# Patient Record
Sex: Female | Born: 1950 | Race: White | Hispanic: No | Marital: Married
Health system: Midwestern US, Community
[De-identification: ages and names within clinical notes are randomized; demographics above are authoritative.]

## PROBLEM LIST (undated history)

## (undated) DIAGNOSIS — G43909 Migraine, unspecified, not intractable, without status migrainosus: Secondary | ICD-10-CM

## (undated) DIAGNOSIS — R002 Palpitations: Secondary | ICD-10-CM

## (undated) DIAGNOSIS — E785 Hyperlipidemia, unspecified: Secondary | ICD-10-CM

## (undated) DIAGNOSIS — I340 Nonrheumatic mitral (valve) insufficiency: Secondary | ICD-10-CM

## (undated) DIAGNOSIS — K589 Irritable bowel syndrome without diarrhea: Secondary | ICD-10-CM

## (undated) DIAGNOSIS — I10 Essential (primary) hypertension: Secondary | ICD-10-CM

## (undated) DIAGNOSIS — E039 Hypothyroidism, unspecified: Secondary | ICD-10-CM

## (undated) DIAGNOSIS — H6090 Unspecified otitis externa, unspecified ear: Secondary | ICD-10-CM

## (undated) DIAGNOSIS — M797 Fibromyalgia: Secondary | ICD-10-CM

## (undated) DIAGNOSIS — M1990 Unspecified osteoarthritis, unspecified site: Secondary | ICD-10-CM

## (undated) DIAGNOSIS — E669 Obesity, unspecified: Secondary | ICD-10-CM

## (undated) DIAGNOSIS — G459 Transient cerebral ischemic attack, unspecified: Secondary | ICD-10-CM

## (undated) DIAGNOSIS — K200 Eosinophilic esophagitis: Secondary | ICD-10-CM

## (undated) DIAGNOSIS — K5792 Diverticulitis of intestine, part unspecified, without perforation or abscess without bleeding: Secondary | ICD-10-CM

## (undated) DIAGNOSIS — K219 Gastro-esophageal reflux disease without esophagitis: Secondary | ICD-10-CM

## (undated) HISTORY — DX: Unspecified osteoarthritis, unspecified site: M19.90

## (undated) HISTORY — DX: Diverticulitis of intestine, part unspecified, without perforation or abscess without bleeding: K57.92

## (undated) HISTORY — DX: Gastro-esophageal reflux disease without esophagitis: K21.9

## (undated) HISTORY — DX: Essential (primary) hypertension: I10

## (undated) HISTORY — DX: Obesity, unspecified: E66.9

## (undated) HISTORY — DX: Eosinophilic esophagitis: K20.0

## (undated) HISTORY — PX: OTHER SURGICAL HISTORY: SHX169

## (undated) HISTORY — DX: Fibromyalgia: M79.7

## (undated) HISTORY — DX: Unspecified otitis externa, unspecified ear: H60.90

## (undated) HISTORY — DX: Nonrheumatic mitral (valve) insufficiency: I34.0

## (undated) HISTORY — PX: TONSILLECTOMY AND ADENOIDECTOMY: SUR1326

## (undated) HISTORY — DX: Hypothyroidism, unspecified: E03.9

## (undated) HISTORY — PX: ABDOMINAL HYSTERECTOMY: SHX81

## (undated) HISTORY — DX: Irritable bowel syndrome, unspecified: K58.9

## (undated) HISTORY — DX: Hyperlipidemia, unspecified: E78.5

---

## 2005-12-29 ENCOUNTER — Encounter: Admission: RE | Admit: 2005-12-29 | Discharge: 2005-12-29 | Payer: Self-pay | Admitting: Family Medicine

## 2006-07-07 ENCOUNTER — Encounter: Admission: RE | Admit: 2006-07-07 | Discharge: 2006-07-07 | Payer: Self-pay | Admitting: Family Medicine

## 2008-04-22 ENCOUNTER — Encounter: Admission: RE | Admit: 2008-04-22 | Discharge: 2008-06-09 | Payer: Self-pay | Admitting: Family Medicine

## 2008-10-09 ENCOUNTER — Encounter: Admission: RE | Admit: 2008-10-09 | Discharge: 2008-10-09 | Payer: Self-pay | Admitting: Family Medicine

## 2009-03-11 ENCOUNTER — Inpatient Hospital Stay (HOSPITAL_COMMUNITY): Admission: EM | Admit: 2009-03-11 | Discharge: 2009-03-12 | Payer: Self-pay | Admitting: Emergency Medicine

## 2009-03-12 ENCOUNTER — Encounter (INDEPENDENT_AMBULATORY_CARE_PROVIDER_SITE_OTHER): Payer: Self-pay | Admitting: Internal Medicine

## 2009-03-17 ENCOUNTER — Encounter: Admission: RE | Admit: 2009-03-17 | Discharge: 2009-03-17 | Payer: Self-pay | Admitting: Family Medicine

## 2009-03-23 ENCOUNTER — Encounter: Admission: RE | Admit: 2009-03-23 | Discharge: 2009-03-23 | Payer: Self-pay | Admitting: Family Medicine

## 2010-05-01 ENCOUNTER — Other Ambulatory Visit: Payer: Self-pay | Admitting: Gastroenterology

## 2010-05-17 ENCOUNTER — Other Ambulatory Visit: Payer: Self-pay

## 2010-07-13 LAB — POCT I-STAT, CHEM 8
BUN: 13 mg/dL (ref 6–23)
Calcium, Ion: 1.16 mmol/L (ref 1.12–1.32)
Chloride: 105 mEq/L (ref 96–112)
Creatinine, Ser: 0.8 mg/dL (ref 0.4–1.2)
Glucose, Bld: 96 mg/dL (ref 70–99)
HCT: 41 % (ref 36.0–46.0)
Hemoglobin: 13.9 g/dL (ref 12.0–15.0)
Potassium: 4.1 mEq/L (ref 3.5–5.1)
Sodium: 140 mEq/L (ref 135–145)
TCO2: 26 mmol/L (ref 0–100)

## 2010-07-13 LAB — CK TOTAL AND CKMB (NOT AT ARMC)
CK, MB: 0.7 ng/mL (ref 0.3–4.0)
Total CK: 61 U/L (ref 7–177)

## 2010-07-13 LAB — CBC
HCT: 39.5 % (ref 36.0–46.0)
Hemoglobin: 13.8 g/dL (ref 12.0–15.0)
MCHC: 35.1 g/dL (ref 30.0–36.0)
MCV: 91.1 fL (ref 78.0–100.0)
Platelets: 197 10*3/uL (ref 150–400)
RBC: 4.33 MIL/uL (ref 3.87–5.11)
RDW: 12.8 % (ref 11.5–15.5)
WBC: 7.7 10*3/uL (ref 4.0–10.5)

## 2010-07-13 LAB — HEMOGLOBIN A1C: Mean Plasma Glucose: 123 mg/dL

## 2010-07-13 LAB — POCT CARDIAC MARKERS
CKMB, poc: <1 ng/mL — ABNORMAL LOW (ref 1.0–8.0)
CKMB, poc: <1 ng/mL — ABNORMAL LOW (ref 1.0–8.0)
Myoglobin, poc: 17.8 ng/mL (ref 12–200)
Myoglobin, poc: 22.6 ng/mL (ref 12–200)
Troponin i, poc: <0.05 ng/mL (ref 0.00–0.09)
Troponin i, poc: <0.05 ng/mL (ref 0.00–0.09)

## 2010-07-13 LAB — LIPID PANEL
HDL: 31 mg/dL — ABNORMAL LOW (ref >39)
Total CHOL/HDL Ratio: 7.3 RATIO
Triglycerides: 201 mg/dL — ABNORMAL HIGH (ref <150)
VLDL: 40 mg/dL (ref 0–40)

## 2010-07-13 LAB — DIFFERENTIAL
Basophils Absolute: 0.1 10*3/uL (ref 0.0–0.1)
Basophils Relative: 1 % (ref 0–1)
Eosinophils Absolute: 0.4 10*3/uL (ref 0.0–0.7)
Eosinophils Relative: 5 % (ref 0–5)
Lymphocytes Relative: 26 % (ref 12–46)
Lymphs Abs: 2 10*3/uL (ref 0.7–4.0)
Monocytes Absolute: 0.5 10*3/uL (ref 0.1–1.0)
Monocytes Relative: 6 % (ref 3–12)
Neutro Abs: 4.8 10*3/uL (ref 1.7–7.7)
Neutrophils Relative %: 62 % (ref 43–77)

## 2010-07-13 LAB — D-DIMER, QUANTITATIVE

## 2010-07-13 LAB — TSH: TSH: 15.239 u[IU]/mL — ABNORMAL HIGH (ref 0.350–4.500)

## 2010-07-13 LAB — TROPONIN I

## 2011-02-11 ENCOUNTER — Other Ambulatory Visit: Payer: Self-pay | Admitting: Family Medicine

## 2011-02-11 DIAGNOSIS — G44009 Cluster headache syndrome, unspecified, not intractable: Secondary | ICD-10-CM

## 2011-02-16 ENCOUNTER — Encounter: Payer: Self-pay | Admitting: Family Medicine

## 2011-02-16 ENCOUNTER — Emergency Department (HOSPITAL_BASED_OUTPATIENT_CLINIC_OR_DEPARTMENT_OTHER)
Admission: EM | Admit: 2011-02-16 | Discharge: 2011-02-16 | Disposition: A | Discharge status: Home / Self Care | Payer: PRIVATE HEALTH INSURANCE | Attending: Emergency Medicine | Admitting: Emergency Medicine

## 2011-02-16 DIAGNOSIS — R109 Unspecified abdominal pain: Secondary | ICD-10-CM | POA: Insufficient documentation

## 2011-02-16 DIAGNOSIS — R197 Diarrhea, unspecified: Secondary | ICD-10-CM | POA: Insufficient documentation

## 2011-02-16 HISTORY — DX: Migraine, unspecified, not intractable, without status migrainosus: G43.909

## 2011-02-16 HISTORY — DX: Palpitations: R00.2

## 2011-02-16 HISTORY — DX: Transient cerebral ischemic attack, unspecified: G45.9

## 2011-02-16 LAB — URINALYSIS, ROUTINE W REFLEX MICROSCOPIC
Bilirubin Urine: NEGATIVE
Glucose, UA: NEGATIVE mg/dL
Hgb urine dipstick: NEGATIVE
Ketones, ur: NEGATIVE mg/dL
Nitrite: NEGATIVE
Protein, ur: NEGATIVE mg/dL
Specific Gravity, Urine: 1.008 (ref 1.005–1.030)
Urobilinogen, UA: 0.2 mg/dL (ref 0.0–1.0)
pH: 7 (ref 5.0–8.0)

## 2011-02-16 LAB — COMPREHENSIVE METABOLIC PANEL
ALT: 16 U/L (ref 0–35)
AST: 12 U/L (ref 0–37)
Albumin: 3.7 g/dL (ref 3.5–5.2)
Alkaline Phosphatase: 75 U/L (ref 39–117)
BUN: 14 mg/dL (ref 6–23)
CO2: 27 mEq/L (ref 19–32)
Calcium: 8.9 mg/dL (ref 8.4–10.5)
Chloride: 104 mEq/L (ref 96–112)
Creatinine, Ser: 0.8 mg/dL (ref 0.50–1.10)
GFR calc Af Amer: >90 mL/min (ref >90)
GFR calc non Af Amer: 80 mL/min — ABNORMAL LOW (ref >90)
Glucose, Bld: 101 mg/dL — ABNORMAL HIGH (ref 70–99)
Potassium: 3.9 mEq/L (ref 3.5–5.1)
Sodium: 137 mEq/L (ref 135–145)
Total Bilirubin: 0.2 mg/dL — ABNORMAL LOW (ref 0.3–1.2)
Total Protein: 7.2 g/dL (ref 6.0–8.3)

## 2011-02-16 LAB — URINE MICROSCOPIC-ADD ON

## 2011-02-16 LAB — DIFFERENTIAL
Basophils Absolute: 0.1 10*3/uL (ref 0.0–0.1)
Basophils Relative: 1 % (ref 0–1)
Eosinophils Absolute: 0.8 10*3/uL — ABNORMAL HIGH (ref 0.0–0.7)
Eosinophils Relative: 9 % — ABNORMAL HIGH (ref 0–5)
Lymphocytes Relative: 33 % (ref 12–46)
Lymphs Abs: 2.9 10*3/uL (ref 0.7–4.0)
Monocytes Absolute: 0.7 10*3/uL (ref 0.1–1.0)
Monocytes Relative: 8 % (ref 3–12)
Neutro Abs: 4.2 10*3/uL (ref 1.7–7.7)
Neutrophils Relative %: 49 % (ref 43–77)

## 2011-02-16 LAB — CBC
HCT: 35.2 % — ABNORMAL LOW (ref 36.0–46.0)
Hemoglobin: 12 g/dL (ref 12.0–15.0)
MCH: 30.2 pg (ref 26.0–34.0)
MCHC: 34.1 g/dL (ref 30.0–36.0)
MCV: 88.4 fL (ref 78.0–100.0)
Platelets: 217 10*3/uL (ref 150–400)
RBC: 3.98 MIL/uL (ref 3.87–5.11)
RDW: 12.2 % (ref 11.5–15.5)
WBC: 8.7 10*3/uL (ref 4.0–10.5)

## 2011-02-16 LAB — LIPASE, BLOOD: Lipase: 45 U/L (ref 11–59)

## 2011-02-16 MED ORDER — SODIUM CHLORIDE 0.9 % IV BOLUS (SEPSIS)
500.0000 mL | Freq: Once | INTRAVENOUS | Status: AC
  Administered 2011-02-16: 16:00:00 via INTRAVENOUS

## 2011-02-16 MED ORDER — SODIUM CHLORIDE 0.9 % IV BOLUS (SEPSIS): 500.0000 mL | Freq: Once | INTRAVENOUS | Status: DC

## 2011-02-16 MED ORDER — ONDANSETRON HCL 4 MG/2ML IJ SOLN
4.0000 mg | Freq: Once | INTRAMUSCULAR | Status: AC
  Administered 2011-02-16: 4 mg via INTRAVENOUS
  Filled 2011-02-16: qty 2

## 2011-02-16 NOTE — ED Notes (Signed)
D/c home with family. No rx given 

## 2011-02-16 NOTE — ED Notes (Addendum)
Pt c/o "watery diarrhea since Thursday night". Pt c/o LUQ tenderness and discomfort. Pt sts diarrhea has improved but "now I feel like I am going to pass out and I'm weak". Dr. Zachery Dauer sent pt to ED for further eval and hydration. Pt sts she had a TIA recently and is scheduled for an MRI tomorrow.

## 2011-02-16 NOTE — ED Notes (Signed)
Warm blankets provided.

## 2011-02-16 NOTE — ED Provider Notes (Addendum)
History     CSN: [id number] Arrival date & time: 02/16/2011  3:45 PM   First MD Initiated Contact with Patient 02/16/11 1551      Chief Complaint  Patient presents with  . Diarrhea  . Abdominal Pain   Patient reports diarrhea over the past several days, worse over the weekend, described as brown and watery, without blood or mucus. The diarrhea has actually eased up and the patient has had no vomiting. However, she states she now feels weak and states that her "legs feel like lead" sharp. Primary care doctor today who instructed her to come to the ER, that she may need hydration. Patient reports subjective fevers at home. She's had no recent travel. However, she was in the hospital last week in [place] where she was treated for a "TIA" patient states her pain is currently 5/10. It is mostly in the left upper quadrant in the lower middle abdomen. She has had a previous hysterectomy in the past (Consider location/radiation/quality/duration/timing/severity/associated sxs/prior treatment) Patient is a 60 y.o. female presenting with diarrhea and abdominal pain.  Diarrhea The primary symptoms include abdominal pain and diarrhea.  Abdominal Pain The primary symptoms of the illness include abdominal pain and diarrhea.    History reviewed. No pertinent past medical history.  No past surgical history on file.  No family history on file.  History  Substance Use Topics  . Smoking status: Not on file  . Smokeless tobacco: Not on file  . Alcohol Use: Not on file    OB History    Grav Para Term Preterm Abortions TAB SAB Ect Mult Living                  Review of Systems  Gastrointestinal: Positive for abdominal pain and diarrhea.  All other systems reviewed and are negative.     Allergies  Review of patient's allergies indicates not on file.  Home Medications  No current outpatient prescriptions on file.  BP 125/80  Pulse 72  Temp 98.2 F (36.8 C)  Resp 16  Ht 5\' 5"   (1.651 m)  Wt 215 lb (97.523 kg)  BMI 35.78 kg/m2  SpO2 100%  Physical Exam  Constitutional: She is oriented to person, place, and time. She appears well-developed and well-nourished. No distress.  HENT:  Head: Normocephalic and atraumatic.       Mucous membranes are moist, no evidence of any cracked lips.  Eyes: Conjunctivae and EOM are normal. Pupils are equal, round, and reactive to light.  Neck: Neck supple.  Cardiovascular: Normal rate and regular rhythm.  Exam reveals no gallop and no friction rub.   No murmur heard. Pulmonary/Chest: Breath sounds normal. She has no wheezes. She has no rales. She exhibits no tenderness.  Abdominal: Soft. Bowel sounds are normal. She exhibits no distension and no mass. There is tenderness. There is no rebound and no guarding.       Mild diffuse tenderness, no rebound or guarding. No focal tenderness. Bowel sounds normal to slightly hypoactive  Musculoskeletal: Normal range of motion.  Neurological: She is alert and oriented to person, place, and time. No cranial nerve deficit. Coordination normal.  Skin: Skin is warm and dry. No rash noted.  Psychiatric: She has a normal mood and affect.    ED Course  Procedures (including critical care time)  Labs Reviewed - No data to display No results found.   No diagnosis found.    MDM  Patient is seen and examined, initial history and  physical is completed. Evaluation initiated  Previous DC summary reviewed.       Almyra Birman A. Patrica Duel, MD 02/16/11 1600  4:34 PM Results for orders placed during the hospital encounter of 02/16/11  CBC      Component Value Range   WBC 8.7  4.0 - 10.5 (K/uL)   RBC 3.98  3.87 - 5.11 (MIL/uL)   Hemoglobin 12.0  12.0 - 15.0 (g/dL)   HCT 16.1 (*) 09.6 - 46.0 (%)   MCV 88.4  78.0 - 100.0 (fL)   MCH 30.2  26.0 - 34.0 (pg)   MCHC 34.1  30.0 - 36.0 (g/dL)   RDW 04.5  40.9 - 81.1 (%)   Platelets 217  150 - 400 (K/uL)  DIFFERENTIAL      Component Value Range    Neutrophils Relative PENDING  43 - 77 (%)   Neutro Abs PENDING  1.7 - 7.7 (K/uL)   Band Neutrophils PENDING  0 - 10 (%)   Lymphocytes Relative PENDING  12 - 46 (%)   Lymphs Abs PENDING  0.7 - 4.0 (K/uL)   Monocytes Relative PENDING  3 - 12 (%)   Monocytes Absolute PENDING  0.1 - 1.0 (K/uL)   Eosinophils Relative PENDING  0 - 5 (%)   Eosinophils Absolute PENDING  0.0 - 0.7 (K/uL)   Basophils Relative PENDING  0 - 1 (%)   Basophils Absolute PENDING  0.0 - 0.1 (K/uL)   WBC Morphology PENDING     RBC Morphology PENDING     Smear Review PENDING     nRBC PENDING  0 (/100 WBC)   Metamyelocytes Relative PENDING     Myelocytes PENDING     Promyelocytes Absolute PENDING     Blasts PENDING     No results found.    Tilla Wilborn A. Patrica Duel, MD 02/16/11 1634  Results for orders placed during the hospital encounter of 02/16/11  CBC      Component Value Range   WBC 8.7  4.0 - 10.5 (K/uL)   RBC 3.98  3.87 - 5.11 (MIL/uL)   Hemoglobin 12.0  12.0 - 15.0 (g/dL)   HCT 91.4 (*) 78.2 - 46.0 (%)   MCV 88.4  78.0 - 100.0 (fL)   MCH 30.2  26.0 - 34.0 (pg)   MCHC 34.1  30.0 - 36.0 (g/dL)   RDW 95.6  21.3 - 08.6 (%)   Platelets 217  150 - 400 (K/uL)  DIFFERENTIAL      Component Value Range   Neutrophils Relative 49  43 - 77 (%)   Lymphocytes Relative 33  12 - 46 (%)   Monocytes Relative 8  3 - 12 (%)   Eosinophils Relative 9 (*) 0 - 5 (%)   Basophils Relative 1  0 - 1 (%)   Neutro Abs 4.2  1.7 - 7.7 (K/uL)   Lymphs Abs 2.9  0.7 - 4.0 (K/uL)   Monocytes Absolute 0.7  0.1 - 1.0 (K/uL)   Eosinophils Absolute 0.8 (*) 0.0 - 0.7 (K/uL)   Basophils Absolute 0.1  0.0 - 0.1 (K/uL)   Smear Review MORPHOLOGY UNREMARKABLE    COMPREHENSIVE METABOLIC PANEL      Component Value Range   Sodium 137  135 - 145 (mEq/L)   Potassium 3.9  3.5 - 5.1 (mEq/L)   Chloride 104  96 - 112 (mEq/L)   CO2 27  19 - 32 (mEq/L)   Glucose, Bld 101 (*) 70 - 99 (mg/dL)   BUN 14  6 -  23 (mg/dL)   Creatinine, Ser 1.61  0.50 - 1.10  (mg/dL)   Calcium 8.9  8.4 - 09.6 (mg/dL)   Total Protein 7.2  6.0 - 8.3 (g/dL)   Albumin 3.7  3.5 - 5.2 (g/dL)   AST 12  0 - 37 (U/L)   ALT 16  0 - 35 (U/L)   Alkaline Phosphatase 75  39 - 117 (U/L)   Total Bilirubin 0.2 (*) 0.3 - 1.2 (mg/dL)   GFR calc non Af Amer 80 (*) >90 (mL/min)   GFR calc Af Amer >90  >90 (mL/min)  LIPASE, BLOOD      Component Value Range   Lipase 45  11 - 59 (U/L)   No results found.    Hal Norrington A. Patrica Duel, MD 02/16/11 1703  5:05 PM Patient is reassessed in room, remained stable. Vital signs stable. Electrolytes normal. Will give additional fluids and reassess, awaiting urinalysis  Mariabelen Pressly A. Patrica Duel, MD 02/16/11 1705

## 2011-02-17 ENCOUNTER — Ambulatory Visit
Admission: RE | Admit: 2011-02-17 | Discharge: 2011-02-17 | Disposition: A | Discharge status: Home / Self Care | Payer: PRIVATE HEALTH INSURANCE | Source: Ambulatory Visit | Attending: Family Medicine | Admitting: Family Medicine

## 2011-02-17 DIAGNOSIS — G44009 Cluster headache syndrome, unspecified, not intractable: Secondary | ICD-10-CM

## 2011-02-17 MED ORDER — GADOBENATE DIMEGLUMINE 529 MG/ML IV SOLN
19.0000 mL | Freq: Once | INTRAVENOUS | Status: AC | PRN
  Administered 2011-02-17: 19 mL via INTRAVENOUS

## 2014-01-30 ENCOUNTER — Inpatient Hospital Stay
Admit: 2014-01-30 | Discharge: 2014-01-30 | Disposition: A | Discharge status: Home / Self Care | Payer: PRIVATE HEALTH INSURANCE | Attending: Emergency Medicine

## 2014-01-30 DIAGNOSIS — R200 Anesthesia of skin: Secondary | ICD-10-CM

## 2014-01-30 LAB — CBC WITH AUTOMATED DIFF
ABS. BASOPHILS: 0 10*3/uL (ref 0.0–0.1)
ABS. EOSINOPHILS: 0.4 10*3/uL (ref 0.0–0.4)
ABS. LYMPHOCYTES: 2.1 10*3/uL (ref 0.8–3.5)
ABS. MONOCYTES: 0.5 10*3/uL (ref 0.0–1.0)
ABS. NEUTROPHILS: 3.3 10*3/uL (ref 1.8–8.0)
BASOPHILS: 1 % (ref 0–1)
EOSINOPHILS: 6 % (ref 0–7)
HCT: 38.2 % (ref 35.0–47.0)
HGB: 13 g/dL (ref 11.5–16.0)
LYMPHOCYTES: 34 % (ref 12–49)
MCH: 30.7 PG (ref 26.0–34.0)
MCHC: 34 g/dL (ref 30.0–36.5)
MCV: 90.1 FL (ref 80.0–99.0)
MONOCYTES: 7 % (ref 5–13)
NEUTROPHILS: 52 % (ref 32–75)
PLATELET: 213 10*3/uL (ref 150–400)
RBC: 4.24 M/uL (ref 3.80–5.20)
RDW: 12.6 % (ref 11.5–14.5)
WBC: 6.4 10*3/uL (ref 3.6–11.0)

## 2014-01-30 LAB — METABOLIC PANEL, COMPREHENSIVE
A-G Ratio: 1.2 (ref 1.1–2.2)
ALT (SGPT): 28 U/L (ref 12–78)
AST (SGOT): 20 U/L (ref 15–37)
Albumin: 4.1 g/dL (ref 3.5–5.0)
Alk. phosphatase: 75 U/L (ref 45–117)
Anion gap: 9 mmol/L (ref 5–15)
BUN/Creatinine ratio: 16 (ref 12–20)
BUN: 14 MG/DL (ref 6–20)
Bilirubin, total: 0.4 MG/DL (ref 0.2–1.0)
CO2: 26 mmol/L (ref 21–32)
Calcium: 8.9 MG/DL (ref 8.5–10.1)
Chloride: 102 mmol/L (ref 97–108)
Creatinine: 0.89 MG/DL (ref 0.55–1.02)
GFR est AA: >60 mL/min/{1.73_m2} (ref >60)
GFR est non-AA: >60 mL/min/{1.73_m2} (ref >60)
Globulin: 3.5 g/dL (ref 2.0–4.0)
Glucose: 97 mg/dL (ref 65–100)
Potassium: 4.1 mmol/L (ref 3.5–5.1)
Protein, total: 7.6 g/dL (ref 6.4–8.2)
Sodium: 137 mmol/L (ref 136–145)

## 2014-01-30 LAB — CK W/ CKMB & INDEX
CK - MB: <0.5 NG/ML — ABNORMAL LOW (ref 0.5–3.6)
CK: 68 U/L (ref 26–192)

## 2014-01-30 LAB — MAGNESIUM: Magnesium: 2.1 mg/dL (ref 1.6–2.4)

## 2014-01-30 LAB — EKG, 12 LEAD, INITIAL
Atrial Rate: 70 {beats}/min
Calculated P Axis: 22 degrees
Calculated T Axis: 22 degrees
Diagnosis: NORMAL
P-R Interval: 146 ms
Q-T Interval: 380 ms
QRS Duration: 78 ms
QTC Calculation (Bezet): 410 ms
Ventricular Rate: 70 {beats}/min

## 2014-01-30 LAB — PROTHROMBIN TIME + INR
INR: 1 (ref 0.9–1.1)
Prothrombin time: 9.6 s (ref 9.0–11.1)

## 2014-01-30 LAB — PTT: aPTT: 24.4 s (ref 22.1–32.5)

## 2014-01-30 LAB — POC TROPONIN-I: Troponin-I (POC): <0.04 ng/mL (ref 0.00–0.08)

## 2014-01-30 LAB — GLUCOSE, POC: Glucose (POC): 95 mg/dL (ref 65–100)

## 2014-01-30 MED ORDER — CIPROFLOXACIN-DEXAMETHASONE 0.3 %-0.1 % EAR DROPS, SUSP
Freq: Two times a day (BID) | OTIC | Status: AC
Start: 2014-01-30 — End: ?

## 2014-01-30 MED ORDER — SODIUM CHLORIDE 0.9% BOLUS IV
0.9 % | Freq: Once | INTRAVENOUS | Status: AC
Start: 2014-01-30 — End: 2014-01-30
  Administered 2014-01-30: 16:00:00 via INTRAVENOUS

## 2014-01-30 MED ORDER — KETOROLAC TROMETHAMINE 30 MG/ML INJECTION
30 mg/mL (1 mL) | INTRAMUSCULAR | Status: AC
Start: 2014-01-30 — End: 2014-01-30
  Administered 2014-01-30: 16:00:00 via INTRAVENOUS

## 2014-01-30 MED ORDER — VALACYCLOVIR 1 G TAB
1 gram | ORAL_TABLET | Freq: Three times a day (TID) | ORAL | Status: AC
Start: 2014-01-30 — End: 2014-02-06

## 2014-01-30 MED ORDER — PREDNISONE 20 MG TAB
20 mg | ORAL_TABLET | Freq: Every day | ORAL | Status: AC
Start: 2014-01-30 — End: 2014-02-06

## 2014-01-30 MED FILL — KETOROLAC TROMETHAMINE 30 MG/ML INJECTION: 30 mg/mL (1 mL) | INTRAMUSCULAR | Qty: 1

## 2014-01-30 MED FILL — SODIUM CHLORIDE 0.9 % IV: INTRAVENOUS | Qty: 1000

## 2014-01-30 NOTE — ED Notes (Signed)
Ambulatory to restroom.

## 2014-01-30 NOTE — ED Notes (Signed)
Pt resting in bed. NAD. VSS. Will continue to monitor.

## 2014-01-30 NOTE — ED Notes (Signed)
Pt brought to ED room by wheel chair. Pt reports R sided facial numbness starting at 0500. Pt also reports HA x1 month described as pressure.

## 2014-01-30 NOTE — ED Provider Notes (Signed)
Patient is a 63 y.o. female presenting with numbness.   Numbness  Associated symptoms include nausea. Pertinent negatives include no shortness of breath, no chest pain and no vomiting.   63 yo WF presents with right facial numbness onset this morning upon awakening at 5am.  Denies pain or weakness.  C/o numbness from forehead to chin on right side of face.  Pt states she has had right ear pain and fullness for the past 3.5 weeks.  Was seen by PCP in [place] and put on zpack to treat sinusitis without improvement in symptoms.  Denies fever, chills, cough, congestion, runny nose, sore throat.  C/o headache to right side of face.  Denies pain but describes a "pressure" 8/10.  Denies neck pain or stiffness.  No rash.  Denies vomiting, diarrhea, cp, sob, cough.  C/o dizziness and nausea.     Pt states several year ago had episode with right sided weakness, was admitted to hospital in [place] and after extensive w/u with MRI, was diagnosed with migraines.  Past Medical History   Diagnosis Date   ??? TIA (transient ischemic attack)    ??? High cholesterol    ??? Hypertension    ??? Mitral valve prolapse    ??? Hyperthyroidism    ??? Migraine         Past Surgical History   Procedure Laterality Date   ??? Hx hysterectomy     ??? Hx carpal tunnel release     ??? Hx orthopaedic           History reviewed. No pertinent family history.     History     Social History   ??? Marital Status: N/A     Spouse Name: N/A     Number of Children: N/A   ??? Years of Education: N/A     Occupational History   ??? Not on file.     Social History Main Topics   ??? Smoking status: Never Smoker    ??? Smokeless tobacco: Not on file   ??? Alcohol Use: No   ??? Drug Use: No   ??? Sexual Activity: Not on file     Other Topics Concern   ??? Not on file     Social History Narrative   ??? No narrative on file                  ALLERGIES: Iodinated contrast media - iv dye; Nuts; Shellfish derived; Bacitracin; Cinnamon; Ginger; Peanut; Penicillins; Raspberry; and Strawberry      Review of Systems    Constitutional: Negative for fever and chills.   HENT: Positive for ear pain. Negative for ear discharge and facial swelling.    Respiratory: Negative for cough and shortness of breath.    Cardiovascular: Negative for chest pain and leg swelling.   Gastrointestinal: Positive for nausea. Negative for vomiting, abdominal pain and diarrhea.   Genitourinary: Negative for dysuria and hematuria.   Musculoskeletal: Negative for back pain, neck pain and neck stiffness.   Skin: Negative for rash.   Neurological: Positive for numbness. Negative for weakness.   Psychiatric/Behavioral: Negative.    All other systems reviewed and are negative.      Filed Vitals:    01/30/14 1020   BP: 135/82   Pulse: 85   Temp: 98 ??F (36.7 ??C)   Resp: 14   Height: 5' 5.5" (1.664 m)   Weight: 95.255 kg (210 lb)   SpO2: 98%  Physical Exam   Physical Examination: General appearance - alert, well appearing, and in no distress, oriented to person, place, and time and normal appearing weight  Eyes - pupils equal and reactive, extraocular eye movements intact  Neck - supple, no significant adenopathy  Chest - clear to auscultation, no wheezes, rales or rhonchi, symmetric air entry  Heart - normal rate, regular rhythm, normal S1, S2, no murmurs, rubs, clicks or gallops  Abdomen - soft, nontender, nondistended, no masses or organomegaly  Back exam - full range of motion, no tenderness, palpable spasm or pain on motion  Neurological - alert, oriented, normal speech, no focal findings or movement disorder noted, normal f-n-f, no nystagmus, no pronator drift  Musculoskeletal - no joint tenderness, deformity or swelling  Extremities - peripheral pulses normal, no pedal edema, no clubbing or cyanosis  Skin - normal coloration and turgor, no rashes, no suspicious skin lesions noted  MDM  Number of Diagnoses or Management Options  Acute otitis externa, right:   Right facial numbness:      Amount and/or Complexity of Data Reviewed   Clinical lab tests: ordered and reviewed  Tests in the radiology section of CPT??: ordered and reviewed  Discuss the patient with other providers: yes (neurology)  Independent visualization of images, tracings, or specimens: yes    Patient Progress  Patient progress: improved      Procedures    EKG interpretation: (Preliminary)  Rhythm: normal sinus rhythm; and regular . Rate (approx.): 70; Axis: normal; P wave: normal; QRS interval: normal ; ST/T wave: normall    12:12 PM  Discussed with Dr. Ivette LoyalGwynn, teleneurology.  Recommends MRI/MRA/MRV.    Labs and imaging unremarkable.  Pt with right facial numbness in setting of right otitis externa, no rash to signify [place].  No venous thrombosis, acute infarction or aneurysm on MRI. Pt to f/u with pcp/neurology or return to ED for worsening symptoms.

## 2014-01-30 NOTE — ED Notes (Signed)
The patient was discharged home by ER MD Leonard SchwartzHoyt and Nurse Noelle PennerGibbs in stable condition. The patient is alert and oriented, is in no respiratory distress and has vital signs within normal limits . The patient's diagnosis, condition and treatment were explained to patient or parent/guardian. The patient/responsible party expressed understanding. 3 prescriptions given to pt. No work/school note given to pt. A discharge plan has been developed. A case manager was not involved in the process. Aftercare instructions were given to the patient.

## 2014-02-10 ENCOUNTER — Telehealth: Payer: Self-pay | Admitting: Cardiology

## 2014-02-10 ENCOUNTER — Observation Stay (HOSPITAL_COMMUNITY)
Admission: EM | Admit: 2014-02-10 | Discharge: 2014-02-11 | Disposition: A | Discharge status: Home / Self Care | Payer: PRIVATE HEALTH INSURANCE | Attending: Internal Medicine | Admitting: Internal Medicine

## 2014-02-10 ENCOUNTER — Encounter (HOSPITAL_COMMUNITY): Payer: Self-pay | Admitting: Emergency Medicine

## 2014-02-10 ENCOUNTER — Emergency Department (HOSPITAL_COMMUNITY): Payer: PRIVATE HEALTH INSURANCE

## 2014-02-10 DIAGNOSIS — Z79899 Other long term (current) drug therapy: Secondary | ICD-10-CM | POA: Insufficient documentation

## 2014-02-10 DIAGNOSIS — Z8673 Personal history of transient ischemic attack (TIA), and cerebral infarction without residual deficits: Secondary | ICD-10-CM | POA: Insufficient documentation

## 2014-02-10 DIAGNOSIS — I10 Essential (primary) hypertension: Secondary | ICD-10-CM | POA: Diagnosis not present

## 2014-02-10 DIAGNOSIS — K219 Gastro-esophageal reflux disease without esophagitis: Secondary | ICD-10-CM | POA: Insufficient documentation

## 2014-02-10 DIAGNOSIS — Z792 Long term (current) use of antibiotics: Secondary | ICD-10-CM | POA: Diagnosis not present

## 2014-02-10 DIAGNOSIS — R079 Chest pain, unspecified: Principal | ICD-10-CM | POA: Diagnosis present

## 2014-02-10 DIAGNOSIS — E785 Hyperlipidemia, unspecified: Secondary | ICD-10-CM | POA: Diagnosis not present

## 2014-02-10 LAB — CBC
HCT: 36.2 % (ref 36.0–46.0)
HCT: 36.2 % (ref 36.0–46.0)
Hemoglobin: 12.3 g/dL (ref 12.0–15.0)
Hemoglobin: 12.6 g/dL (ref 12.0–15.0)
MCH: 30.6 pg (ref 26.0–34.0)
MCH: 30.6 pg (ref 26.0–34.0)
MCHC: 34 g/dL (ref 30.0–36.0)
MCHC: 34.8 g/dL (ref 30.0–36.0)
MCV: 90 fL (ref 78.0–100.0)
MCV: 87.9 fL (ref 78.0–100.0)
Platelets: 185 10*3/uL (ref 150–400)
Platelets: 194 10*3/uL (ref 150–400)
RBC: 4.02 MIL/uL (ref 3.87–5.11)
RBC: 4.12 MIL/uL (ref 3.87–5.11)
RDW: 13.2 % (ref 11.5–15.5)
RDW: 13.1 % (ref 11.5–15.5)
WBC: 6 10*3/uL (ref 4.0–10.5)
WBC: 6.4 10*3/uL (ref 4.0–10.5)

## 2014-02-10 LAB — TROPONIN I
Troponin I: <0.3 ng/mL (ref <0.30)
Troponin I: <0.3 ng/mL (ref <0.30)

## 2014-02-10 LAB — BASIC METABOLIC PANEL
Anion gap: 12 (ref 5–15)
BUN: 12 mg/dL (ref 6–23)
CO2: 24 mEq/L (ref 19–32)
Calcium: 8.9 mg/dL (ref 8.4–10.5)
Chloride: 107 mEq/L (ref 96–112)
Creatinine, Ser: 0.8 mg/dL (ref 0.50–1.10)
GFR calc Af Amer: >90 mL/min (ref >90)
GFR calc non Af Amer: 78 mL/min — ABNORMAL LOW (ref >90)
Glucose, Bld: 93 mg/dL (ref 70–99)
Potassium: 4.4 mEq/L (ref 3.7–5.3)
Sodium: 143 mEq/L (ref 137–147)

## 2014-02-10 LAB — D-DIMER, QUANTITATIVE: D-Dimer, Quant: <0.27 ug/mL-FEU (ref 0.00–0.48)

## 2014-02-10 LAB — HEPATIC FUNCTION PANEL
ALT: 21 U/L (ref 0–35)
AST: 18 U/L (ref 0–37)
Albumin: 3.5 g/dL (ref 3.5–5.2)
Alkaline Phosphatase: 61 U/L (ref 39–117)
Bilirubin, Direct: <0.2 mg/dL (ref 0.0–0.3)
Total Bilirubin: 0.4 mg/dL (ref 0.3–1.2)
Total Protein: 6.6 g/dL (ref 6.0–8.3)

## 2014-02-10 LAB — CREATININE, SERUM
Creatinine, Ser: 0.75 mg/dL (ref 0.50–1.10)
GFR calc Af Amer: >90 mL/min (ref >90)
GFR calc non Af Amer: 90 mL/min — ABNORMAL LOW (ref >90)

## 2014-02-10 LAB — TSH: TSH: 1.41 u[IU]/mL (ref 0.350–4.500)

## 2014-02-10 LAB — I-STAT TROPONIN, ED: Troponin i, poc: 0.02 ng/mL (ref 0.00–0.08)

## 2014-02-10 MED ORDER — LEVALBUTEROL HCL 0.63 MG/3ML IN NEBU
0.6300 mg | INHALATION_SOLUTION | Freq: Four times a day (QID) | RESPIRATORY_TRACT | Status: DC | PRN
  Filled 2014-02-10: qty 3

## 2014-02-10 MED ORDER — LEVOTHYROXINE SODIUM 150 MCG PO TABS
150.0000 ug | ORAL_TABLET | Freq: Every day | ORAL | Status: DC
  Administered 2014-02-11: 150 ug via ORAL
  Filled 2014-02-10 (×3): qty 1

## 2014-02-10 MED ORDER — METOPROLOL TARTRATE 12.5 MG HALF TABLET
12.5000 mg | ORAL_TABLET | Freq: Every day | ORAL | Status: DC
  Administered 2014-02-10 – 2014-02-11 (×2): 12.5 mg via ORAL
  Filled 2014-02-10 (×2): qty 1

## 2014-02-10 MED ORDER — ACETAMINOPHEN 650 MG RE SUPP: 650.0000 mg | Freq: Four times a day (QID) | RECTAL | Status: DC | PRN

## 2014-02-10 MED ORDER — LEVOTHYROXINE SODIUM 150 MCG PO TABS: 150.0000 ug | ORAL_TABLET | Freq: Every day | ORAL | Status: DC

## 2014-02-10 MED ORDER — NITROGLYCERIN 0.4 MG SL SUBL
0.4000 mg | SUBLINGUAL_TABLET | SUBLINGUAL | Status: DC | PRN
  Administered 2014-02-10: 0.4 mg via SUBLINGUAL
  Filled 2014-02-10: qty 1

## 2014-02-10 MED ORDER — ACETAMINOPHEN 325 MG PO TABS
650.0000 mg | ORAL_TABLET | Freq: Four times a day (QID) | ORAL | Status: DC | PRN
  Administered 2014-02-10: 650 mg via ORAL
  Filled 2014-02-10: qty 2

## 2014-02-10 MED ORDER — SODIUM CHLORIDE 0.9 % IJ SOLN
3.0000 mL | Freq: Two times a day (BID) | INTRAMUSCULAR | Status: DC
  Administered 2014-02-10: 3 mL via INTRAVENOUS

## 2014-02-10 MED ORDER — ENOXAPARIN SODIUM 40 MG/0.4ML ~~LOC~~ SOLN
40.0000 mg | SUBCUTANEOUS | Status: DC
  Administered 2014-02-10: 40 mg via SUBCUTANEOUS
  Filled 2014-02-10 (×2): qty 0.4

## 2014-02-10 MED ORDER — HYDROMORPHONE HCL 1 MG/ML IJ SOLN: 0.5000 mg | INTRAMUSCULAR | Status: DC | PRN

## 2014-02-10 MED ORDER — ASPIRIN EC 81 MG PO TBEC
81.0000 mg | DELAYED_RELEASE_TABLET | Freq: Every day | ORAL | Status: DC
  Administered 2014-02-10 – 2014-02-11 (×2): 81 mg via ORAL
  Filled 2014-02-10 (×2): qty 1

## 2014-02-10 MED ORDER — ONDANSETRON HCL 4 MG/2ML IJ SOLN
4.0000 mg | Freq: Four times a day (QID) | INTRAMUSCULAR | Status: DC | PRN
  Administered 2014-02-10: 4 mg via INTRAVENOUS
  Filled 2014-02-10: qty 2

## 2014-02-10 NOTE — Progress Notes (Signed)
Dr. Susie CassetteAbrol paged, pt request for prn nausea med. Darrel HooverWilson,Faigy Stretch S 4:57 PM

## 2014-02-10 NOTE — ED Provider Notes (Signed)
CSN: [id number]     Arrival date & time 02/10/14  1028 History   First MD Initiated Contact with Patient 02/10/14 1038     Chief Complaint  Patient presents with  . Chest Pain     (Consider location/radiation/quality/duration/timing/severity/associated sxs/prior Treatment) HPI Comments: This is a 63 year old female with a past medical history of hypertension, palpitations, hyperlipidemia, thyroid disease, TIA, migraines, GERD and fibromyalgia who presents to the emergency department with her husband complaining of sudden onset chest pain beginning around 3:30 AM today waking her up from sleep. Pain has been constant since, described as a house sitting on her chest. For the first hour of her symptoms, she had radiation down her left arm and into her right jaw which has since subsided.denies shortness of breath, however reports the "pain takes my breath away". States she is feeling weak. Denies lightheadedness, dizziness, nausea, vomiting or diaphoresis. States she had similar symptoms about one month ago, however did not get  Evaluated. A week and a half ago she was diagnosed with right-sided Bell's palsy, was started on Ciprodex and states her symptoms have completely resolved. Denies history of heart attack.states she had a viral upper respiratory infection a few weeks ago. Denies cough, fever or chills. She sees Dr. Mayford Knife for cardiology, however has not seen her in about 3 years.  Patient is a 63 y.o. female presenting with chest pain. The history is provided by the patient.  Chest Pain Associated symptoms: fatigue and weakness     Past Medical History  Diagnosis Date  . Migraines   . TIA (transient ischemic attack)   . Thyroid disease   . Palpitations   . Hypertension   . Hyperlipidemia   . Fibromyalgia   . IBS (irritable bowel syndrome)   . GERD (gastroesophageal reflux disease)   . Palpitations   . Diverticulitis   . Otitis externa   . Osteoarthritis    Past Surgical History   Procedure Laterality Date  . Abdominal hysterectomy    . Carpel tunnel    . Lbp gso     History reviewed. No pertinent family history. History  Substance Use Topics  . Smoking status: Never Smoker   . Smokeless tobacco: Not on file  . Alcohol Use: No   OB History    No data available     Review of Systems  Constitutional: Positive for fatigue.  Cardiovascular: Positive for chest pain.  Neurological: Positive for weakness.  All other systems reviewed and are negative.     Allergies  Ivp dye; Shellfish allergy; Thimerosol; Avelox; Bacitracin; Cinnamon; Ginger; Other; Sunscreens; Cephalosporins; Codeine; and Penicillins  Home Medications   Prior to Admission medications   Medication Sig Start Date End Date Taking? Authorizing Provider  acetaminophen (TYLENOL) 500 MG tablet Take 1,000 mg by mouth every 6 (six) hours as needed for mild pain or headache.   Yes Historical Provider, MD  ciprofloxacin-dexamethasone (CIPRODEX) otic suspension Place 4 drops into the right ear 2 (two) times daily.   Yes Historical Provider, MD  EPINEPHrine (EPIPEN) 0.3 mg/0.3 mL IJ SOAJ injection Inject into the muscle once.   Yes Historical Provider, MD  levothyroxine (SYNTHROID, LEVOTHROID) 150 MCG tablet Take 150 mcg by mouth daily before breakfast.   Yes Historical Provider, MD  metoprolol tartrate (LOPRESSOR) 25 MG tablet Take 12.5 mg by mouth daily.    Yes Historical Provider, MD  acetaminophen (TYLENOL) 650 MG CR tablet Take 650 mg by mouth every 8 (eight) hours as needed for pain.  Historical Provider, MD  diphenhydrAMINE (BENADRYL) 12.5 MG chewable tablet Chew 12.5 mg by mouth 4 (four) times daily as needed for allergies.    Historical Provider, MD  levothyroxine (SYNTHROID) 175 MCG tablet Take 150 mcg by mouth daily.     Historical Provider, MD   BP 113/73 mmHg  Pulse 64  Temp(Src) 98 F (36.7 C) (Oral)  Resp 13  Ht 5' 5.5" (1.664 m)  Wt 212 lb (96.163 kg)  BMI 34.73 kg/m2  SpO2  100% Physical Exam  Constitutional: She is oriented to person, place, and time. She appears well-developed and well-nourished. No distress.  HENT:  Head: Normocephalic and atraumatic.  Mouth/Throat: Oropharynx is clear and moist.  Eyes: Conjunctivae and EOM are normal. Pupils are equal, round, and reactive to light.  Neck: Normal range of motion. Neck supple. No JVD present.  Cardiovascular: Normal rate, regular rhythm, normal heart sounds and intact distal pulses.   No extremity edema.  Pulmonary/Chest: Effort normal and breath sounds normal. No respiratory distress.  Abdominal: Soft. Bowel sounds are normal. There is no tenderness.  Musculoskeletal: Normal range of motion. She exhibits no edema.  Neurological: She is alert and oriented to person, place, and time. She has normal strength. No sensory deficit.  Speech fluent, goal oriented. Moves limbs without ataxia. Equal grip strength bilateral.  Skin: Skin is warm and dry. She is not diaphoretic.  Psychiatric: She has a normal mood and affect. Her behavior is normal.  Nursing note and vitals reviewed.   ED Course  Procedures (including critical care time) Labs Review Labs Reviewed  BASIC METABOLIC PANEL - Abnormal; Notable for the following:    GFR calc non Af Amer 78 (*)    All other components within normal limits  CBC  CBC  CREATININE, SERUM  HEPATIC FUNCTION PANEL  TSH  TROPONIN I  TROPONIN I  TROPONIN I  HEMOGLOBIN A1C  D-DIMER, QUANTITATIVE  I-STAT TROPOININ, ED    Imaging Review Dg Chest 2 View  02/10/2014   CLINICAL DATA:  Chest pain and shortness of breath since earlier in the day  EXAM: CHEST  2 VIEW  COMPARISON:  June 12, 2012 there is stable scarring in the left base. Elsewhere lungs are clear. Heart size and pulmonary vascularity are normal. No adenopathy. No pneumothorax. No bone lesions.  FINDINGS: Stable scarring left base.  No edema or consolidation.  IMPRESSION: No active cardiopulmonary disease.    Electronically Signed   By: Bretta BangWilliam  Woodruff M.D.   On: 02/10/2014 11:16     EKG Interpretation   Date/Time:  Monday February 10 2014 10:34:45 EST Ventricular Rate:  83 PR Interval:  132 QRS Duration: 72 QT Interval:  358 QTC Calculation: 420 R Axis:   25 Text Interpretation:  Normal sinus rhythm Nonspecific ST abnormality  Abnormal ECG No previous ECGs available Confirmed by Bebe ShaggyWICKLINE  MD, DONALD  [phone]([id number]) on 02/10/2014 10:55:00 AM      MDM   Final diagnoses:  Chest pain  Chest pain   Patient presenting with chest pain, waking her up from sleep, radiating to her left arm and jaw. She is nontoxic appearing and in no apparent distress. Afebrile, vital signs stable. No history of MI. EKG without any acute findings. Troponin within normal limits. Chest x-ray clear. Symptoms significantly improved with sublingual nitroglycerin. History concerning for cardiac in origin, will admit for observation. Admission accepted by Dr[facility] Sus[facility]Abrol, [facility].  Discussed with attending Dr. Bebe ShaggyWickline who agrees with plan of care.   Nada Boozerobyn M  Jennea Rager, PA-C 02/10/14 1242

## 2014-02-10 NOTE — Telephone Encounter (Signed)
New message    Husband calling stating it's been a while since his wife seen Dr.Turner .    Patient C/O chest pain -  more pressure -feels like an elephant or truck on chest.    since last night. -  down left side - arm.     Pain level now  5-6-

## 2014-02-10 NOTE — Telephone Encounter (Signed)
Patient reports that she was awakened at 3:00 am with chest pain, pressure, like "a truck sitting on my chest". Has seen dr. Mayford Knifeurner and [facility] Eld[facility], but we do not have access to those records. Pain is on left side of jaw, and down her left arm. No diaphoresis, no n/vomiting, just feels weak, and shaky a little dizzy and sl sob. Also states pain in left upper back. Was recently treated for a viral infection, but denies fever, chills, sore throat or cough. Advised she needed to be evaluated at ED, and patient is agreeable. She has someone to drive her.

## 2014-02-10 NOTE — H&P (Signed)
[facility] History and Physical  Heather Carey ZOX:[id number] DOB: 1950/07/06 DOA: 02/10/2014  Referring physician: ED  PCP: Gretel Acre, MD   Chief Complaint: chest pain HPI:  63 year old female with a past medical history of hypertension, palpitations, hyperlipidemia, thyroid disease, TIA, migraines, GERD and fibromyalgia who presents to the emergency department with her husband complaining of sudden onset chest pain beginning around 3:30 AM today waking her up from sleep. Pain has been constant since, described as a house sitting on her chest. For the first hour of her symptoms, she had radiation down her left arm and into her right jaw which has since subsided.denies shortness of breath, however reports the "pain takes my breath away".States she had similar symptoms about one month ago, however did not get Evaluated. A week and a half ago she was diagnosed with right-sided Bell's palsy, was started on Ciprodex and states her symptoms have completely resolved. Denies history of heart attack.states she had a viral upper respiratory infection a few weeks ago. Denies cough, fever or chills. She sees Dr. Mayford Knife for cardiology for history of mitral valve prolapse, however has not seen her in about 3 years. EKG without any acute findings. Troponin within normal limits. Chest x-ray clear. Symptoms significantly improved with sublingual nitroglycerin. History concerning for cardiac in origin, will admit for observation.      Review of Systems: negative for the following  Constitutional: Denies fever, chills, diaphoresis, appetite change and fatigue.  HEENT: Denies photophobia, eye pain, redness, hearing loss, ear pain, congestion, sore throat, rhinorrhea, sneezing, mouth sores, trouble swallowing, neck pain, neck stiffness and tinnitus.  Respiratory: Denies SOB, DOE, cough, chest tightness, and wheezing.  Cardiovascular:Positive for chest pain,palpitations and leg swelling.  Gastrointestinal:  Denies nausea, vomiting, abdominal pain, diarrhea, constipation, blood in stool and abdominal distention.  Genitourinary: Denies dysuria, urgency, frequency, hematuria, flank pain and difficulty urinating.  Musculoskeletal: Denies myalgias, back pain, joint swelling, arthralgias and gait problem.  Skin: Denies pallor, rash and wound.  Neurological: Denies dizziness, seizures, syncope, weakness, light-headedness, numbness and headaches.  Hematological: Denies adenopathy. Easy bruising, personal or family bleeding history  Psychiatric/Behavioral: Denies suicidal ideation, mood changes, confusion, nervousness, sleep disturbance and agitation       Past Medical History  Diagnosis Date  . Migraines   . TIA (transient ischemic attack)   . Thyroid disease   . Palpitations   . Hyperlipidemia   . Fibromyalgia   . IBS (irritable bowel syndrome)   . GERD (gastroesophageal reflux disease)   . Palpitations   . Diverticulitis   . Otitis externa   . Osteoarthritis      Past Surgical History  Procedure Laterality Date  . Abdominal hysterectomy    . Carpel tunnel    . Lbp gso        Social History:  reports that she has never smoked. She does not have any smokeless tobacco history on file. She reports that she does not drink alcohol or use illicit drugs.    Allergies  Allergen Reactions  . Ivp Dye [Iodinated Diagnostic Agents] Anaphylaxis  . Shellfish Allergy Anaphylaxis  . Thimerosol [Thimerosal] Anaphylaxis  . Avelox [Moxifloxacin Hcl In Nacl]     Irregular heart beat  . Bacitracin Itching and Other (See Comments)    Blisters on skin   . Cinnamon Swelling  . Ginger Swelling  . Other Swelling    Nuts and ginger    . Sunscreens Swelling  . Cephalosporins Rash  . Codeine Rash  . Penicillins  Rash    History reviewed. No pertinent family history.   Prior to Admission medications   Medication Sig Start Date End Date Taking? Authorizing Provider  acetaminophen (TYLENOL)  500 MG tablet Take 1,000 mg by mouth every 6 (six) hours as needed for mild pain or headache.   Yes Historical Provider, MD  ciprofloxacin-dexamethasone (CIPRODEX) otic suspension Place 4 drops into the right ear 2 (two) times daily.   Yes Historical Provider, MD  EPINEPHrine (EPIPEN) 0.3 mg/0.3 mL IJ SOAJ injection Inject into the muscle once.   Yes Historical Provider, MD  levothyroxine (SYNTHROID, LEVOTHROID) 150 MCG tablet Take 150 mcg by mouth daily before breakfast.   Yes Historical Provider, MD  metoprolol tartrate (LOPRESSOR) 25 MG tablet Take 12.5 mg by mouth daily.    Yes Historical Provider, MD  acetaminophen (TYLENOL) 650 MG CR tablet Take 650 mg by mouth every 8 (eight) hours as needed for pain.    Historical Provider, MD  diphenhydrAMINE (BENADRYL) 12.5 MG chewable tablet Chew 12.5 mg by mouth 4 (four) times daily as needed for allergies.    Historical Provider, MD  levothyroxine (SYNTHROID) 175 MCG tablet Take 150 mcg by mouth daily.     Historical Provider, MD     Physical Exam: Filed Vitals:   02/10/14 1245 02/10/14 1300 02/10/14 1314 02/10/14 1412  BP: 115/68 113/80 113/80 126/74  Pulse: 77 73 75 72  Temp:    98.2 F (36.8 C)  TempSrc:    Oral  Resp: 13 10 13 16   Height:      Weight:    98.068 kg (216 lb 3.2 oz)  SpO2: 98% 98% 98% 99%     Constitutional: Vital signs reviewed. Patient is a well-developed and well-nourished in no acute distress and cooperative with exam. Alert and oriented x3.  Head: Normocephalic and atraumatic  Ear: TM normal bilaterally  Mouth: no erythema or exudates, MMM  Eyes: PERRL, EOMI, conjunctivae normal, No scleral icterus.  Neck: Supple, Trachea midline normal ROM, No JVD, mass, thyromegaly, or carotid bruit present.  Cardiovascular: RRR, S1 normal, S2 normal, no MRG, pulses symmetric and intact bilaterally  Pulmonary/Chest: CTAB, no wheezes, rales, or rhonchi  Abdominal: Soft. Non-tender, non-distended, bowel sounds are normal, no  masses, organomegaly, or guarding present.  GU: no CVA tenderness Musculoskeletal: No joint deformities, erythema, or stiffness, ROM full and no nontender Ext: no edema and no cyanosis, pulses palpable bilaterally (DP and PT)  Hematology: no cervical, inginal, or axillary adenopathy.  Neurological: A&O x3, Strenght is normal and symmetric bilaterally, cranial nerve II-XII are grossly intact, no focal motor deficit, sensory intact to light touch bilaterally.  Skin: Warm, dry and intact. No rash, cyanosis, or clubbing.  Psychiatric: Normal mood and affect. speech and behavior is normal. Judgment and thought content normal. Cognition and memory are normal.       Labs on Admission:    Basic Metabolic Panel:  Recent Labs Lab 02/10/14 1138  NA 143  K 4.4  CL 107  CO2 24  GLUCOSE 93  BUN 12  CREATININE 0.80  CALCIUM 8.9   Liver Function Tests: No results for input(s): AST, ALT, ALKPHOS, BILITOT, PROT, ALBUMIN in the last 168 hours. No results for input(s): LIPASE, AMYLASE in the last 168 hours. No results for input(s): AMMONIA in the last 168 hours. CBC:  Recent Labs Lab 02/10/14 1138 02/10/14 1604  WBC 6.0 6.4  HGB 12.3 12.6  HCT 36.2 36.2  MCV 90.0 87.9  PLT 185 194  Cardiac Enzymes: No results for input(s): CKTOTAL, CKMB, CKMBINDEX, TROPONINI in the last 168 hours.  BNP (last 3 results) No results for input(s): PROBNP in the last 8760 hours.    CBG: No results for input(s): GLUCAP in the last 168 hours.  Radiological Exams on Admission: Dg Chest 2 View  02/10/2014   CLINICAL DATA:  Chest pain and shortness of breath since earlier in the day  EXAM: CHEST  2 VIEW  COMPARISON:  June 12, 2012 there is stable scarring in the left base. Elsewhere lungs are clear. Heart size and pulmonary vascularity are normal. No adenopathy. No pneumothorax. No bone lesions.  FINDINGS: Stable scarring left base.  No edema or consolidation.  IMPRESSION: No active cardiopulmonary  disease.   Electronically Signed   By: Bretta BangWilliam  Woodruff M.D.   On: 02/10/2014 11:16    EKG: Independently reviewed. Date/Time: Monday February 10 2014 10:34:45 EST Ventricular Rate: 83 PR Interval: 132 QRS Duration: 72 QT Interval: 358 QTC Calculation: 420 R Axis: 25 Text Interpretation: Normal sinus rhythm Nonspecific ST abnormality   Assessment/Plan Active Problems:   Chest pain  Chest pain Atypical for cardiac pain Given recent history of a viral infection most likely has pleurisy vs esophageal spasm secondary to gastroesophageal reflux D-dimer negative Patient will be admitted to telemetry for cardiac monitoring Cycle cardiac enzymes 2-D echo to rule out pericardial effusion Stress Myoview in a.m. Cardiology notified and will monitor without a formal consultation   Hypertension Patient states that she takes metoprolol because of her history of mitral valve prolapse  Hypothyroidism B check TSH Continue Synthroid    Code Status:   full Family Communication: bedside Disposition Plan: admit   Time spent: 70 mins   [facility],Bernie Ransford [facility] Pager [phone]  If 7PM-7AM, please contact night-coverage [email or web address] Password [email or web address] 02/10/2014, 5:00 PM

## 2014-02-10 NOTE — ED Notes (Signed)
Pt c/o CP with SOB that radiated to left arm and jaw that woke pt from sleep today; pt sts hx of same x 1 within month

## 2014-02-10 NOTE — ED Notes (Signed)
Attempted report. No answer. 

## 2014-02-11 ENCOUNTER — Other Ambulatory Visit (HOSPITAL_COMMUNITY): Payer: PRIVATE HEALTH INSURANCE

## 2014-02-11 ENCOUNTER — Encounter (HOSPITAL_COMMUNITY): Payer: PRIVATE HEALTH INSURANCE

## 2014-02-11 ENCOUNTER — Observation Stay (HOSPITAL_COMMUNITY): Payer: PRIVATE HEALTH INSURANCE

## 2014-02-11 DIAGNOSIS — R079 Chest pain, unspecified: Secondary | ICD-10-CM

## 2014-02-11 DIAGNOSIS — R0789 Other chest pain: Secondary | ICD-10-CM

## 2014-02-11 LAB — COMPREHENSIVE METABOLIC PANEL
ALT: 19 U/L (ref 0–35)
AST: 15 U/L (ref 0–37)
Albumin: 3.3 g/dL — ABNORMAL LOW (ref 3.5–5.2)
Alkaline Phosphatase: 59 U/L (ref 39–117)
Anion gap: 10 (ref 5–15)
BUN: 12 mg/dL (ref 6–23)
CO2: 25 mEq/L (ref 19–32)
Calcium: 8.7 mg/dL (ref 8.4–10.5)
Chloride: 105 mEq/L (ref 96–112)
Creatinine, Ser: 0.77 mg/dL (ref 0.50–1.10)
GFR calc Af Amer: >90 mL/min (ref >90)
GFR calc non Af Amer: 89 mL/min — ABNORMAL LOW (ref >90)
Glucose, Bld: 102 mg/dL — ABNORMAL HIGH (ref 70–99)
Potassium: 3.9 mEq/L (ref 3.7–5.3)
Sodium: 140 mEq/L (ref 137–147)
Total Bilirubin: 0.3 mg/dL (ref 0.3–1.2)
Total Protein: 6.4 g/dL (ref 6.0–8.3)

## 2014-02-11 LAB — CBC
HCT: 34.9 % — ABNORMAL LOW (ref 36.0–46.0)
Hemoglobin: 11.9 g/dL — ABNORMAL LOW (ref 12.0–15.0)
MCH: 30.2 pg (ref 26.0–34.0)
MCHC: 34.1 g/dL (ref 30.0–36.0)
MCV: 88.6 fL (ref 78.0–100.0)
Platelets: 201 10*3/uL (ref 150–400)
RBC: 3.94 MIL/uL (ref 3.87–5.11)
RDW: 13.1 % (ref 11.5–15.5)
WBC: 6.6 10*3/uL (ref 4.0–10.5)

## 2014-02-11 LAB — HEMOGLOBIN A1C
Hgb A1c MFr Bld: 6 % — ABNORMAL HIGH (ref <5.7)
Mean Plasma Glucose: 126 mg/dL — ABNORMAL HIGH (ref <117)

## 2014-02-11 LAB — TROPONIN I: Troponin I: <0.3 ng/mL (ref <0.30)

## 2014-02-11 MED ORDER — PANTOPRAZOLE SODIUM 40 MG PO TBEC: 40.0000 mg | DELAYED_RELEASE_TABLET | Freq: Every day | ORAL | Status: DC

## 2014-02-11 MED ORDER — TECHNETIUM TC 99M SESTAMIBI GENERIC - CARDIOLITE
30.0000 | Freq: Once | INTRAVENOUS | Status: AC | PRN
  Administered 2014-02-11: 30 via INTRAVENOUS

## 2014-02-11 MED ORDER — METOPROLOL SUCCINATE 12.5 MG HALF TABLET: 12.5000 mg | ORAL_TABLET | Freq: Every day | ORAL | Status: DC

## 2014-02-11 MED ORDER — TECHNETIUM TC 99M SESTAMIBI GENERIC - CARDIOLITE
10.0000 | Freq: Once | INTRAVENOUS | Status: AC | PRN
  Administered 2014-02-11: 10 via INTRAVENOUS

## 2014-02-11 NOTE — Progress Notes (Signed)
UR completed 

## 2014-02-11 NOTE — Progress Notes (Signed)
Pt discharged home due to emergency family situation  Discharge instructions given & reviewed Eduction discussed  IV dc'd  Tele dc'd  Pt discharged via wheelchair with student nurse.  Darrel HooverWilson,Juston Goheen S 3:26 PM

## 2014-02-11 NOTE — Progress Notes (Signed)
     The patient was seen in nuclear medicine for a Ett myoview. She tolerated the procedure well. She walked 7min 40 sec on Bruce protocol and exceeded her target HR of 135. No CP. No acute ST or TW changes on ECG.    Thereasa ParkinKathryn Stern PA-C  [facility]

## 2014-02-11 NOTE — Plan of Care (Signed)
Problem: Phase I Progression Outcomes Goal: Anginal pain relieved Outcome: Completed/Met Date Met:  02/11/14 Goal: Aspirin unless contraindicated Outcome: Completed/Met Date Met:  02/11/14 Goal: Voiding-avoid urinary catheter unless indicated Outcome: Completed/Met Date Met:  02/11/14

## 2014-02-11 NOTE — Discharge Summary (Signed)
Physician Discharge Summary  Heather Carey [id number]:[id number] DOB: 07-28-1950 DOA: 02/10/2014  PCP: Gretel Acre, MD  Admit date: 02/10/2014 Discharge date: 02/11/2014  Time spent: 30 minutes  Recommendations for Outpatient Follow-up:  Follow  Up with PCP in one week. Discharge Diagnoses:  Active Problems:   Chest pain   Discharge Condition: improved.   Diet recommendation: regular   Filed Weights   02/10/14 1038 02/10/14 1412  Weight: 96.163 kg (212 lb) 98.068 kg (216 lb 3.2 oz)    History of present illness/ hospital course:  63 year old female with a past medical history of hypertension, palpitations, hyperlipidemia, thyroid disease, TIA, migraines, GERD and fibromyalgia who presents to the emergency department with her husband complaining of sudden onset chest pressure and sob. She was admitted to medical service and nuclear stress ordered. It was negative for any ischemia. She was discharged home to follow up with PCP in one week.     Procedures:  Stress nuclear test.   Consultations:  none  Discharge Exam: Filed Vitals:   02/11/14 1413  BP: 104/57  Pulse: 65  Temp: 98.3 F (36.8 C)  Resp: 20    General: alert afebrile comfortable Cardiovascular: s1s2 Respiratory: ctab  Discharge Instructions You were cared for by a hospitalist during your hospital stay. If you have any questions about your discharge medications or the care you received while you were in the hospital after you are discharged, you can call the unit and asked to speak with the hospitalist on call if the hospitalist that took care of you is not available. Once you are discharged, your primary care physician will handle any further medical issues. Please note that NO REFILLS for any discharge medications will be authorized once you are discharged, as it is imperative that you return to your primary care physician (or establish a relationship with a primary care physician if you do not have one) for your  aftercare needs so that they can reassess your need for medications and monitor your lab values.  Discharge Instructions    Discharge instructions    Complete by:  As directed   Follow up with PCP in 2 weeks.  Follow up with Dr Mayford Knife in 2 to 4 weeks.  Please obtain a sleep study or get a referral for sleep study as soon as possible.          Current Discharge Medication List    START taking these medications   Details  pantoprazole (PROTONIX) 40 MG tablet Take 1 tablet (40 mg total) by mouth daily at 6 (six) AM. Qty: 30 tablet, Refills: 0      CONTINUE these medications which have NOT CHANGED   Details  acetaminophen (TYLENOL) 500 MG tablet Take 1,000 mg by mouth every 6 (six) hours as needed for mild pain or headache.    ciprofloxacin-dexamethasone (CIPRODEX) otic suspension Place 4 drops into the right ear 2 (two) times daily.    EPINEPHrine (EPIPEN) 0.3 mg/0.3 mL IJ SOAJ injection Inject into the muscle once.    levothyroxine (SYNTHROID, LEVOTHROID) 150 MCG tablet Take 150 mcg by mouth daily before breakfast.    metoprolol succinate (TOPROL-XL) 25 MG 24 hr tablet Take 12.5 mg by mouth daily.       Allergies  Allergen Reactions  . Ivp Dye [Iodinated Diagnostic Agents] Anaphylaxis  . Rose Hives  . Shellfish Allergy Anaphylaxis  . Thimerosol [Thimerosal] Anaphylaxis  . Strawberry Swelling    Pt reports throat swelling and itching with any type of berry  .  Avelox [Moxifloxacin Hcl In Nacl]     Irregular heart beat  . Bacitracin Itching and Other (See Comments)    Blisters on skin   . Cinnamon Swelling  . Ginger Swelling  . Other Swelling    Nuts and ginger    . Sunscreens Swelling  . Cephalosporins Rash  . Codeine Rash  . Penicillins Rash   Follow-up Information    Follow up with NNODI, ADAKU, MD In 1 week.   Specialty:  Family Medicine   Contact information:   [address] [place] [address] [phone]       Follow up with Quintella ReichertURNER,TRACI R,  MD In 4 weeks.   Specialty:  Cardiology   Contact information:   [address] [place] [address] [phone]        The results of significant diagnostics from this hospitalization (including imaging, microbiology, ancillary and laboratory) are listed below for reference.    Significant Diagnostic Studies: Dg Chest 2 View  02/10/2014   CLINICAL DATA:  Chest pain and shortness of breath since earlier in the day  EXAM: CHEST  2 VIEW  COMPARISON:  June 12, 2012 there is stable scarring in the left base. Elsewhere lungs are clear. Heart size and pulmonary vascularity are normal. No adenopathy. No pneumothorax. No bone lesions.  FINDINGS: Stable scarring left base.  No edema or consolidation.  IMPRESSION: No active cardiopulmonary disease.   Electronically Signed   By: Bretta BangWilliam  Woodruff M.D.   On: 02/10/2014 11:16   Nm Myocar Multi W/spect W/wall Motion / Ef  02/11/2014   CLINICAL DATA:  History of chest pain, palpitations, hyperlipidemia, DVT.  EXAM: MYOCARDIAL IMAGING WITH SPECT (REST AND PHARMACOLOGIC-STRESS)  GATED LEFT VENTRICULAR WALL MOTION STUDY  LEFT VENTRICULAR EJECTION FRACTION  TECHNIQUE: Standard myocardial SPECT imaging was performed after resting intravenous injection of 10 mCi Tc-7740m sestamibi. Subsequently, intravenous infusion of Lexiscan was performed under the supervision of the Cardiology staff. At peak effect of the drug, 30 mCi Tc-3040m sestamibi was injected intravenously and standard myocardial SPECT imaging was performed. Quantitative gated imaging was also performed to evaluate left ventricular wall motion, and estimate left ventricular ejection fraction.  COMPARISON:  None.  FINDINGS: Raw images: Mild patient motion artifact is seen on the provided rest images. There is mild breast attenuation artifact seen on the provided rest and stress images.  Perfusion: There is a minimal amount of attenuation involving the left ventricular apex and inferior wall of left  ventricle which improves/resolves on the provided stress images. No scintigraphic evidence of prior infarction or pharmacologically induced ischemia.  Wall Motion: Normal left ventricular wall motion. No left ventricular dilation.  Left Ventricular Ejection Fraction: 74 %  End diastolic volume 64 ml  End systolic volume 16 ml  IMPRESSION: 1. No scintigraphic evidence of prior infarction or pharmacologically induced ischemia.  2. Normal left ventricular wall motion.  3. Left ventricular ejection fraction 74%  4. Low-risk stress test findings*.  *2012 Appropriate Use Criteria for Coronary Revascularization Focused Update: J Am Coll Cardiol. 2012;59(9):857-881. http://content.dementiazones.comonlinejacc.org/article.aspx?articleid=1201161   Electronically Signed   By: Simonne ComeJohn  Watts M.D.   On: 02/11/2014 13:08    Microbiology: No results found for this or any previous visit (from the past 240 hour(s)).   Labs: Basic Metabolic Panel:  Recent Labs Lab 02/10/14 1138 02/10/14 1604 02/11/14 0355  NA 143  --  140  K 4.4  --  3.9  CL 107  --  105  CO2 24  --  25  GLUCOSE 93  --  102*  BUN 12  --  12  CREATININE 0.80 0.75 0.77  CALCIUM 8.9  --  8.7   Liver Function Tests:  Recent Labs Lab 02/10/14 1604 02/11/14 0355  AST 18 15  ALT 21 19  ALKPHOS 61 59  BILITOT 0.4 0.3  PROT 6.6 6.4  ALBUMIN 3.5 3.3*   No results for input(s): LIPASE, AMYLASE in the last 168 hours. No results for input(s): AMMONIA in the last 168 hours. CBC:  Recent Labs Lab 02/10/14 1138 02/10/14 1604 02/11/14 0355  WBC 6.0 6.4 6.6  HGB 12.3 12.6 11.9*  HCT 36.2 36.2 34.9*  MCV 90.0 87.9 88.6  PLT 185 194 201   Cardiac Enzymes:  Recent Labs Lab 02/10/14 1604 02/10/14 2306 02/11/14 0355  TROPONINI <0.30 <0.30 <0.30   BNP: BNP (last 3 results) No results for input(s): PROBNP in the last 8760 hours. CBG: No results for input(s): GLUCAP in the last 168 hours.     SignedKathlen Mody:  Marck Mcclenny  [facility] 02/11/2014, 3:04 PM

## 2014-02-24 ENCOUNTER — Ambulatory Visit (INDEPENDENT_AMBULATORY_CARE_PROVIDER_SITE_OTHER): Payer: PRIVATE HEALTH INSURANCE | Admitting: Cardiology

## 2014-02-24 ENCOUNTER — Encounter: Payer: Self-pay | Admitting: Cardiology

## 2014-02-24 VITALS — BP 120/82 | HR 75 | Ht 66.0 in | Wt 218.0 lb

## 2014-02-24 DIAGNOSIS — E785 Hyperlipidemia, unspecified: Secondary | ICD-10-CM | POA: Insufficient documentation

## 2014-02-24 DIAGNOSIS — R0683 Snoring: Secondary | ICD-10-CM | POA: Insufficient documentation

## 2014-02-24 DIAGNOSIS — R079 Chest pain, unspecified: Secondary | ICD-10-CM

## 2014-02-24 DIAGNOSIS — I341 Nonrheumatic mitral (valve) prolapse: Secondary | ICD-10-CM | POA: Insufficient documentation

## 2014-02-24 DIAGNOSIS — R0602 Shortness of breath: Secondary | ICD-10-CM | POA: Insufficient documentation

## 2014-02-24 DIAGNOSIS — I10 Essential (primary) hypertension: Secondary | ICD-10-CM | POA: Insufficient documentation

## 2014-02-24 NOTE — Progress Notes (Signed)
[address] [place], [place]  [address] Phone: [phone] Fax:  [phone]  Date:  02/24/2014   ID:  Heather IgoRobin Lafoy, DOB 11-22-1950, MRN [id number]  PCP:  Gretel AcreNNODI, ADAKU, MD  Cardiologist:  Armanda Magicraci Lochlann Mastrangelo, MD    History of Present Illness: Heather Carey is a 63 y.o. female with a history of atypical chest pain with normal stress test 2009, HTN, fibromyalgia, dyslipidemia and palpitations with no arrhythmias on Holter who presents today for evaluation of chest pain.  She recently saw her PCP after hospitalization for chest pain.  This was sudden in onset and associated with SOB.  She was admitted and ruled out for MI.  Nuclear stress test was normal.  She was started on Protonix and discharged home.  There was some concern over possible sleep apnea and she is now here for further evaluation.  When she saw her PCP last week she had not started the Protonix and was still having episodes of SOB and tightness in her throat and chest that only occur at night and disrupt her sleep.  She has heartburn with eating tomato based products and has been under a lot of stress.    Wt Readings from Last 3 Encounters:  02/24/14 218 lb (98.884 kg)  02/10/14 216 lb 3.2 oz (98.068 kg)  02/16/11 215 lb (97.523 kg)     Past Medical History  Diagnosis Date  . Migraines   . TIA (transient ischemic attack)   . Thyroid disease   . Palpitations   . Fibromyalgia   . IBS (irritable bowel syndrome)   . GERD (gastroesophageal reflux disease)   . Palpitations   . Diverticulitis   . Otitis externa   . Osteoarthritis   . Hypertension   . Hyperlipidemia     Current Outpatient Prescriptions  Medication Sig Dispense Refill  . acetaminophen (TYLENOL) 500 MG tablet Take 1,000 mg by mouth every 6 (six) hours as needed for mild pain or headache.    Marland Kitchen. EPINEPHrine (EPIPEN) 0.3 mg/0.3 mL IJ SOAJ injection Inject into the muscle once.    Marland Kitchen. levothyroxine (SYNTHROID, LEVOTHROID) 150 MCG tablet Take 150 mcg by  mouth daily before breakfast.    . metoprolol succinate (TOPROL-XL) 25 MG 24 hr tablet Take 12.5 mg by mouth daily.    . pantoprazole (PROTONIX) 40 MG tablet Take 1 tablet (40 mg total) by mouth daily at 6 (six) AM. 30 tablet 0  . ciprofloxacin-dexamethasone (CIPRODEX) otic suspension Place 4 drops into the right ear 2 (two) times daily.     No current facility-administered medications for this visit.    Allergies:    Allergies  Allergen Reactions  . Ivp Dye [Iodinated Diagnostic Agents] Anaphylaxis  . Rose Hives  . Shellfish Allergy Anaphylaxis  . Thimerosol [Thimerosal] Anaphylaxis  . Strawberry Swelling    Pt reports throat swelling and itching with any type of berry  . Avelox [Moxifloxacin Hcl In Nacl]     Irregular heart beat  . Bacitracin Itching and Other (See Comments)    Blisters on skin   . Cinnamon Swelling  . Ginger Swelling  . Other Swelling    Nuts and ginger    . Sunscreens Swelling  . Cephalosporins Rash  . Codeine Rash  . Penicillins Rash    Social History:  The patient  reports that she has never smoked. She does not have any smokeless tobacco history on file. She reports that she drinks about 0.6 oz of alcohol per week. She  reports that she does not use illicit drugs.   Family History:  The patient's family history is not on file.   ROS:  Please see the history of present illness.      All other systems reviewed and negative.   PHYSICAL EXAM: VS:  BP 120/82 mmHg  Pulse 75  Ht 5\' 6"  (1.676 m)  Wt 218 lb (98.884 kg)  BMI 35.20 kg/m2 Well nourished, well developed, in no acute distress HEENT: normal Neck: no JVD Cardiac:  normal S1, S2; RRR; no murmur Lungs:  clear to auscultation bilaterally, no wheezing, rhonchi or rales Abd: soft, nontender, no hepatomegaly Ext: no edema Skin: warm and dry Neuro:  CNs 2-12 intact, no focal abnormalities noted  EKG:     NSR  ASSESSMENT AND PLAN:  1. Atypical chest pain that occurs only at night around 3-4am  and feels like a "house" is sitting on her chest and she cannot breathe.  Recent nuclear stress showed no ischemia. D-Dimer was negative.   She was supposed to start on Protonix but still has not started it.   2. H/O MVP but last echo did not show any MVP.  2D echo was not done on last admission so I will repeat that to rule out any valvular heart disease. 3. SOB that only occurs at night and is most likely secondary to GERD.   4. Loud snoring with periods of awakening SOB which could be due to OSA.   I will get a split night sleep study to rule out significant OSA.  Followup with me PRN    Signed, Armanda Magicraci Raeden Schippers, MD [facility] 02/24/2014 9:54 AM

## 2014-02-24 NOTE — Patient Instructions (Signed)
Your physician has requested that you have an echocardiogram. Echocardiography is a painless test that uses sound waves to create images of your heart. It provides your doctor with information about the size and shape of your heart and how well your heart's chambers and valves are working. This procedure takes approximately one hour. There are no restrictions for this procedure.  Your physician has recommended that you have a sleep study. This test records several body functions during sleep, including: brain activity, eye movement, oxygen and carbon dioxide blood levels, heart rate and rhythm, breathing rate and rhythm, the flow of air through your mouth and nose, snoring, body muscle movements, and chest and belly movement.  Your physician recommends that you schedule a follow-up appointment AS NEEDED with Dr. Mayford Knifeurner.

## 2014-02-28 ENCOUNTER — Encounter: Payer: Self-pay | Admitting: Cardiology

## 2014-02-28 ENCOUNTER — Ambulatory Visit (HOSPITAL_COMMUNITY): Payer: PRIVATE HEALTH INSURANCE | Attending: Cardiology

## 2014-02-28 DIAGNOSIS — I340 Nonrheumatic mitral (valve) insufficiency: Secondary | ICD-10-CM

## 2014-02-28 DIAGNOSIS — E785 Hyperlipidemia, unspecified: Secondary | ICD-10-CM | POA: Insufficient documentation

## 2014-02-28 DIAGNOSIS — I10 Essential (primary) hypertension: Secondary | ICD-10-CM | POA: Insufficient documentation

## 2014-02-28 DIAGNOSIS — I341 Nonrheumatic mitral (valve) prolapse: Secondary | ICD-10-CM | POA: Diagnosis not present

## 2014-02-28 DIAGNOSIS — R0602 Shortness of breath: Secondary | ICD-10-CM | POA: Diagnosis present

## 2014-02-28 HISTORY — DX: Nonrheumatic mitral (valve) insufficiency: I34.0

## 2014-02-28 NOTE — Progress Notes (Signed)
2D Echo completed. 02/28/2014 

## 2014-03-13 ENCOUNTER — Telehealth: Payer: Self-pay | Admitting: Cardiology

## 2014-03-13 DIAGNOSIS — R0683 Snoring: Secondary | ICD-10-CM

## 2014-03-13 NOTE — Telephone Encounter (Signed)
[facility] - please schedule

## 2014-03-14 NOTE — Telephone Encounter (Signed)
Informed patient Dr. Mayford Knifeurner recommends a sleep study and let her know someone will be calling her to schedule. Patient agrees with treatment plan.

## 2014-03-14 NOTE — Addendum Note (Signed)
Addended by: Gunnar FusiKEMP, Amar Sippel A on: 03/14/2014 09:30 AM   Modules accepted: Orders

## 2014-06-10 ENCOUNTER — Encounter (HOSPITAL_BASED_OUTPATIENT_CLINIC_OR_DEPARTMENT_OTHER): Payer: PRIVATE HEALTH INSURANCE

## 2014-06-11 ENCOUNTER — Emergency Department (HOSPITAL_COMMUNITY)
Admission: EM | Admit: 2014-06-11 | Discharge: 2014-06-11 | Disposition: A | Discharge status: Home / Self Care | Payer: PRIVATE HEALTH INSURANCE | Attending: Emergency Medicine | Admitting: Emergency Medicine

## 2014-06-11 ENCOUNTER — Emergency Department (HOSPITAL_COMMUNITY): Payer: PRIVATE HEALTH INSURANCE

## 2014-06-11 ENCOUNTER — Encounter (HOSPITAL_COMMUNITY): Payer: Self-pay | Admitting: *Deleted

## 2014-06-11 DIAGNOSIS — R55 Syncope and collapse: Secondary | ICD-10-CM | POA: Insufficient documentation

## 2014-06-11 DIAGNOSIS — R05 Cough: Secondary | ICD-10-CM | POA: Diagnosis not present

## 2014-06-11 DIAGNOSIS — M791 Myalgia: Secondary | ICD-10-CM | POA: Insufficient documentation

## 2014-06-11 DIAGNOSIS — Z8669 Personal history of other diseases of the nervous system and sense organs: Secondary | ICD-10-CM | POA: Diagnosis not present

## 2014-06-11 DIAGNOSIS — Z79899 Other long term (current) drug therapy: Secondary | ICD-10-CM | POA: Insufficient documentation

## 2014-06-11 DIAGNOSIS — M1990 Unspecified osteoarthritis, unspecified site: Secondary | ICD-10-CM | POA: Diagnosis not present

## 2014-06-11 DIAGNOSIS — R6883 Chills (without fever): Secondary | ICD-10-CM | POA: Insufficient documentation

## 2014-06-11 DIAGNOSIS — Z880 Allergy status to penicillin: Secondary | ICD-10-CM | POA: Diagnosis not present

## 2014-06-11 DIAGNOSIS — I10 Essential (primary) hypertension: Secondary | ICD-10-CM | POA: Insufficient documentation

## 2014-06-11 DIAGNOSIS — R6889 Other general symptoms and signs: Secondary | ICD-10-CM

## 2014-06-11 DIAGNOSIS — R0781 Pleurodynia: Secondary | ICD-10-CM | POA: Diagnosis not present

## 2014-06-11 DIAGNOSIS — K219 Gastro-esophageal reflux disease without esophagitis: Secondary | ICD-10-CM | POA: Insufficient documentation

## 2014-06-11 DIAGNOSIS — Z8673 Personal history of transient ischemic attack (TIA), and cerebral infarction without residual deficits: Secondary | ICD-10-CM | POA: Insufficient documentation

## 2014-06-11 DIAGNOSIS — Z792 Long term (current) use of antibiotics: Secondary | ICD-10-CM | POA: Insufficient documentation

## 2014-06-11 DIAGNOSIS — E079 Disorder of thyroid, unspecified: Secondary | ICD-10-CM | POA: Insufficient documentation

## 2014-06-11 LAB — I-STAT TROPONIN, ED: Troponin i, poc: 0 ng/mL (ref 0.00–0.08)

## 2014-06-11 LAB — BASIC METABOLIC PANEL
Anion gap: 10 (ref 5–15)
BUN: 14 mg/dL (ref 6–23)
CO2: 24 mmol/L (ref 19–32)
Calcium: 9.1 mg/dL (ref 8.4–10.5)
Chloride: 105 mmol/L (ref 96–112)
Creatinine, Ser: 0.92 mg/dL (ref 0.50–1.10)
GFR calc Af Amer: 76 mL/min — ABNORMAL LOW (ref >90)
GFR calc non Af Amer: 65 mL/min — ABNORMAL LOW (ref >90)
Glucose, Bld: 114 mg/dL — ABNORMAL HIGH (ref 70–99)
Potassium: 4.2 mmol/L (ref 3.5–5.1)
Sodium: 139 mmol/L (ref 135–145)

## 2014-06-11 LAB — CBC
HCT: 38.4 % (ref 36.0–46.0)
Hemoglobin: 13.2 g/dL (ref 12.0–15.0)
MCH: 31.1 pg (ref 26.0–34.0)
MCHC: 34.4 g/dL (ref 30.0–36.0)
MCV: 90.4 fL (ref 78.0–100.0)
Platelets: 225 10*3/uL (ref 150–400)
RBC: 4.25 MIL/uL (ref 3.87–5.11)
RDW: 13.2 % (ref 11.5–15.5)
WBC: 7.8 10*3/uL (ref 4.0–10.5)

## 2014-06-11 MED ORDER — SODIUM CHLORIDE 0.9 % IV BOLUS (SEPSIS)
1000.0000 mL | Freq: Once | INTRAVENOUS | Status: AC
  Administered 2014-06-11: 1000 mL via INTRAVENOUS

## 2014-06-11 MED ORDER — DEXAMETHASONE SODIUM PHOSPHATE 10 MG/ML IJ SOLN
10.0000 mg | Freq: Once | INTRAMUSCULAR | Status: DC
  Filled 2014-06-11: qty 1

## 2014-06-11 MED ORDER — ONDANSETRON HCL 4 MG/2ML IJ SOLN
4.0000 mg | Freq: Once | INTRAMUSCULAR | Status: AC
  Administered 2014-06-11: 4 mg via INTRAVENOUS
  Filled 2014-06-11: qty 2

## 2014-06-11 NOTE — Discharge Instructions (Signed)
Syncope Syncope is a medical term for fainting or passing out. This means you lose consciousness and drop to the ground. People are generally unconscious for less than 5 minutes. You may have some muscle twitches for up to 15 seconds before waking up and returning to normal. Syncope occurs more often in older adults, but it can happen to anyone. While most causes of syncope are not dangerous, syncope can be a sign of a serious medical problem. It is important to seek medical care.  CAUSES  Syncope is caused by a sudden drop in blood flow to the brain. The specific cause is often not determined. Factors that can bring on syncope include:  Taking medicines that lower blood pressure.  Sudden changes in posture, such as standing up quickly.  Taking more medicine than prescribed.  Standing in one place for too long.  Seizure disorders.  Dehydration and excessive exposure to heat.  Low blood sugar (hypoglycemia).  Straining to have a bowel movement.  Heart disease, irregular heartbeat, or other circulatory problems.  Fear, emotional distress, seeing blood, or severe pain. SYMPTOMS  Right before fainting, you may:  Feel dizzy or light-headed.  Feel nauseous.  See all white or all black in your field of vision.  Have cold, clammy skin. DIAGNOSIS  Your health care provider will ask about your symptoms, perform a physical exam, and perform an electrocardiogram (ECG) to record the electrical activity of your heart. Your health care provider may also perform other heart or blood tests to determine the cause of your syncope which may include:  Transthoracic echocardiogram (TTE). During echocardiography, sound waves are used to evaluate how blood flows through your heart.  Transesophageal echocardiogram (TEE).  Cardiac monitoring. This allows your health care provider to monitor your heart rate and rhythm in real time.  Holter monitor. This is a portable device that records your  heartbeat and can help diagnose heart arrhythmias. It allows your health care provider to track your heart activity for several days, if needed.  Stress tests by exercise or by giving medicine that makes the heart beat faster. TREATMENT  In most cases, no treatment is needed. Depending on the cause of your syncope, your health care provider may recommend changing or stopping some of your medicines. HOME CARE INSTRUCTIONS  Have someone stay with you until you feel stable.  Do not drive, use machinery, or play sports until your health care provider says it is okay.  Keep all follow-up appointments as directed by your health care provider.  Lie down right away if you start feeling like you might faint. Breathe deeply and steadily. Wait until all the symptoms have passed.  Drink enough fluids to keep your urine clear or pale yellow.  If you are taking blood pressure or heart medicine, get up slowly and take several minutes to sit and then stand. This can reduce dizziness. SEEK IMMEDIATE MEDICAL CARE IF:   You have a severe headache.  You have unusual pain in the chest, abdomen, or back.  You are bleeding from your mouth or rectum, or you have black or tarry stool.  You have an irregular or very fast heartbeat.  You have pain with breathing.  You have repeated fainting or seizure-like jerking during an episode.  You faint when sitting or lying down.  You have confusion.  You have trouble walking.  You have severe weakness.  You have vision problems. If you fainted, call your local emergency services (911 in [place] Do not drive  yourself to the hospital.  MAKE SURE YOU:  Understand these instructions.  Will watch your condition.  Will get help right away if you are not doing well or get worse. Document Released: 03/28/2005 Document Revised: 04/02/2013 Document Reviewed: 05/27/2011 [facility] Patient Information 2015 [place], [place]. This information is not intended to replace  advice given to you by your health care provider. Make sure you discuss any questions you have with your health care provider.  Driving and Equipment Restrictions Some medical problems make it dangerous to drive, ride a bike, or use machines. Some of these problems are:  A hard blow to the head (concussion).  Passing out (fainting).  Twitching and shaking (seizures).  Low blood sugar.  Taking medicine to help you relax (sedatives).  Taking pain medicines.  Wearing an eye patch.  Wearing splints. This can make it hard to use parts of your body that you need to drive safely. HOME CARE   Do not drive until your doctor says it is okay.  Do not use machines until your doctor says it is okay. You may need a form signed by your doctor (medical release) before you can drive again. You may also need this form before you do other tasks where you need to be fully alert. MAKE SURE YOU:  Understand these instructions.  Will watch your condition.  Will get help right away if you are not doing well or get worse. Document Released: 05/05/2004 Document Revised: 06/20/2011 Document Reviewed: 08/05/2009 [facility] Patient Information 2015 [place], [place]. This information is not intended to replace advice given to you by your health care provider. Make sure you discuss any questions you have with your health care provider.  Holter Monitoring A Holter monitor is a small device with electrodes (small sticky patches) that attach to your chest. It records the electrical activity of your heart and is worn continuously for 24-48 hours.  A HOLTER MONITOR IS USED TO  Detect heart problems such as:  Heart arrhythmia. Is an abnormal or irregular heartbeat. With some heart arrhythmias, you may not feel or know that you have an irregular heart rhythm.  Palpitations, such as feeling your heart racing or fluttering. It is possible to have heart palpitations and not have a heart arrhythmia.  A heart rhythm  that is too slow or too fast.  If you have problems fainting, near fainting or feeling light-headed, a Holter monitor may be worn to see if your heart is the cause. HOLTER MONITOR PREPARATION   Electrodes will be attached to the skin on your chest.  If you have hair on your chest, small areas may have to be shaved. This is done to help the patches stick better and make the recording more accurate.  The electrodes are attached by wires to the Holter monitor. The Holter monitor clips to your clothing. You will wear the monitor at all times, even while exercising and sleeping. HOME CARE INSTRUCTIONS   Wear your monitor at all times.  The wires and the monitor must stay dry. Do not get the monitor wet.  Do not bathe, swim or use a hot tub with it on.  You may do a "sponge" bath while you have the monitor on.  Keep your skin clean, do not put body lotion or moisturizer on your chest.  It's possible that your skin under the electrodes could become irritated. To keep this from happening, you may put the electrodes in slightly different places on your chest.  Your caregiver will  also ask you to keep a diary of your activities, such as walking or doing chores. Be sure to note what you are doing if you experience heart symptoms such as palpitations. This will help your caregiver determine what might be contributing to your symptoms. The information stored in your monitor will be reviewed by your caregiver alongside your diary entries.  Make sure the monitor is safely clipped to your clothing or in a location close to your body that your caregiver recommends.  The monitor and electrodes are removed when the test is over. Return the monitor as directed.  Be sure to follow up with your caregiver and discuss your Holter monitor results. SEEK IMMEDIATE MEDICAL CARE IF:  You faint or feel lightheaded.  You have trouble breathing.  You get pain in your chest, upper arm or jaw.  You feel sick to  your stomach and your skin is pale, cool, or damp.  You think something is wrong with the way your heart is beating. MAKE SURE YOU:   Understand these instructions.  Will watch your condition.  Will get help right away if you are not doing well or get worse. Document Released: 12/25/2003 Document Revised: 06/20/2011 Document Reviewed: 05/08/2008 [facility] Patient Information 2015 [place], [place]. This information is not intended to replace advice given to you by your health care provider. Make sure you discuss any questions you have with your health care provider. Anaphylactic Reaction An anaphylactic reaction is a sudden, severe allergic reaction that involves the whole body. It can be life threatening. A hospital stay is often required. People with asthma, eczema, or hay fever are slightly more likely to have an anaphylactic reaction. CAUSES  An anaphylactic reaction may be caused by anything to which you are allergic. After being exposed to the allergic substance, your immune system becomes sensitized to it. When you are exposed to that allergic substance again, an allergic reaction can occur. Common causes of an anaphylactic reaction include:  Medicines.  Foods, especially peanuts, wheat, shellfish, milk, and eggs.  Insect bites or stings.  Blood products.  Chemicals, such as dyes, latex, and contrast material used for imaging tests. SYMPTOMS  When an allergic reaction occurs, the body releases histamine and other substances. These substances cause symptoms such as tightening of the airway. Symptoms often develop within seconds or minutes of exposure. Symptoms may include:  Skin rash or hives.  Itching.  Chest tightness.  Swelling of the eyes, tongue, or lips.  Trouble breathing or swallowing.  Lightheadedness or fainting.  Anxiety or confusion.  Stomach pains, vomiting, or diarrhea.  Nasal congestion.  A fast or irregular heartbeat (palpitations). DIAGNOSIS    Diagnosis is based on your history of recent exposure to allergic substances, your symptoms, and a physical exam. Your caregiver may also perform blood or urine tests to confirm the diagnosis. TREATMENT  Epinephrine medicine is the main treatment for an anaphylactic reaction. Other medicines that may be used for treatment include antihistamines, steroids, and albuterol. In severe cases, fluids and medicine to support blood pressure may be given through an intravenous line (IV). Even if you improve after treatment, you need to be observed to make sure your condition does not get worse. This may require a stay in the hospital. [place] a medical alert bracelet or necklace stating your allergy.  You and your family must learn how to use an anaphylaxis kit or give an epinephrine injection to temporarily treat an emergency allergic reaction. Always carry  your epinephrine injection or anaphylaxis kit with you. This can be lifesaving if you have a severe reaction.  Do not drive or perform tasks after treatment until the medicines used to treat your reaction have worn off, or until your caregiver says it is okay.  If you have hives or a rash:  Take medicines as directed by your caregiver.  You may use an over-the-counter antihistamine (diphenhydramine) as needed.  Apply cold compresses to the skin or take baths in cool water. Avoid hot baths or showers. SEEK MEDICAL CARE IF:   You develop symptoms of an allergic reaction to a new substance. Symptoms may start right away or minutes later.  You develop a rash, hives, or itching.  You develop new symptoms. SEEK IMMEDIATE MEDICAL CARE IF:   You have swelling of the mouth, difficulty breathing, or wheezing.  You have a tight feeling in your chest or throat.  You develop hives, swelling, or itching all over your body.  You develop severe vomiting or diarrhea.  You feel faint or pass out. This is an emergency. Use your  epinephrine injection or anaphylaxis kit as you have been instructed. Call your local emergency services (911 in [place] Even if you improve after the injection, you need to be examined at a hospital emergency department. MAKE SURE YOU:   Understand these instructions.  Will watch your condition.  Will get help right away if you are not doing well or get worse. Document Released: 03/28/2005 Document Revised: 04/02/2013 Document Reviewed: 06/29/2011 [facility] Patient Information 2015 [place], [place]. This information is not intended to replace advice given to you by your health care provider. Make sure you discuss any questions you have with your health care provider.

## 2014-06-11 NOTE — ED Notes (Signed)
Pt in c/o cough, congestion, fever and body aches for the last two weeks, this morning patient had a syncopal episode, went to a doctors office and had a near syncopal episode, sent here for further evaluation

## 2014-06-11 NOTE — ED Notes (Signed)
[facility] after eating and had symptoms of rash and swelling tongue. Was given benadryl and solumedrol felt better however middle of the night took additional benadryl.  Woke up feeling better from cough and general weakness and allergic reaction however had a brief syncope episode couple of seconds per patient while sitting.

## 2014-06-11 NOTE — ED Provider Notes (Signed)
CSN: [id number]     Arrival date & time 06/11/14  1524 History   First MD Initiated Contact with Patient 06/11/14 1527     Chief Complaint  Patient presents with  . Loss of Consciousness  . Flu like symptoms      (Consider location/radiation/quality/duration/timing/severity/associated sxs/prior Treatment) HPI   Heather Carey Is a 64 year old female with a past medical history of migraines, history of TIA, IBS, GERD, mitral regurg, hypertension, hyperlipidemia. The patient presents to the emergency department with chief complaint of flu like symptoms and loss of consciousness. She has no previous history of LOC. The patient has had about 2 weeks of body aches, chills, cough, bilateral pleuritic chest pain. She is producing green and yellow phlegm. The patient completed a course of azithromycin. Patient states that yesterday she began feeling much better. She went out to eat with her husband. She had bread, and a baked potato. The patient began having lip and tongue swelling as well as swelling of the throat. She took 2 Benadryl and went to the [place] [facility] where she was given IV Solu-Medrol with great relief of her symptoms. The patient states that she awoke in the middle of the night with recurrence of throat swelling. She took 2 more Benadryl and had relief of her symptoms went to sleep. Patient states that she was feeling much better this morning. She was sitting in her recliner when she suddenly became , Hot, diaphoretic Nauseous, felt her heart race and Lost consciousness. She estimates that she was only unconscious for a few seconds. Called her husband who brought her to her PCP. She had an EKG done, felt as if she was going to pass out again and came by EMS to the ED. Denies fevers Denies DOE, SOB, chest tightness or pressure, radiation to left arm, jaw or back. Denies dysuria, flank pain, suprapubic pain, frequency, urgency, or hematuria.  Denies abdominal pain, nausea, vomiting, diarrhea  or constipation.    Past Medical History  Diagnosis Date  . Migraines   . TIA (transient ischemic attack)   . Thyroid disease   . Palpitations   . Fibromyalgia   . IBS (irritable bowel syndrome)   . GERD (gastroesophageal reflux disease)   . Palpitations   . Diverticulitis   . Otitis externa   . Osteoarthritis   . Hypertension   . Hyperlipidemia   . Mitral regurgitation 02/28/2014    Mild to moderate by echo 02/2014   Past Surgical History  Procedure Laterality Date  . Abdominal hysterectomy    . Carpel tunnel    . Lbp gso     History reviewed. No pertinent family history. History  Substance Use Topics  . Smoking status: Never Smoker   . Smokeless tobacco: Not on file  . Alcohol Use: 0.6 oz/week    1 Glasses of wine per week   OB History    No data available     Review of Systems  Ten systems reviewed and are negative for acute change, except as noted in the HPI.    Allergies  Ivp dye; Rose; Shellfish allergy; Thimerosol; Strawberry; Avelox; Bacitracin; Cinnamon; Ginger; Other; Sunscreens; Cephalosporins; Codeine; and Penicillins  Home Medications   Prior to Admission medications   Medication Sig Start Date End Date Taking? Authorizing Provider  acetaminophen (TYLENOL) 500 MG tablet Take 1,000 mg by mouth every 6 (six) hours as needed for mild pain or headache.    Historical Provider, MD  ciprofloxacin-dexamethasone (CIPRODEX) otic suspension Place  4 drops into the right ear 2 (two) times daily.    Historical Provider, MD  EPINEPHrine (EPIPEN) 0.3 mg/0.3 mL IJ SOAJ injection Inject into the muscle once.    Historical Provider, MD  levothyroxine (SYNTHROID, LEVOTHROID) 150 MCG tablet Take 150 mcg by mouth daily before breakfast.    Historical Provider, MD  metoprolol succinate (TOPROL-XL) 25 MG 24 hr tablet Take 12.5 mg by mouth daily.    Historical Provider, MD  pantoprazole (PROTONIX) 40 MG tablet Take 1 tablet (40 mg total) by mouth daily at 6 (six) AM.  02/11/14   Kathlen ModyVijaya Akula, MD   BP 123/72 mmHg  Pulse 70  Temp(Src) 98.6 F (37 C) (Oral)  Resp 16  SpO2 100% Physical Exam  Constitutional: She is oriented to person, place, and time. She appears well-developed and well-nourished. No distress.  HENT:  Head: Normocephalic and atraumatic.  Eyes: Conjunctivae are normal. No scleral icterus.  Neck: Normal range of motion.  Cardiovascular: Normal rate, regular rhythm and normal heart sounds.  Exam reveals no gallop and no friction rub.   No murmur heard. Pulmonary/Chest: Effort normal and breath sounds normal. No respiratory distress. She has no wheezes. She exhibits no tenderness.  Abdominal: Soft. Bowel sounds are normal. She exhibits no distension and no mass. There is no tenderness. There is no guarding.  Neurological: She is alert and oriented to person, place, and time.  Skin: Skin is warm and dry. She is not diaphoretic.    ED Course  Procedures (including critical care time) Labs Review Labs Reviewed  BASIC METABOLIC PANEL - Abnormal; Notable for the following:    Glucose, Bld 114 (*)    GFR calc non Af Amer 65 (*)    GFR calc Af Amer 76 (*)    All other components within normal limits  CBC  I-STAT TROPOININ, ED    Imaging Review Dg Chest 2 View  06/11/2014   CLINICAL DATA:  Recent syncopal episodes  EXAM: CHEST  2 VIEW  COMPARISON:  02/10/2014  FINDINGS: The heart size and mediastinal contours are within normal limits. Both lungs are well aerated. Persistent scarring is noted in the left lung base. The visualized skeletal structures are unremarkable.  IMPRESSION: Minimal scarring in the left base.  No acute abnormality is noted.   Electronically Signed   By: Alcide CleverMark  Lukens M.D.   On: 06/11/2014 16:26     EKG Interpretation None      ECG interpretation   Date: 06/12/2014  Rate: 72  Rhythm: normal sinus rhythm  QRS Axis: normal  Intervals: normal  ST/T Wave abnormalities: normal  Conduction Disutrbances: none   Narrative Interpretation:   Old EKG Reviewed: No significant changes noted     MDM   Final diagnoses:  Syncope and collapse  Flu-like symptoms     BP 123/72 mmHg  Pulse 70  Temp(Src) 98.6 F (37 C) (Oral)  Resp 16  SpO2 100%  Patient with syncopal episode after apparent allergic rxn.  Her work up is  Negative except for mildly elevated glucose likely secondary to steroid. Cxr/EKG unremarkable. I have discussed the patient with D.r Rubin PayorPickering who feels this is likely part of allergic/3rd spacing of fluids in the abdomen. Her airway is patent and no signs of edema. Her recent flu like sxs may have also contributed.She is orthostatic negative. I have offered the patient obs admission and she declines. The patient has an established relationship with dr. Carolanne Grumblingracy Turner and we will have her follow up.  She is to begin taking her oral prednisone at home tonight.  I doubt other causes such as PE, cardiac arrhythmia. Discussed with dr. Rubin Payor who agrees with the POC .  The patient appears reasonably screened and/or stabilized for discharge and I doubt any other medical condition or other [facility] requiring further screening, evaluation, or treatment in the ED at this time prior to discharge.  I personally reviewed the imaging tests through PACS system. I have reviewed and interpreted Lab values. I reviewed available ER/hospitalization records through the EMR         Arthor Captain, PA-C 06/12/14 1129  Harrold Donath R. Rubin Payor, MD 06/12/14 [phone]

## 2014-08-12 ENCOUNTER — Encounter (HOSPITAL_BASED_OUTPATIENT_CLINIC_OR_DEPARTMENT_OTHER): Payer: PRIVATE HEALTH INSURANCE

## 2014-09-22 ENCOUNTER — Encounter (HOSPITAL_COMMUNITY): Payer: Self-pay | Admitting: Emergency Medicine

## 2014-09-22 ENCOUNTER — Emergency Department (HOSPITAL_COMMUNITY)
Admission: EM | Admit: 2014-09-22 | Discharge: 2014-09-22 | Disposition: A | Discharge status: Home / Self Care | Payer: PRIVATE HEALTH INSURANCE | Attending: Emergency Medicine | Admitting: Emergency Medicine

## 2014-09-22 DIAGNOSIS — Z79899 Other long term (current) drug therapy: Secondary | ICD-10-CM | POA: Insufficient documentation

## 2014-09-22 DIAGNOSIS — M1990 Unspecified osteoarthritis, unspecified site: Secondary | ICD-10-CM | POA: Insufficient documentation

## 2014-09-22 DIAGNOSIS — Z880 Allergy status to penicillin: Secondary | ICD-10-CM | POA: Diagnosis not present

## 2014-09-22 DIAGNOSIS — K219 Gastro-esophageal reflux disease without esophagitis: Secondary | ICD-10-CM | POA: Diagnosis not present

## 2014-09-22 DIAGNOSIS — I10 Essential (primary) hypertension: Secondary | ICD-10-CM | POA: Diagnosis not present

## 2014-09-22 DIAGNOSIS — Z203 Contact with and (suspected) exposure to rabies: Secondary | ICD-10-CM

## 2014-09-22 DIAGNOSIS — Z86718 Personal history of other venous thrombosis and embolism: Secondary | ICD-10-CM | POA: Insufficient documentation

## 2014-09-22 DIAGNOSIS — G43909 Migraine, unspecified, not intractable, without status migrainosus: Secondary | ICD-10-CM | POA: Insufficient documentation

## 2014-09-22 DIAGNOSIS — E079 Disorder of thyroid, unspecified: Secondary | ICD-10-CM | POA: Diagnosis not present

## 2014-09-22 DIAGNOSIS — Z23 Encounter for immunization: Secondary | ICD-10-CM | POA: Diagnosis not present

## 2014-09-22 DIAGNOSIS — M436 Torticollis: Secondary | ICD-10-CM | POA: Diagnosis not present

## 2014-09-22 MED ORDER — TETANUS-DIPHTH-ACELL PERTUSSIS 5-2.5-18.5 LF-MCG/0.5 IM SUSP
0.5000 mL | Freq: Once | INTRAMUSCULAR | Status: AC
  Administered 2014-09-22: 0.5 mL via INTRAMUSCULAR
  Filled 2014-09-22: qty 0.5

## 2014-09-22 MED ORDER — RABIES VACCINE, PCEC IM SUSR
1.0000 mL | Freq: Once | INTRAMUSCULAR | Status: AC
  Administered 2014-09-22: 1 mL via INTRAMUSCULAR
  Filled 2014-09-22: qty 1

## 2014-09-22 MED ORDER — RABIES IMMUNE GLOBULIN 150 UNIT/ML IM INJ
20.0000 [IU]/kg | INJECTION | Freq: Once | INTRAMUSCULAR | Status: AC
  Administered 2014-09-22: 1950 [IU] via INTRAMUSCULAR
  Filled 2014-09-22: qty 13

## 2014-09-22 NOTE — ED Notes (Signed)
Pt got bite on neck about 8 - 10 days ago, pt also got stung Friday by an insect. Pt does not know what she got bite bypt states all weekend has had headache, numbness to left arm and tremor.

## 2014-09-22 NOTE — ED Provider Notes (Signed)
CSN: [id number]     Arrival date & time 09/22/14  1639 History  This chart was scribed for non-physician practitioner, Oswaldo Conroy, PA-C working with Lorre Nick, MD by Placido Sou, ED scribe. This patient was seen in room WTR9/WTR9 and the patient's care was started at 5:16 PM.    Chief Complaint  Patient presents with  . Insect Bite    The history is provided by the patient. No language interpreter was used.    HPI Comments: Heather Carey is a 64 y.o. female who presents to the Emergency Department complaining of an insect bite that occurred on her upper central chest 10 days ago. Pt was outdoors when she was struck in the chest and bit and is concerned that this may have been a bat and additionally notes that multiple neighbors have had bat infestations in their homes. Pt additionally notes being stung by a wasp on her posterior upper right arm 3 days ago. Following the second bite, the pt notes HA, mild neck pain and stiffness, sore throat, numbness in upper lip and across face, muscle contraction in left upper arm, and tremors in left hand.  All of these symptoms have resolved. Pt denies seeing any recent ticks or tick marks on her skin. She notes currently taking Toprol for palpitations. She additionally notes a history of severe reactions to z-paks Pt denies fever, chills, SOB, nausea, vomiting, and generalized pain.   Past Medical History  Diagnosis Date  . Migraines   . TIA (transient ischemic attack)   . Thyroid disease   . Palpitations   . Fibromyalgia   . IBS (irritable bowel syndrome)   . GERD (gastroesophageal reflux disease)   . Palpitations   . Diverticulitis   . Otitis externa   . Osteoarthritis   . Hypertension   . Hyperlipidemia   . Mitral regurgitation 02/28/2014    Mild to moderate by echo 02/2014   Past Surgical History  Procedure Laterality Date  . Abdominal hysterectomy    . Carpel tunnel    . Lbp gso     History reviewed. No pertinent family  history. History  Substance Use Topics  . Smoking status: Never Smoker   . Smokeless tobacco: Not on file  . Alcohol Use: 0.6 oz/week    1 Glasses of wine per week   OB History    No data available     Review of Systems  Constitutional: Negative for fever and chills.  Respiratory: Negative for cough, chest tightness and shortness of breath.   Gastrointestinal: Negative for nausea and vomiting.  Musculoskeletal: Positive for neck pain and neck stiffness. Negative for myalgias.  Neurological: Positive for numbness and headaches.      Allergies  Ivp dye; Rose; Shellfish allergy; Thimerosol; Strawberry; Avelox; Bacitracin; Cinnamon; Ginger; Other; Sunscreens; Cephalosporins; Codeine; and Penicillins  Home Medications   Prior to Admission medications   Medication Sig Start Date End Date Taking? Authorizing Provider  acetaminophen (TYLENOL) 500 MG tablet Take 1,000 mg by mouth every 6 (six) hours as needed for mild pain or headache.    Historical Provider, MD  ciprofloxacin-dexamethasone (CIPRODEX) otic suspension Place 4 drops into the right ear 2 (two) times daily.    Historical Provider, MD  EPINEPHrine (EPIPEN) 0.3 mg/0.3 mL IJ SOAJ injection Inject into the muscle once.    Historical Provider, MD  levothyroxine (SYNTHROID, LEVOTHROID) 150 MCG tablet Take 150 mcg by mouth daily before breakfast.    Historical Provider, MD  metoprolol succinate (TOPROL-XL) 25  MG 24 hr tablet Take 12.5 mg by mouth daily.    Historical Provider, MD  pantoprazole (PROTONIX) 40 MG tablet Take 1 tablet (40 mg total) by mouth daily at 6 (six) AM. 02/11/14   Kathlen Mody, MD   BP 133/77 mmHg  Pulse 82  Temp(Src) 98 F (36.7 C) (Oral)  Resp 18  Wt 214 lb (97.07 kg)  SpO2 98% Physical Exam  Constitutional: She is oriented to person, place, and time. She appears well-developed and well-nourished. No distress.  HENT:  Head: Normocephalic and atraumatic.  Eyes: Conjunctivae and EOM are normal. Pupils  are equal, round, and reactive to light. Right eye exhibits no discharge. Left eye exhibits no discharge.  Cardiovascular: Normal rate and regular rhythm.   Pulmonary/Chest: Effort normal and breath sounds normal. No respiratory distress. She has no wheezes.  Abdominal: Soft. Bowel sounds are normal. She exhibits no distension. There is no tenderness.  Neurological: She is alert and oriented to person, place, and time. No cranial nerve deficit. She exhibits normal muscle tone. Coordination normal.  Speech is clear and goal oriented. Strength 5/5 in upper and lower extremities. Sensation intact. Intact rapid alternating movements, finger to nose, and heel to shin. No pronator drift. Normal gait.  Skin: Skin is warm and dry. She is not diaphoretic.  Small erythematous bit mark to left chest without evidence of infection. No pus. No surrounding erythema, edema. No evidence of insect bite to right arm.  Nursing note and vitals reviewed.   ED Course  Procedures  DIAGNOSTIC STUDIES: Oxygen Saturation is 93% on RA, adequate by my interpretation.    COORDINATION OF CARE: 5:32 PM Discussed treatment plan with pt at bedside including a rabies vaccination and pt agreed to plan.  Labs Review Labs Reviewed - No data to display  Imaging Review No results found.   EKG Interpretation None      Meds given in ED:  Medications  Tdap (BOOSTRIX) injection 0.5 mL (0.5 mLs Intramuscular Given 09/22/14 1934)  rabies immune globulin (HYPERAB) injection 1,950 Units (1,950 Units Intramuscular Given 09/22/14 1935)  rabies vaccine (RABAVERT) injection 1 mL (1 mL Intramuscular Given 09/22/14 1936)    Discharge Medication List as of 09/22/2014  7:47 PM        MDM   Final diagnoses:  Need for post exposure prophylaxis for rabies   Pt with possible exposure to a bat. Pt given rabies vaccine and immune globulin as above and instructed to return here/PCP/urgent care day 3, 14, 7 which was written in  discharge summary. Patient is afebrile, nontoxic, and in no acute distress. [pt currently asymptomatic and normal neurological exam. Patient is appropriate for outpatient management and is stable for discharge.  Discussed return precautions with patient. Patient verbalizes understanding and agrees with plan.  I personally performed the services described in this documentation, which was scribed in my presence. The recorded information has been reviewed and is accurate.   Oswaldo Conroy, PA-C 09/26/14 1559  Lorre Nick, MD 10/02/14 [phone]

## 2014-09-22 NOTE — Discharge Instructions (Signed)
Return to the emergency room with worsening of symptoms, new symptoms or with symptoms that are concerning, especially fevers, headache, numbness, tingling, feel faint, nausea, vomiting.      [facility]     [address]                                             [place], [place] [address]              [phone]                                  INSTRUCTIONS FOR THE PATIENT  Patient's Name: Heather Carey                     Original Order Date:  09/22/2014  Medical Record Number: [id number]  ED Physician: . Primary Diagnosis: Rabies Exposure       PCP: Gretel Acre, MD . 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 Species of Animal: bat IMMUNOGLOBULIN INJECTION GIVEN IN THE ER?: yes   You have been seen in the Emergency Department for a possible rabies exposure.  You must return for the additional vaccine doses to the Urgent Care Center ([address]) next to [facility].  If needed, your immunoglobulin follow-up injections, need to be scheduled for the dates below. If your first visit should fall on a weekend day, please come anytime between the hours of 9am-7pm.  DAY 0:  Date 09/22/14    To: Urgent Care  DAY 3:  Date 09/25/14     To:  Urgent Care  DAY 7:  Date 09/29/14     To:  Urgent Care  DAY 14:  Date 10/06/14   To:  Urgent Care  The 5th vaccine injection is considered for immune compromised patients only.  DAY 28:  Date    To:  Urgent Care  The Urgent Care Center is open from 8am-8pm Monday thru Friday and 9am-7pm on Saturdays and Sundays.  There will be a minimal fee for the injection that will be billed to your insurance company along with the charge for the vaccine.                                                                                                Date: 09/22/2014  Patient Signature:  _________________________________________________  [facility] Copy   Patient Copy      Pharmacy Copy   Rabies  Rabies is a viral infection that can be spread to people from infected animals. The infection affects the brain and central nervous system. Once the disease develops, it almost always causes death. Because of this, when a person is bitten by an animal that may have rabies, treatment to prevent rabies often needs  to be started whether or not the animal is known to be infected. Prompt treatment with the rabies vaccine and rabies immune globulin is very effective at preventing the infection from developing in people who have been exposed to the rabies virus. CAUSES  Rabies is caused by a virus that lives inside some animals. When a person is bitten by an infected animal, the rabies virus is spread to the person through the infected spit (saliva) of the animal. This virus can be carried by animals such as dogs, cats, skunks, bats, woodchucks, raccoons, coyotes, and foxes. SYMPTOMS  By the time symptoms appear, rabies is usually fatal for the person. Common symptoms include:  Headache.  Fever.  Fatigue and weakness.  Agitation.  Anxiety.  Confusion.  Unusual behavior, such as hyperactivity, fear of water (hydrophobia), or fear of air (aerophobia).  Hallucinations.  Insomnia.  Weakness in the arms or legs.  Difficulty swallowing. Most people get sick in 1-3 months after being bitten. This often varies and may depend on the location of the bite. The infection will take less time to develop if the bite occurred closer to the head.  DIAGNOSIS  To determine if a person is infected, several tests must be performed, such as:  A skin biopsy.  A saliva test.  A lumbar puncture to remove spinal fluid so it can be examined.  Blood tests. TREATMENT  Treatment to prevent the infection from developing (post-exposure prophylaxis, PEP) is often started before knowing for sure if the person  has been exposed to the rabies virus. PEP involves cleaning the wound, giving an antibody injection (rabies immune globulin), and giving a series of rabies vaccine injections. The series of injections are usually given over a two-week period. If possible, the animal that bit the person will be observed to see if it remains healthy. If the animal has been killed, it can be sent to a state laboratory and examined to see if the animal had rabies. If a person is bitten by a domestic animal (dog, cat, or ferret) that appears healthy and can be observed to see if it remains healthy, often no further treatment is necessary other than care of the wounds caused by the animal. Rabies is often a fatal illness once the infection develops in a person. Although a few people who developed rabies have survived after experimental treatment with certain drugs, all these survivors still had severe nervous system problems after the treatment. This is why caregivers use extra caution and begin PEP treatment for people who have been bitten by animals that are possibly infected with rabies.  HOME CARE INSTRUCTIONS  If you were bitten by an unknown animal, make sure you know your caregiver's instructions for follow-up. If the animal was sent to a laboratory for examination, ask when the test results will be ready. Make sure you get the test results.  Take these steps to care for your wound:  Keep the wound clean, dry, and dressed as directed by your caregiver.  Keep the injured part elevated as much as possible.  Do not resume use of the affected area until directed.  Only take over-the-counter or prescription medicines as directed by your caregiver.  Keep all follow-up appointments as directed by your caregiver. PREVENTION  To prevent rabies, people need to reduce their risk of having contact with infected animals.   Make sure your pets (dogs, cats, ferrets) are vaccinated against rabies. Keep these vaccinations  up-to-date as directed by your veterinarian.  Supervise your  pets when they are outside. Keep them away from wild animals.  Call your local animal control services to report any stray animals. These animals may not be vaccinated.  Stay away from stray or wild animals.  Consider getting the rabies vaccine (preexposure) if you are traveling to an area where rabies is common or if your job or activities involve possible contact with wild or stray animals. Discuss this with your caregiver. Document Released: 03/28/2005 Document Revised: 12/21/2011 Document Reviewed: 10/25/2011 [facility] Patient Information 2015 [place], [place]. This information is not intended to replace advice given to you by your health care provider. Make sure you discuss any questions you have with your health care provider.   Rabies Vaccine What You Need to Know WHAT IS RABIES?  Rabies is a serious disease. It is caused by a virus.  Rabies is mainly a disease of animals. Humans get rabies when they are bitten by infected animals.  At first there might not be any symptoms. But weeks, or even years after a bite, rabies can cause pain, fatigue, headaches, fever, and irritability. These are followed by seizures, hallucinations, and paralysis. Human rabies is almost always fatal.  Wild animals, especially bats, are the most common source of human rabies infection in the [place]. Skunks, raccoons, dogs, cats, coyotes, foxes, and other mammals can also transmit the disease.  Human rabies is rare in the [place]. There have been only 55 cases diagnosed since 1990. However, between 16,000 and 39,000 people are vaccinated each year as a precaution after animal bites. Also, rabies is far more common in other parts of the world, with about 40,000 to 70,000 rabies-related deaths worldwide each year. Bites from unvaccinated dogs cause most of these cases. Rabies vaccine can prevent rabies. RABIES VACCINE  Rabies vaccine is  given to people at high risk of rabies to protect them if they are exposed. It can also prevent the disease if it is given to a person after they have been exposed.  Rabies vaccine is made from killed rabies virus. It cannot cause rabies. WHO SHOULD GET RABIES VACCINE AND WHEN? Preventive Vaccination (No Exposure)  People at high risk of exposure to rabies, such as veterinarians, Educational psychologist, rabies laboratory workers, spelunkers, and rabies biologics production workers should be offered rabies vaccine.  The vaccine should also be considered for:  People whose activities bring them into frequent contact with rabies virus or with possibly rabid animals.  International travelers who are likely to come in contact with animals in parts of the world where rabies is common.  The pre-exposure schedule for rabies vaccination is 3 doses, given at the following times:  Dose 1: As appropriate.  Dose 2: 7 days after Dose 1.  Dose 3: 21 days or 28 days after Dose 1.  For laboratory workers and others who may be repeatedly exposed to rabies virus, periodic testing for immunity is recommended and booster doses should be given as needed. (Testing or booster doses are not recommended for travelers). Ask your doctor for details. Vaccination After an Exposure Anyone who has been bitten by an animal, or who otherwise may have been exposed to rabies, should clean the wound and see a doctor immediately. The doctor will determine if they need to be vaccinated. A person who is exposed and has never been vaccinated against rabies should get 4 doses of rabies vaccine: one dose right away and additional doses on the 3rd, 7th, and 14th days. They should also get another shot  called Rabies Immune Globulin at the same time as the first dose.  A person who has been previously vaccinated should get 2 doses of rabies vaccine: one right away and another on the 3rd day. Rabies Immune Globulin is not needed. TELL YOUR  DOCTOR IF: Talk with a doctor before getting rabies vaccine if you:  Ever had a serious (life-threatening) allergic reaction to a previous dose of rabies vaccine or to any component of the vaccine; tell your doctor if you have any severe allergies.  Have a weakened immune system because of:  HIV, AIDS, or another disease that affects the immune system.  Treatment with drugs that affect the immune system, such as steroids.  Cancer or cancer treatment with radiation or drugs. If you have a minor illness, such as a cold, you can be vaccinated. If you are moderately or severely ill, you should probably wait until you recover before getting a routine (non-exposure) dose of rabies vaccine. If you have been exposed to rabies virus, you should get the vaccine regardless of any other illnesses you may have. WHAT ARE THE RISKS FROM RABIES VACCINE? A vaccine, like any medicine, is capable of causing serious problems, such as severe allergic reactions. The risk of a vaccine causing serious harm, or death, is extremely small. Serious problems from rabies vaccine are very rare.  Mild problems:  Soreness, redness, swelling, or itching where the shot was given (30% to 74%).  Headache, nausea, abdominal pain, muscle aches, or dizziness (5% to 40%). Moderate problems:  Hives, pain in the joints, or fever (about 6% of booster doses).  Other nervous system disorders, such as Guillain-Barr Syndrome (GBS), have been reported after rabies vaccine, but this happens so rarely that it is not known whether they are related to the vaccine. Note: Several brands of rabies vaccine are available in the [place], and reactions may vary between brands. Your provider can give you more information about a particular brand. WHAT IF THERE IS A SERIOUS REACTION? What should I look for? Look for anything that concerns you, such as signs of a severe allergic reaction, very high fever, or behavior changes.  Signs of a  severe allergic reaction can include hives, swelling of the face and throat, difficulty breathing, a fast heartbeat, dizziness, and weakness. These would start a few minutes to a few hours after the vaccination. What should I do?  If you think it is a severe allergic reaction or other emergency that cannot wait, call 911 or get the person to the nearest hospital. Otherwise, call your doctor.  Afterward, the reaction should be reported to the Vaccine Adverse Event Reporting System (VAERS). Your doctor might file this report, or you can do it yourself through the VAERS website at www.vaers.LAgents.no or by calling 1-671 729 9458. VAERS is only for reporting reactions. They do not give medical advice. HOW CAN I LEARN MORE?  Ask your doctor.  Call your local or state health department.  Contact the Centers for Disease Control and Prevention (CDC):  Visit the CDC rabies website at wwwcrv.com CDC Rabies Vaccine VIS (01/15/08) Document Released: 01/23/2006 Document Revised: 03/14/2012 Document Reviewed: 07/18/2012 [facility] Patient Information 2015 [place], [place]. This information is not intended to replace advice given to you by your health care provider. Make sure you discuss any questions you have with your health care provider.

## 2014-09-29 ENCOUNTER — Encounter (HOSPITAL_COMMUNITY): Payer: Self-pay | Admitting: Emergency Medicine

## 2014-09-29 ENCOUNTER — Emergency Department (HOSPITAL_COMMUNITY)
Admission: EM | Admit: 2014-09-29 | Discharge: 2014-09-29 | Disposition: A | Discharge status: Home / Self Care | Payer: PRIVATE HEALTH INSURANCE | Source: Home / Self Care

## 2014-09-29 DIAGNOSIS — Z203 Contact with and (suspected) exposure to rabies: Secondary | ICD-10-CM | POA: Diagnosis not present

## 2014-09-29 MED ORDER — RABIES VACCINE, PCEC IM SUSR
1.0000 mL | Freq: Once | INTRAMUSCULAR | Status: AC
  Administered 2014-09-29: 1 mL via INTRAMUSCULAR

## 2014-09-29 MED ORDER — RABIES VACCINE, PCEC IM SUSR
INTRAMUSCULAR | Status: AC
  Filled 2014-09-29: qty 1

## 2014-09-29 NOTE — ED Notes (Signed)
Here for rabies day 7

## 2014-10-08 ENCOUNTER — Emergency Department (INDEPENDENT_AMBULATORY_CARE_PROVIDER_SITE_OTHER)
Admission: EM | Admit: 2014-10-08 | Discharge: 2014-10-08 | Disposition: A | Discharge status: Home / Self Care | Payer: PRIVATE HEALTH INSURANCE | Source: Home / Self Care

## 2014-10-08 ENCOUNTER — Encounter (HOSPITAL_COMMUNITY): Payer: Self-pay | Admitting: Emergency Medicine

## 2014-10-08 DIAGNOSIS — Z203 Contact with and (suspected) exposure to rabies: Secondary | ICD-10-CM | POA: Diagnosis not present

## 2014-10-08 MED ORDER — RABIES VACCINE, PCEC IM SUSR
INTRAMUSCULAR | Status: AC
  Filled 2014-10-08: qty 1

## 2014-10-08 MED ORDER — RABIES VACCINE, PCEC IM SUSR
1.0000 mL | Freq: Once | INTRAMUSCULAR | Status: AC
  Administered 2014-10-08: 1 mL via INTRAMUSCULAR

## 2014-10-08 NOTE — ED Notes (Signed)
Here for day 14

## 2014-12-07 ENCOUNTER — Emergency Department (HOSPITAL_COMMUNITY)
Admission: EM | Admit: 2014-12-07 | Discharge: 2014-12-07 | Disposition: A | Discharge status: Home / Self Care | Payer: PRIVATE HEALTH INSURANCE | Attending: Emergency Medicine | Admitting: Emergency Medicine

## 2014-12-07 ENCOUNTER — Encounter (HOSPITAL_COMMUNITY): Payer: Self-pay

## 2014-12-07 DIAGNOSIS — K219 Gastro-esophageal reflux disease without esophagitis: Secondary | ICD-10-CM | POA: Diagnosis not present

## 2014-12-07 DIAGNOSIS — I10 Essential (primary) hypertension: Secondary | ICD-10-CM | POA: Insufficient documentation

## 2014-12-07 DIAGNOSIS — T781XXA Other adverse food reactions, not elsewhere classified, initial encounter: Secondary | ICD-10-CM | POA: Diagnosis not present

## 2014-12-07 DIAGNOSIS — Y9289 Other specified places as the place of occurrence of the external cause: Secondary | ICD-10-CM | POA: Insufficient documentation

## 2014-12-07 DIAGNOSIS — Z8669 Personal history of other diseases of the nervous system and sense organs: Secondary | ICD-10-CM | POA: Diagnosis not present

## 2014-12-07 DIAGNOSIS — M1990 Unspecified osteoarthritis, unspecified site: Secondary | ICD-10-CM | POA: Diagnosis not present

## 2014-12-07 DIAGNOSIS — Y998 Other external cause status: Secondary | ICD-10-CM | POA: Insufficient documentation

## 2014-12-07 DIAGNOSIS — X58XXXA Exposure to other specified factors, initial encounter: Secondary | ICD-10-CM | POA: Diagnosis not present

## 2014-12-07 DIAGNOSIS — Z880 Allergy status to penicillin: Secondary | ICD-10-CM | POA: Diagnosis not present

## 2014-12-07 DIAGNOSIS — Y9389 Activity, other specified: Secondary | ICD-10-CM | POA: Insufficient documentation

## 2014-12-07 DIAGNOSIS — R0602 Shortness of breath: Secondary | ICD-10-CM | POA: Diagnosis present

## 2014-12-07 DIAGNOSIS — Z8673 Personal history of transient ischemic attack (TIA), and cerebral infarction without residual deficits: Secondary | ICD-10-CM | POA: Insufficient documentation

## 2014-12-07 DIAGNOSIS — E079 Disorder of thyroid, unspecified: Secondary | ICD-10-CM | POA: Diagnosis not present

## 2014-12-07 DIAGNOSIS — I340 Nonrheumatic mitral (valve) insufficiency: Secondary | ICD-10-CM | POA: Diagnosis not present

## 2014-12-07 LAB — CBC
HCT: 49.6 % — ABNORMAL HIGH (ref 36.0–46.0)
Hemoglobin: 17.4 g/dL — ABNORMAL HIGH (ref 12.0–15.0)
MCH: 31.2 pg (ref 26.0–34.0)
MCHC: 35.1 g/dL (ref 30.0–36.0)
MCV: 89 fL (ref 78.0–100.0)
Platelets: 133 10*3/uL — ABNORMAL LOW (ref 150–400)
RBC: 5.57 MIL/uL — ABNORMAL HIGH (ref 3.87–5.11)
RDW: 13 % (ref 11.5–15.5)
WBC: 4.7 10*3/uL (ref 4.0–10.5)

## 2014-12-07 LAB — BASIC METABOLIC PANEL
Anion gap: 6 (ref 5–15)
BUN: 13 mg/dL (ref 6–20)
CO2: 23 mmol/L (ref 22–32)
Calcium: 8.9 mg/dL (ref 8.9–10.3)
Chloride: 110 mmol/L (ref 101–111)
Creatinine, Ser: 0.88 mg/dL (ref 0.44–1.00)
GFR calc Af Amer: >60 mL/min (ref >60)
GFR calc non Af Amer: >60 mL/min (ref >60)
Glucose, Bld: 100 mg/dL — ABNORMAL HIGH (ref 65–99)
Potassium: 3.7 mmol/L (ref 3.5–5.1)
Sodium: 139 mmol/L (ref 135–145)

## 2014-12-07 MED ORDER — DIPHENHYDRAMINE HCL 50 MG/ML IJ SOLN
12.5000 mg | Freq: Once | INTRAMUSCULAR | Status: AC
  Administered 2014-12-07: 12.5 mg via INTRAVENOUS
  Filled 2014-12-07: qty 1

## 2014-12-07 MED ORDER — METHYLPREDNISOLONE SODIUM SUCC 125 MG IJ SOLR
125.0000 mg | Freq: Once | INTRAMUSCULAR | Status: AC
  Administered 2014-12-07: 125 mg via INTRAVENOUS
  Filled 2014-12-07: qty 2

## 2014-12-07 MED ORDER — FAMOTIDINE IN NACL 20-0.9 MG/50ML-% IV SOLN
20.0000 mg | Freq: Once | INTRAVENOUS | Status: AC
  Administered 2014-12-07: 20 mg via INTRAVENOUS
  Filled 2014-12-07: qty 50

## 2014-12-07 NOTE — ED Provider Notes (Signed)
CSN: [id number]     Arrival date & time 12/07/14  0756 History   First MD Initiated Contact with Patient 12/07/14 0801     Chief Complaint  Patient presents with  . Allergic Reaction  . Shortness of Breath   64 y/o woman with history of numerous allergies presents with throat swelling and shortness of breath starting since last night. This initially worsened and was accompanied by wheals around her mouth. Symptoms were partially relieved with  benadryl. She woke several times during the night with SOB and transient palpitations. She believes this is associated with eating spaghetti with tomato sauce and onions at dinner. She has previously experienced mild shortness of breath after eating onions in the past and has voided them for several months. She has not changed any medications recently. She was not exposed to any insect bites or strings. She was feeling well until symptoms started last night. She denies any fever or chills. She has no cough, rhinorrhea, or other preexisting respiratory complaints.  (Consider location/radiation/quality/duration/timing/severity/associated sxs/prior Treatment) Patient is a 64 y.o. female presenting with allergic reaction and shortness of breath. The history is provided by the patient and a relative.  Allergic Reaction Presenting symptoms: difficulty breathing, itching and swelling   Presenting symptoms: no difficulty swallowing, no rash and no wheezing   Difficulty breathing:    Severity:  Mild   Onset quality:  Gradual   Timing:  Constant   Progression:  Partially resolved Itching:    Location:  Perioral and mouth   Severity:  Moderate   Onset quality:  Gradual   Progression:  Partially resolved Severity:  Moderate Prior allergic episodes:  Allergies to medications, seasonal allergies and food/nut allergies Context: food   Relieved by:  Antihistamines Shortness of Breath Associated symptoms: no chest pain, no cough, no fever, no rash, no sore  throat and no wheezing     Past Medical History  Diagnosis Date  . Migraines   . TIA (transient ischemic attack)   . Thyroid disease   . Palpitations   . Fibromyalgia   . IBS (irritable bowel syndrome)   . GERD (gastroesophageal reflux disease)   . Palpitations   . Diverticulitis   . Otitis externa   . Osteoarthritis   . Hypertension   . Hyperlipidemia   . Mitral regurgitation 02/28/2014    Mild to moderate by echo 02/2014   Past Surgical History  Procedure Laterality Date  . Abdominal hysterectomy    . Carpel tunnel    . Lbp gso     History reviewed. No pertinent family history. Social History  Substance Use Topics  . Smoking status: Never Smoker   . Smokeless tobacco: None  . Alcohol Use: 0.6 oz/week    1 Glasses of wine per week   OB History    No data available     Review of Systems  Constitutional: Negative for fever and chills.  HENT: Negative for congestion, rhinorrhea, sinus pressure, sore throat and trouble swallowing.   Respiratory: Positive for shortness of breath. Negative for cough, choking, chest tightness, wheezing and stridor.   Cardiovascular: Negative for chest pain.  Skin: Positive for itching. Negative for rash.  Allergic/Immunologic: Positive for food allergies.  Neurological: Negative for dizziness, weakness and light-headedness.    Allergies  Ivp dye; Rose; Shellfish allergy; Thimerosol; Strawberry; Avelox; Bacitracin; Cinnamon; Ginger; Onion; Other; Sunscreens; Watermelon; Cephalosporins; Codeine; and Penicillins  Home Medications   Prior to Admission medications   Medication Sig Start Date End Date  Taking? Authorizing Provider  acetaminophen (TYLENOL) 500 MG tablet Take 1,000 mg by mouth every 6 (six) hours as needed for mild pain or headache.   Yes Historical Provider, MD  diphenhydrAMINE (BENADRYL) 25 mg capsule Take 25 mg by mouth every 6 (six) hours as needed for allergies.   Yes Historical Provider, MD  EPINEPHrine (EPIPEN) 0.3  mg/0.3 mL IJ SOAJ injection Inject into the muscle once.   Yes Historical Provider, MD  levothyroxine (SYNTHROID, LEVOTHROID) 137 MCG tablet Take 137 mcg by mouth daily before breakfast.   Yes Historical Provider, MD  metoprolol succinate (TOPROL-XL) 25 MG 24 hr tablet Take 12.5 mg by mouth daily.   Yes Historical Provider, MD  pantoprazole (PROTONIX) 40 MG tablet Take 1 tablet (40 mg total) by mouth daily at 6 (six) AM. Patient not taking: Reported on 12/07/2014 02/11/14   Kathlen Mody, MD   BP 121/74 mmHg  Pulse 71  Temp(Src) 97.9 F (36.6 C) (Oral)  Resp 18  Ht 5\' 5"  (1.651 m)  Wt 208 lb (94.348 kg)  BMI 34.61 kg/m2  SpO2 99% Physical Exam  Constitutional: She appears well-developed and well-nourished.  HENT:  Mouth/Throat: No oral lesions. Uvula swelling present. Posterior oropharyngeal edema and posterior oropharyngeal erythema present. No oropharyngeal exudate.    Neck: No tracheal tenderness present.  Pulmonary/Chest: No stridor.  Mild respiratory distress but carries regular conversation without difficulty, no accessory muscle usage, no stridor or wheezes  Skin: Skin is warm, dry and intact. No rash noted. She is not diaphoretic. No pallor.    ED Course  Procedures (including critical care time) Labs Review Labs Reviewed  BASIC METABOLIC PANEL - Abnormal; Notable for the following:    Glucose, Bld 100 (*)    All other components within normal limits  CBC - Abnormal; Notable for the following:    RBC 5.57 (*)    Hemoglobin 17.4 (*)    HCT 49.6 (*)    Platelets 133 (*)    All other components within normal limits    Imaging Review No results found. I have personally reviewed and evaluated these images and lab results as part of my medical decision-making.   EKG Interpretation None      MDM   Final diagnoses:  Allergic reaction to food   64 y/o woman with numerous previously documented allergies to foods and medicines presents with acute onset of allergic  food reaction after eating onions. Her symptoms have been moderately controlled with low dose PO benadryl. From history of SOB and perioral rash symptoms seem less severe at time of presentation than they were last night. No other signs of additional acute process at this time. Patient is not on ACE-I or any other risk factor for angioedema. Vital signs BP 151/92 HR 83 RR 18 SpO2 99% on room air, with mild respiratory distress at examination. CBC demonstrated Hgb 17.2, WBC 4.7, Plts 133.  IV access obtained and patient given 12.5mg  benadryl, 20mg  pepcid, 125mg  solumedrol. Patient observed on continuous pulse oximetry monitoring for changes in respiratory status. Symptoms improved with patient resting comfortably at baseline on reexamination 90 minutes later. She is stable at this time and would be safe to discharge home with only benadryl as home medication. Instructions to return if condition deteriorates after returning home.   Additional finding of erythrocytosis is most likely secondary to recurrent episodes of hypoxia. On review she was referred to a sleep study early this year but has cancelled 2 appointments to date in March and  May. Sleep apnea would be the most likely explanation of her increased hematocrit. She does have a history of waking during the night with SOB. She was instructed to follow up with sleep medicine at [facility] with a new appointment for split-night sleep study. She expressed understanding of this plan and its importance.  Fuller Plan, MD 12/07/14 1041  Nelva Nay, MD 12/26/14 [phone]

## 2014-12-07 NOTE — ED Notes (Signed)
Per pt, states starting of allergic reaction last night.  Ate onions at around 7pm.  Has hx of multiple food allergies.  Began having shortness of breath with throat swelling.  Pt took benadryl last night and this morning.  Pt states shortness of breath and throat swelling this morning.  Able to talk and make full sentences.  Ambulatory and brought by private vehicle.

## 2014-12-07 NOTE — ED Notes (Signed)
Half an hour after IV meds administered pt stated the tightness in her throat began to lessen and she said she's started to feel better. MD aware

## 2014-12-07 NOTE — Discharge Instructions (Signed)
If mild itching and swelling return after this episode continue taking PO benadryl as needed. If symptoms become more severe return to emergency department for urgent treatment. Please continue to avoid known allergen exposures as able to avoid future episodes of shortness of breath.   Based on a review of your chart, your history of shortness of breath at night and daytime sleepiness, and on blood work today showing an increased red blood cell count it is very likely you have some degree of sleep apnea. Please call and schedule a split-night sleep study at your earliest available opportunity. This study may show a pattern of sleep apnea that can be treated to improve your symptoms. The results of this study can be discussed with your primary doctor to arrange for any medication or devices needed.

## 2015-01-07 ENCOUNTER — Ambulatory Visit (INDEPENDENT_AMBULATORY_CARE_PROVIDER_SITE_OTHER): Payer: PRIVATE HEALTH INSURANCE | Admitting: Allergy and Immunology

## 2015-01-07 ENCOUNTER — Encounter: Payer: Self-pay | Admitting: Allergy and Immunology

## 2015-01-07 VITALS — BP 122/68 | HR 74 | Temp 98.2°F | Resp 18 | Ht 65.0 in | Wt 198.0 lb

## 2015-01-07 DIAGNOSIS — T7800XA Anaphylactic reaction due to unspecified food, initial encounter: Secondary | ICD-10-CM | POA: Diagnosis not present

## 2015-01-07 DIAGNOSIS — T7840XA Allergy, unspecified, initial encounter: Secondary | ICD-10-CM | POA: Diagnosis not present

## 2015-01-07 NOTE — Progress Notes (Signed)
History of present illness: Problem  Allergic Reaction   This 64 year old female presents today for her initial consultation.  Heather Carey reports that she has a history of food allergies, including peanut, tree nut, strawberry, and shellfish, however since June she believes that she has had reactions with the consumption of multiple different foods.  She was bitten by a bat in early June and was treated with rabies vaccinations and immunoglobulin injections.  She believes that these injections may have triggered the onset of her new food-related allergic reactions.  She has been evaluated in the emergency department on 2 occasions since June for lip swelling, tongue swelling, throat swelling, and hives.  She reports that on occasion she chokes and vomits when eating.  Foods that have been a problem since June include: Watermelon, apples, ginger, cinnamon, orange, lettuce, chicken, beef, pork, strawberries, raspberries, and blackberries.  She reports that her diet has been limited to a, biscuits, and 2 pieces of bacon per day.  She is perplexed that she is able to consume bacon but has allergic reactions with other types of pork.  She has access to epinephrine autoinjector 2 pack.    Assessment and plan:     Problem List Items Addressed This Visit      Other   Allergic reaction - Primary    Urticaria with associated angioedema versus allergic reactions. Skin tests to select food allergens were negative today. The negative predictive value of food allergen skin testing is excellent (approximately 95%). While this does not appear to be an IgE mediated issue, skin testing does not rule out food intolerances. These etiologies are suggested when elimination of the responsible food leads to symptom resolution and re-introduction of the food is followed by the return of symptoms.   The following labs have been ordered: Tryptase, FCeRI antibody, TSH, anti-thyroglobulin antibody, thyroid peroxidase antibody, C4  level, C1 esterase level and function, C1q level, ESR, ANA, galactose-alpha-1,3-galactose IgE level, and serum specific IgE against select foods. Should symptoms recur, an additional lab order form has been provided for serum tryptase level which is to be drawn within 4 hours of symptom onset.  Should significant symptoms recur, a journal is to be kept recording any foods eaten, beverages consumed, and medications taken within a 6 hour time period prior to the onset of symptoms, as well as record activities being performed, and environmental conditions. For any symptoms concerning for anaphylaxis, epinephrine is to be administered and 911 is to be called immediately.  The patient has been encouraged to keep a careful symptom/food journal and eliminate any food suspected of correlating with symptoms. I have also recommended a trial of salicylate-free diet for at least 4 weeks while assessing for symptom reduction. An information sheet regarding salicylate-free diet has been provided and discussed.   The assistance of a dietician may be beneficial if her diet is limited by symptoms.  If laboratory results are unrevealing and her GI symptoms persist, gastroenterology evaluation may be warranted.      Relevant Orders   Allergy Test     Physical examination: Blood pressure 122/68, pulse 74, temperature 98.2 F (36.8 C), temperature source Oral, resp. rate 18, height _0  (1.651 m), weight 198 lb (89.812 kg).  General: Alert, interactive, in no acute distress. HEENT: TMs pearly gray, turbinates minimally edematous, post-pharynx moderately erythematous. Neck: Supple without lymphadenopathy. Lungs: Clear to auscultation without wheezing, rhonchi or rales. CV: Normal S1, S2 without murmurs. Skin: Warm and dry, without lesions or rashes. Extremities:  No clubbing, cyanosis or edema. Neuro:   Grossly intact.  Diagnositics: Percutaneous Skin Testing: Food allergen and environmental skin tests were  negative despite a positive histamine control.  Review of systems: Review of Systems  Constitutional: Negative for fever, chills and weight loss.  HENT: Negative for nosebleeds.   Eyes: Negative for blurred vision.  Cardiovascular: Negative for chest pain.  Gastrointestinal: Negative for heartburn, diarrhea and constipation.  Musculoskeletal: Negative for myalgias and joint pain.  Skin: Negative for rash.  Neurological: Negative for headaches.   Past medical history: Past Medical History  Diagnosis Date  . Migraines   . TIA (transient ischemic attack)   . Thyroid disease   . Palpitations   . Fibromyalgia   . IBS (irritable bowel syndrome)   . GERD (gastroesophageal reflux disease)   . Palpitations   . Diverticulitis   . Otitis externa   . Osteoarthritis   . Hypertension   . Hyperlipidemia   . Mitral regurgitation 02/28/2014    Mild to moderate by echo 02/2014    Past surgical history: Past Surgical History  Procedure Laterality Date  . Abdominal hysterectomy    . Carpel tunnel    . Lbp gso    . Tonsillectomy and adenoidectomy      Family history: Family History  Problem Relation Age of Onset  . Allergic rhinitis Mother   . Urticaria Mother   . Allergic rhinitis Father   . Asthma Father   . Allergic rhinitis Sister   . Angioedema Sister   . Urticaria Sister   . Allergic rhinitis Brother   . Asthma Brother     Social history:  Social History   Social History  . Marital Status: Married    Spouse Name: N/A  . Number of Children: N/A  . Years of Education: N/A   Occupational History  . Not on file.   Social History Main Topics  . Smoking status: Never Smoker   . Smokeless tobacco: Not on file  . Alcohol Use: 0.6 oz/week    1 Glasses of wine per week  . Drug Use: No  . Sexual Activity: Yes   Other Topics Concern  . Not on file   Social History Narrative    Environmental History: Pets in the home: dogs (1). Flooring: Carpeting in the  bedroom. Climate Control: central or room air conditioning  Known medication allergies: Allergies  Allergen Reactions  . Azithromycin Anaphylaxis  . Ivp Dye [Iodinated Diagnostic Agents] Anaphylaxis  . Rose Hives  . Shellfish Allergy Anaphylaxis  . Thimerosol [Thimerosal] Anaphylaxis  . Strawberry Swelling    Pt reports throat swelling and itching with any type of berry  . Avelox [Moxifloxacin Hcl In Nacl]     Irregular heart beat  . Bacitracin Itching and Other (See Comments)    Blisters on skin   . Cinnamon Swelling  . Ginger Swelling  . Onion   . Other Swelling    Nuts and ginger    . Sunscreens Swelling  . Watermelon [Citrullus Vulgaris]   . Cephalosporins Rash  . Codeine Rash  . Penicillins Rash    Outpatient medications:   Medication List       This list is accurate as of: 01/07/15  5:25 PM.  Always use your most recent med list.               acetaminophen 500 MG tablet  Commonly known as:  TYLENOL  Take 1,000 mg by mouth every 6 (six) hours as  needed for mild pain or headache.     diphenhydrAMINE 25 mg capsule  Commonly known as:  BENADRYL  Take 25 mg by mouth every 6 (six) hours as needed for allergies.     EPIPEN 0.3 mg/0.3 mL Soaj injection  Generic drug:  EPINEPHrine  Inject into the muscle once.     levothyroxine 137 MCG tablet  Commonly known as:  SYNTHROID, LEVOTHROID  Take 137 mcg by mouth daily before breakfast.     metoprolol succinate 25 MG 24 hr tablet  Commonly known as:  TOPROL-XL  Take 12.5 mg by mouth daily.     pantoprazole 40 MG tablet  Commonly known as:  PROTONIX  Take 1 tablet (40 mg total) by mouth daily at 6 (six) AM.        I appreciate the opportunity to take part in this Chenise's care. Please do not hesitate to contact me with questions.  Sincerely,   R. Edgar Frisk, MD

## 2015-01-07 NOTE — Assessment & Plan Note (Addendum)
Urticaria with associated angioedema versus allergic reactions. Skin tests to select food allergens were negative today. The negative predictive value of food allergen skin testing is excellent (approximately 95%). While this does not appear to be an IgE mediated issue, skin testing does not rule out food intolerances. These etiologies are suggested when elimination of the responsible food leads to symptom resolution and re-introduction of the food is followed by the return of symptoms.   The following labs have been ordered: Tryptase, FCeRI antibody, TSH, anti-thyroglobulin antibody, thyroid peroxidase antibody, C4 level, C1 esterase level and function, C1q level, ESR, ANA, galactose-alpha-1,3-galactose IgE level, and serum specific IgE against select foods. Should symptoms recur, an additional lab order form has been provided for serum tryptase level which is to be drawn within 4 hours of symptom onset.  Should significant symptoms recur, a journal is to be kept recording any foods eaten, beverages consumed, and medications taken within a 6 hour time period prior to the onset of symptoms, as well as record activities being performed, and environmental conditions. For any symptoms concerning for anaphylaxis, epinephrine is to be administered and 911 is to be called immediately.  The patient has been encouraged to keep a careful symptom/food journal and eliminate any food suspected of correlating with symptoms. I have also recommended a trial of salicylate-free diet for at least 4 weeks while assessing for symptom reduction. An information sheet regarding salicylate-free diet has been provided and discussed.   The assistance of a dietician may be beneficial if her diet is limited by symptoms.  If laboratory results are unrevealing and her GI symptoms persist, gastroenterology evaluation may be warranted.

## 2015-01-08 ENCOUNTER — Encounter: Payer: Self-pay | Admitting: Allergy and Immunology

## 2015-01-13 ENCOUNTER — Telehealth: Payer: Self-pay | Admitting: *Deleted

## 2015-01-13 ENCOUNTER — Other Ambulatory Visit: Payer: Self-pay | Admitting: Allergy and Immunology

## 2015-01-13 NOTE — Telephone Encounter (Signed)
Spoke with [place] at [employer] and she stated that she refrained from performing the Urea Breath Test.  The liquid the patient must drink before the test is citrus flavored and the patient has a possible citrus allergy.  She wanted to see what Dr. Nunzio Cobbs wanted to do first - Per Dr. Nunzio Cobbs, we will just hold off and not do the test at this time. I informed [place] of this.

## 2015-01-18 LAB — TOMATO IGE: Allergen Tomato, IgE: <0.1 kU/L

## 2015-01-18 LAB — ALLERGEN BANANA: Allergen Banana IgE: <0.1 kU/L

## 2015-01-18 LAB — ALLERGEN PECAN F201: Pecan Nut IgE: <0.1 kU/L

## 2015-01-18 LAB — ALLERGEN, WHEAT, F4: Wheat IgE: <0.1 kU/L

## 2015-01-18 LAB — ALLERGEN PROFILE, SHELLFISH
Clam IgE: <0.1 kU/L
F023-IgE Crab: <0.1 kU/L
F080-IgE Lobster: <0.1 kU/L
F290-IgE Oyster: <0.1 kU/L
Scallop IgE: <0.1 kU/L
Shrimp IgE: <0.1 kU/L

## 2015-01-18 LAB — F284-IGE TURKEY: Allergen Turkey IgE: <0.1 kU/L

## 2015-01-18 LAB — ALLERGEN, CABBAGE, F216: Allergen Cabbage IgE: <0.1 kU/L

## 2015-01-18 LAB — MILK IGE: Milk IgE: <0.1 kU/L

## 2015-01-18 LAB — IGE PEANUT W/COMPONENT REFLEX: Peanut IgE: <0.1 kU/L

## 2015-01-18 LAB — ALLERGEN, CINNAMON, RF220: Allergen Cinnamon IgE: <0.1 kU/L

## 2015-01-18 LAB — ALLERGEN, APPLE F49: Allergen Apple, IgE: <0.1 kU/L

## 2015-01-18 LAB — F215-IGE LETTUCE: Allergen Lettuce IgE: <0.1 kU/L

## 2015-01-18 LAB — ALLERGEN COCONUT IGE: Allergen Coconut IgE: <0.1 kU/L

## 2015-01-18 LAB — F087-IGE MELON: Allergen Melon IgE: <0.1 kU/L

## 2015-01-18 LAB — ALLERGEN HAZELNUT F17: Hazelnut (Filbert) IgE: <0.1 kU/L

## 2015-01-18 LAB — ALLERGEN, ONION, F48: Allergen Onion IgE: <0.1 kU/L

## 2015-01-18 LAB — ALLERGEN, ORANGE F33: Orange: <0.1 kU/L

## 2015-01-18 LAB — ALLERGEN, BRAZIL NUT, F18: Brazil Nut IgE: <0.1 kU/L

## 2015-01-18 LAB — ALLERGEN WALNUT F256: Walnut IgE: <0.1 kU/L

## 2015-01-18 LAB — ALLERGEN WATERMELON: Allergen Watermelon IgE: <0.1 kU/L

## 2015-01-18 LAB — F020-IGE ALMOND: F020-IgE Almond: <0.1 kU/L

## 2015-01-18 LAB — F263-IGE GREEN BELL PEPPER: Allergen Green Bell Pepper IgE: <0.1 kU/L

## 2015-01-18 LAB — ALLERGEN, CHICKEN F83: Chicken IgE: <0.1 kU/L

## 2015-01-18 LAB — ALLERGEN, WHITE POTATO,F35: Allergen Potato, White IgE: <0.1 kU/L

## 2015-01-18 LAB — ALLERGEN CARROT: Allergen Carrot IgE: <0.1 kU/L

## 2015-01-18 LAB — F202-IGE CASHEW NUT: F202-IgE Cashew Nut: <0.1 kU/L

## 2015-01-18 LAB — ALLERGEN, CORN F8: Allergen Corn, IgE: <0.1 kU/L

## 2015-01-18 LAB — ALLERGEN BEEF: Beef IgE: <0.1 kU/L

## 2015-01-18 LAB — ALLERGEN, STRAWBERRY, F44: Allergen Strawberry IgE: <0.1 kU/L

## 2015-01-18 LAB — ALLERGEN, GARLIC, F47: Allergen Garlic IgE: <0.1 kU/L

## 2015-01-18 LAB — ALLERGEN, BLACK PEPPER,F280: Allergen Black Pepper IgE: <0.1 kU/L

## 2015-01-18 LAB — F270-IGE GINGER: Allergen Ginger IgE: <0.1 kU/L

## 2015-01-18 LAB — ALLERGEN CELERY: Allergen Celery IgE: <0.1 kU/L

## 2015-01-23 ENCOUNTER — Encounter: Payer: PRIVATE HEALTH INSURANCE | Admitting: *Deleted

## 2015-01-23 LAB — ALPHA-GAL PANEL
Alpha Gal IgE*: <0.1 kU/L (ref <0.35)
Beef (Bos spp) IgE: <0.1 kU/L (ref <0.35)
Class Interpretation: 0
Class Interpretation: 0
Class Interpretation: 0
Lamb/Mutton (Ovis spp) IgE: <0.1 kU/L (ref <0.35)
Pork (Sus spp) IgE: <0.1 kU/L (ref <0.35)

## 2015-01-23 LAB — SEDIMENTATION RATE: Sed Rate: 5 mm/hr (ref 0–40)

## 2015-01-23 LAB — CHRONIC URTICARIA: cu index: <2.1 (ref <10)

## 2015-01-23 LAB — C4 COMPLEMENT: Complement C4, Serum: 23 mg/dL (ref 14–44)

## 2015-01-23 LAB — TRYPTASE: Tryptase: 4.5 ug/L (ref 2.2–13.2)

## 2015-01-23 LAB — ANA W/REFLEX IF POSITIVE: Anti Nuclear Antibody(ANA): NEGATIVE

## 2015-01-23 NOTE — Progress Notes (Signed)
This encounter was created in error - please disregard.

## 2015-01-26 ENCOUNTER — Telehealth: Payer: Self-pay | Admitting: Allergy and Immunology

## 2015-01-26 ENCOUNTER — Telehealth: Payer: Self-pay

## 2015-01-26 NOTE — Telephone Encounter (Signed)
Pt is calling about lab results. She sees the ones on her chart, the multiple foods.  She is wondering if results have come in from bloodwork for alpha gua and CBC pls advise.

## 2015-01-26 NOTE — Telephone Encounter (Signed)
CALLED PATIENT. GAVE ALL INFO.

## 2015-01-26 NOTE — Telephone Encounter (Signed)
-----   Message from Cristal Fordalph Carter Bobbitt, MD sent at 01/26/2015  8:58 AM EDT ----- Please inform the patient that all labs are negative/within normal limits. Please remind the patient continue to keep a symptom/exposure journal. Thanks.

## 2015-04-27 ENCOUNTER — Ambulatory Visit (INDEPENDENT_AMBULATORY_CARE_PROVIDER_SITE_OTHER): Payer: PRIVATE HEALTH INSURANCE | Admitting: Allergy and Immunology

## 2015-04-27 ENCOUNTER — Encounter: Payer: Self-pay | Admitting: Allergy and Immunology

## 2015-04-27 VITALS — BP 112/72 | HR 72 | Temp 98.4°F | Resp 12

## 2015-04-27 DIAGNOSIS — T783XXD Angioneurotic edema, subsequent encounter: Secondary | ICD-10-CM | POA: Diagnosis not present

## 2015-04-27 DIAGNOSIS — T783XXA Angioneurotic edema, initial encounter: Secondary | ICD-10-CM | POA: Insufficient documentation

## 2015-04-27 DIAGNOSIS — T7840XD Allergy, unspecified, subsequent encounter: Secondary | ICD-10-CM | POA: Diagnosis not present

## 2015-04-27 DIAGNOSIS — L500 Allergic urticaria: Secondary | ICD-10-CM | POA: Diagnosis not present

## 2015-04-27 MED ORDER — EPINEPHRINE 0.3 MG/0.3ML IJ SOAJ: 0.3000 mg | Freq: Once | INTRAMUSCULAR | Status: DC

## 2015-04-27 NOTE — Assessment & Plan Note (Addendum)
Urticaria with associated angioedema versus allergic reactions. All skin tests to select food allergens were negative as were screening labs for urticaria and angioedema.  Urticaria is often exacerbated by emotional stress, which her history suggests may be contributing.  Instructions have been provided for H1/H2 receptor blockade, titrating to the lowest effective dose necessary to suppress urticaria.  Should significant symptoms recur, a journal is to be kept recording any foods eaten, beverages consumed, and medications taken within a 6 hour time period prior to the onset of symptoms, as well as record activities being performed, and environmental conditions. For any symptoms concerning for anaphylaxis, epinephrine is to be administered and 911 is to be called immediately.  The assistance of a dietician or nutritionist may be beneficial if her diet is limited by symptoms.

## 2015-04-27 NOTE — Patient Instructions (Signed)
Urticaria with associated angioedema Urticaria with associated angioedema versus allergic reactions. All skin tests to select food allergens were negative as were screening labs for urticaria and angioedema.  Urticaria is often exacerbated by emotional stress, which her history suggests may be contributing.  Instructions have been provided for H1/H2 receptor blockade, titrating to the lowest effective dose necessary to suppress urticaria.  Should significant symptoms recur, a journal is to be kept recording any foods eaten, beverages consumed, and medications taken within a 6 hour time period prior to the onset of symptoms, as well as record activities being performed, and environmental conditions. For any symptoms concerning for anaphylaxis, epinephrine is to be administered and 911 is to be called immediately.  The assistance of a dietician or nutritionist may be beneficial if her diet is limited by symptoms.   Return in about 4 months (around 08/25/2015), or if symptoms worsen or fail to improve.  Urticaria (Hives)  . Cetirizine (Zyrtec) 10mg  once a day.  If symptoms continue then increase to .  Marland Kitchen. Cetirizine (Zyrtec) 10mg   twice a day.  If symptoms continue then increase to .  Marland Kitchen. Cetirizine (Zyrtec) 10mg   twice a day and Ranitidine (Zantac) 150 mg once a day.  If symptoms continue then increase to.  . Cetirizine (Zyrtec) 10mg   twice a day and Ranitidine (Zantac) 150 mg twice a day  May use Benadryl as needed for breakthrough symptoms       If no symptoms for 7 days, then step down dosage

## 2015-04-27 NOTE — Progress Notes (Signed)
Follow-up Note  RE: Coni Homesley MRN: [id number] DOB: April 29, 1950 Date of Office Visit: 04/27/2015  Primary care provider: Tawanna Solo, MD Referring provider: Kathyrn Lass, MD  History of present illness: HPI Comments: Heather Carey is a 65 y.o. female with a history of urticaria and angioedema presents today for follow up.  She reports that she continues to experience frequent episodes of urticaria and tongue angioedema.  The urticaria typically involves the face.  She believes that the symptoms are due to food allergies, though all epicutaneous food allergen tests were negative despite a positive histamine control and food allergen panel specific IgE was within normal limits.  All other urticaria and angioedema screening labs were negative/within normal limits, as well.  She has been unable to identify any correlation with foods containing salicylates.  She is concerned that her reaction may be related to pesticides, herbicides, fungicides, or possibly bacteria or fungus associated with certain foods.  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    Assessment and plan: Urticaria with associated angioedema Urticaria with associated angioedema versus allergic reactions. All skin tests to select food allergens were negative as were screening labs for urticaria and angioedema.  Urticaria is often exacerbated by emotional stress, which her history suggests may be contributing.  Instructions have been provided for H1/H2 receptor blockade, titrating to the lowest effective dose necessary to suppress urticaria.  Should significant symptoms recur, a journal is to be kept recording any foods eaten, beverages consumed, and medications taken within a 6 hour time period prior to the onset of symptoms, as well as record activities being  performed, and environmental conditions. For any symptoms concerning for anaphylaxis, epinephrine is to be administered and 911 is to be called immediately.  The assistance of a dietician or nutritionist may be beneficial if her diet is limited by symptoms.    Meds ordered this encounter  Medications  . EPINEPHrine 0.3 mg/0.3 mL IJ SOAJ injection    Sig: Inject 0.3 mLs (0.3 mg total) into the muscle once.    Dispense:  2 Device    Refill:  2    Diagnositics: The following labs were negative/within normal limits:  C4 level, FCeRI antibody, TSH, anti-thyroglobulin antibody, thyroid peroxidase antibody, tryptase, urea breath test, ESR, ANA, serum specific IgE against select foods, and galactose-alpha-1,3-galactose IgE level.    Physical examination: Blood pressure 112/72, pulse 72, temperature 98.4 F (36.9 C), temperature source Oral, resp. rate 12.  General: Alert, interactive, in no acute distress. Neck: Supple without lymphadenopathy. CV: Normal S1, S2 without murmurs. Skin: Warm and dry, without lesions or rashes.  The following portions of the patient's history were reviewed and updated as appropriate: allergies, current medications, past family history, past medical history, past social history, past surgical history and problem list.    Medication List       This list is accurate as of: 04/27/15  1:50 PM.  Always use your most recent med list.               acetaminophen 500 MG tablet  Commonly known as:  TYLENOL  Take 1,000 mg by mouth every 6 (six) hours as needed for mild pain or headache.     atorvastatin 10 MG tablet  Commonly known as:  LIPITOR  Take 1 tablet by mouth daily.  diphenhydrAMINE 25 mg capsule  Commonly known as:  BENADRYL  Take 25 mg by mouth every 6 (six) hours as needed for allergies.     EPINEPHrine 0.3 mg/0.3 mL Soaj injection  Commonly known as:  EPI-PEN  Inject 0.3 mLs (0.3 mg total) into the muscle once.     Famotidine 20 MG  Chew  Chew by mouth.     levothyroxine 137 MCG tablet  Commonly known as:  SYNTHROID, LEVOTHROID  Take 137 mcg by mouth daily before breakfast.     metoprolol succinate 25 MG 24 hr tablet  Commonly known as:  TOPROL-XL  Take 12.5 mg by mouth daily.     pantoprazole 40 MG tablet  Commonly known as:  PROTONIX  Take 1 tablet (40 mg total) by mouth daily at 6 (six) AM.        Allergies  Allergen Reactions  . Azithromycin Anaphylaxis  . Codeine Shortness Of Breath  . Eggs Or Egg-Derived Products     ITCHING AND SWELLING OF LIPS  . Ivp Dye [Iodinated Diagnostic Agents] Anaphylaxis  . Rose Hives  . Shellfish Allergy Anaphylaxis  . Thimerosol [Thimerosal] Anaphylaxis  . Strawberry Extract Swelling    Pt reports throat swelling and itching with any type of berry  . Avelox [Moxifloxacin Hcl In Nacl]     Irregular heart beat  . Bacitracin Itching and Other (See Comments)    Blisters on skin   . Cinnamon Swelling  . Ginger Swelling  . Onion     MOUTH SWELLING  . Other Swelling    Nuts and ginger    . Sunscreens Swelling  . Watermelon [Citrullus Vulgaris]     ITCHY MOUTH, LIP SWELLING  . Cephalosporins Rash  . Penicillins Hives    I appreciate the opportunity to take part in this Inesha's care. Please do not hesitate to contact me with questions.  Sincerely,   R. Edgar Frisk, MD

## 2015-04-27 NOTE — Assessment & Plan Note (Deleted)
Urticaria with associated angioedema versus allergic reactions. All skin tests to select food allergens were negative as were screening labs for urticaria and angioedema.  Urticaria is often exacerbated by emotional stress, which her history suggests may be contributing.  Should significant symptoms recur, a journal is to be kept recording any foods eaten, beverages consumed, and medications taken within a 6 hour time period prior to the onset of symptoms, as well as record activities being performed, and environmental conditions. For any symptoms concerning for anaphylaxis, epinephrine is to be administered and 911 is to be called immediately.  The assistance of a dietician or nutritionist may be beneficial if her diet is limited by symptoms.

## 2015-05-14 ENCOUNTER — Telehealth: Payer: Self-pay | Admitting: Allergy and Immunology

## 2015-05-14 NOTE — Telephone Encounter (Signed)
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 Patient said that the nutritionist said her body could not tolerate any histamines.  Advised that Dr Nunzio Cobbs would not be back in the office till Monday and patient said that was fine.

## 2015-05-14 NOTE — Telephone Encounter (Signed)
Pt call and would like to find out if you test about antihistamine tolerance. Call [phone].

## 2015-05-18 NOTE — Telephone Encounter (Signed)
Out bodies produce histamine. Some foods contain histamine, i.e. Strawberries. I'm certain that if the nutritionist has a comprehensive list of foods containing histamines which he/she can be provided for the patient. Thanks.

## 2015-05-18 NOTE — Telephone Encounter (Signed)
Patient notified and will contact us back with any other concerns.

## 2015-05-20 ENCOUNTER — Telehealth: Payer: Self-pay | Admitting: Allergy and Immunology

## 2015-05-20 NOTE — Telephone Encounter (Signed)
Pt told [facility] that she need to see a neurology at [facility], but there is nothing in her notes.

## 2015-05-20 NOTE — Telephone Encounter (Signed)
Please advise 

## 2015-05-22 NOTE — Telephone Encounter (Signed)
In my note I wrote: "The assistance of a dietician or nutritionist may be beneficial if her diet is limited by symptoms." I don't know what neurology has to do with it. Could she have confused nutritionist with neurologist? Thanks.

## 2015-05-22 NOTE — Telephone Encounter (Signed)
Spoke with patient who is asking why she is still swelling up with some foods. Patient was meaning a immunologist but will get that refferal from her PCP.

## 2015-07-17 ENCOUNTER — Ambulatory Visit
Admission: RE | Admit: 2015-07-17 | Discharge: 2015-07-17 | Disposition: A | Discharge status: Home / Self Care | Payer: PRIVATE HEALTH INSURANCE | Source: Ambulatory Visit | Attending: Family Medicine | Admitting: Family Medicine

## 2015-07-17 ENCOUNTER — Other Ambulatory Visit: Payer: Self-pay | Admitting: Family Medicine

## 2015-07-17 DIAGNOSIS — R52 Pain, unspecified: Secondary | ICD-10-CM

## 2015-07-17 DIAGNOSIS — M954 Acquired deformity of chest and rib: Secondary | ICD-10-CM

## 2015-08-28 ENCOUNTER — Encounter (HOSPITAL_COMMUNITY): Payer: Self-pay | Admitting: Oncology

## 2015-08-28 ENCOUNTER — Emergency Department (HOSPITAL_COMMUNITY)
Admission: EM | Admit: 2015-08-28 | Discharge: 2015-08-28 | Disposition: A | Discharge status: Home / Self Care | Payer: PRIVATE HEALTH INSURANCE | Attending: Emergency Medicine | Admitting: Emergency Medicine

## 2015-08-28 DIAGNOSIS — Z8673 Personal history of transient ischemic attack (TIA), and cerebral infarction without residual deficits: Secondary | ICD-10-CM | POA: Diagnosis not present

## 2015-08-28 DIAGNOSIS — Z91018 Allergy to other foods: Secondary | ICD-10-CM | POA: Diagnosis not present

## 2015-08-28 DIAGNOSIS — T7840XA Allergy, unspecified, initial encounter: Secondary | ICD-10-CM

## 2015-08-28 DIAGNOSIS — E785 Hyperlipidemia, unspecified: Secondary | ICD-10-CM | POA: Insufficient documentation

## 2015-08-28 DIAGNOSIS — T781XXA Other adverse food reactions, not elsewhere classified, initial encounter: Secondary | ICD-10-CM | POA: Diagnosis not present

## 2015-08-28 DIAGNOSIS — I10 Essential (primary) hypertension: Secondary | ICD-10-CM | POA: Diagnosis not present

## 2015-08-28 DIAGNOSIS — M1990 Unspecified osteoarthritis, unspecified site: Secondary | ICD-10-CM | POA: Insufficient documentation

## 2015-08-28 LAB — BASIC METABOLIC PANEL
Anion gap: 8 (ref 5–15)
BUN: 14 mg/dL (ref 6–20)
CO2: 24 mmol/L (ref 22–32)
Calcium: 9.1 mg/dL (ref 8.9–10.3)
Chloride: 108 mmol/L (ref 101–111)
Creatinine, Ser: 0.82 mg/dL (ref 0.44–1.00)
GFR calc Af Amer: >60 mL/min (ref >60)
GFR calc non Af Amer: >60 mL/min (ref >60)
Glucose, Bld: 118 mg/dL — ABNORMAL HIGH (ref 65–99)
Potassium: 3.6 mmol/L (ref 3.5–5.1)
Sodium: 140 mmol/L (ref 135–145)

## 2015-08-28 LAB — CBC WITH DIFFERENTIAL/PLATELET
Basophils Absolute: 0 10*3/uL (ref 0.0–0.1)
Basophils Relative: 0 %
Eosinophils Absolute: 0.2 10*3/uL (ref 0.0–0.7)
Eosinophils Relative: 3 %
HCT: 36.3 % (ref 36.0–46.0)
Hemoglobin: 12.6 g/dL (ref 12.0–15.0)
Lymphocytes Relative: 30 %
Lymphs Abs: 2.1 10*3/uL (ref 0.7–4.0)
MCH: 30.3 pg (ref 26.0–34.0)
MCHC: 34.7 g/dL (ref 30.0–36.0)
MCV: 87.3 fL (ref 78.0–100.0)
Monocytes Absolute: 0.4 10*3/uL (ref 0.1–1.0)
Monocytes Relative: 5 %
Neutro Abs: 4.2 10*3/uL (ref 1.7–7.7)
Neutrophils Relative %: 62 %
Platelets: 229 10*3/uL (ref 150–400)
RBC: 4.16 MIL/uL (ref 3.87–5.11)
RDW: 13.2 % (ref 11.5–15.5)
WBC: 7 10*3/uL (ref 4.0–10.5)

## 2015-08-28 MED ORDER — FAMOTIDINE IN NACL 20-0.9 MG/50ML-% IV SOLN
20.0000 mg | Freq: Once | INTRAVENOUS | Status: AC
  Administered 2015-08-28: 20 mg via INTRAVENOUS
  Filled 2015-08-28: qty 50

## 2015-08-28 MED ORDER — PREDNISONE 20 MG PO TABS: 40.0000 mg | ORAL_TABLET | Freq: Every day | ORAL | Status: DC

## 2015-08-28 MED ORDER — METHYLPREDNISOLONE SODIUM SUCC 125 MG IJ SOLR
125.0000 mg | Freq: Once | INTRAMUSCULAR | Status: AC
  Administered 2015-08-28: 125 mg via INTRAVENOUS
  Filled 2015-08-28: qty 2

## 2015-08-28 NOTE — ED Notes (Signed)
Pt c/o Heather Carey, feeling like she is going to "pass out", weakness, malaise, swelling to face and chills.  Pt is A&O x 4.  Pt has multiple allergies and came in contact w/ pecans approximately 2 hours ago.  Took 50 mg benadryl PTA.   Is speaking in full sentences.

## 2015-08-28 NOTE — Discharge Instructions (Signed)
Angioedema  Angioedema is a sudden swelling of tissues, often of the skin. It can occur on the face or genitals or in the abdomen or other body parts. The swelling usually develops over a short period and gets better in 24 to 48 hours. It often begins during the night and is found when the person wakes up. The person may also get red, itchy patches of skin (hives). Angioedema can be dangerous if it involves swelling of the air passages.   Depending on the cause, episodes of angioedema may only happen once, come back in unpredictable patterns, or repeat for several years and then gradually fade away.   CAUSES   Angioedema can be caused by an allergic reaction to various triggers. It can also result from nonallergic causes, including reactions to drugs, immune system disorders, viral infections, or an abnormal gene that is passed to you from your parents (hereditary). For some people with angioedema, the cause is unknown.   Some things that can trigger angioedema include:    Foods.    Medicines, such as ACE inhibitors, ARBs, nonsteroidal anti-inflammatory agents, or estrogen.    Latex.    Animal saliva.    Insect stings.    Dyes used in X-rays.    Mild injury.    Dental work.   Surgery.   Stress.    Sudden changes in temperature.    Exercise.  SIGNS AND SYMPTOMS    Swelling of the skin.   Hives. If these are present, there is also intense itching.   Redness in the affected area.    Pain in the affected area.   Swollen lips or tongue.   Breathing problems. This may happen if the air passages swell.   Wheezing.  If internal organs are involved, there may be:    Nausea.    Abdominal pain.    Vomiting.    Difficulty swallowing.    Difficulty passing urine.  DIAGNOSIS    Your health care provider will examine the affected area and take a medical and family history.   Various tests may be done to help determine the cause. Tests may include:   Allergy skin tests to see if the problem  is an allergic reaction.    Blood tests to check for hereditary angioedema.    Tests to check for underlying diseases that could cause the condition.    A review of your medicines, including over-the-counter medicines, may be done.  TREATMENT   Treatment will depend on the cause of the angioedema. Possible treatments include:    Removal of anything that triggered the condition (such as stopping certain medicines).    Medicines to treat symptoms or prevent attacks. Medicines given may include:     Antihistamines.     Epinephrine injection.     Steroids.    Hospitalization may be required for severe attacks. If the air passages are affected, it can be an emergency. Tubes may need to be placed to keep the airway open.  HOME CARE INSTRUCTIONS    Take all medicines as directed by your health care provider.   If you were given medicines for emergency allergy treatment, always carry them with you.   Wear a medical bracelet as directed by your health care provider.    Avoid known triggers.  SEEK MEDICAL CARE IF:    You have repeat attacks of angioedema.    Your attacks are more frequent or more severe despite preventive measures.    You have hereditary angioedema   and are considering having children. It is important to discuss with your health care provider the risks of passing the condition on to your children.  SEEK IMMEDIATE MEDICAL CARE IF:    You have severe swelling of the mouth, tongue, or lips.   You have difficulty breathing.    You have difficulty swallowing.    You faint.  MAKE SURE YOU:   Understand these instructions.   Will watch your condition.   Will get help right away if you are not doing well or get worse.     This information is not intended to replace advice given to you by your health care provider. Make sure you discuss any questions you have with your health care provider.     Document Released: 06/06/2001 Document Revised: 04/18/2014 Document Reviewed:  11/19/2012  Elsevier Interactive Patient Education 2016 [employer]

## 2015-08-28 NOTE — ED Notes (Signed)
Pt states she feels much better  Pt states she no longer has shortness of breath, chills, or the feeling she may pass out  Pt is resting quietly in bed  Visitor at bedside

## 2015-08-28 NOTE — ED Provider Notes (Signed)
CSN: [id number]     Arrival date & time 08/28/15  1924 History   First MD Initiated Contact with Patient 08/28/15 2026     Chief Complaint  Patient presents with  . Allergic Reaction     (Consider location/radiation/quality/duration/timing/severity/associated sxs/prior Treatment) HPI This is a 65 year old female who presents emergency Department with chief complaint of hives and angioedema. Patient states that it occurred earlier this evening just prior to arrival when she put her hand in a cookie bag and her mother. Patient states that she actually touched her lip and immediately felt her lips swelling up, which progressed her tongue and throat the face and ears. She felt her throat closing. She took 50 mg of rapid mouth Benadryl and called her husband who brought her here. She did not take her EpiPen, as her symptoms were resolving prior to arrival. Patient was given IV famotidine and IV Solu-Medrol part prior to my evaluation. The patient gives a history that she was bitten by a bat one year ago. She had the rabies immunoglobulin vaccination and since that time has had intermittent anaphylaxis, angioedema and urticaria. She has had multiple tests done with pulmonology, immunology, and hematology. She has had negative skin test, stating to allergens for food and environmental allergens. [facility].   Past Medical History  Diagnosis Date  . Migraines   . TIA (transient ischemic attack)   . Thyroid disease   . Palpitations   . Fibromyalgia   . IBS (irritable bowel syndrome)   . GERD (gastroesophageal reflux disease)   . Palpitations   . Diverticulitis   . Otitis externa   . Osteoarthritis   . Hypertension   . Hyperlipidemia   . Mitral regurgitation 02/28/2014    Mild to moderate by echo 02/2014   Past Surgical History  Procedure Laterality Date  . Abdominal hysterectomy    . Carpel tunnel    . Lbp gso    . Tonsillectomy and adenoidectomy     Family History   Problem Relation Age of Onset  . Allergic rhinitis Mother   . Urticaria Mother   . Allergic rhinitis Father   . Asthma Father   . Allergic rhinitis Sister   . Angioedema Sister   . Urticaria Sister   . Allergic rhinitis Brother   . Asthma Brother    Social History  Substance Use Topics  . Smoking status: Never Smoker   . Smokeless tobacco: Never Used  . Alcohol Use: 0.6 oz/week    1 Glasses of wine per week   OB History    No data available     Review of Systems  Ten systems reviewed and are negative for acute change, except as noted in the HPI.    Allergies  Azithromycin; Codeine; Eggs or egg-derived products; Ivp dye; Rose; Shellfish allergy; Thimerosol; Strawberry extract; Avelox; Bacitracin; Cinnamon; Ginger; Onion; Other; Sunscreens; Watermelon; Cephalosporins; and Penicillins  Home Medications   Prior to Admission medications   Medication Sig Start Date End Date Taking? Authorizing Provider  diphenhydrAMINE (BENADRYL) 25 mg capsule Take 25 mg by mouth every 6 (six) hours as needed for allergies.   Yes Historical Provider, MD  levothyroxine (SYNTHROID, LEVOTHROID) 137 MCG tablet Take 137 mcg by mouth daily before breakfast.   Yes Historical Provider, MD  metoprolol succinate (TOPROL-XL) 25 MG 24 hr tablet Take 12.5 mg by mouth daily.   Yes Historical Provider, MD  acetaminophen (TYLENOL) 500 MG tablet Take 1,000 mg by mouth every  6 (six) hours as needed for mild pain or headache.    Historical Provider, MD  EPINEPHrine 0.3 mg/0.3 mL IJ SOAJ injection Inject 0.3 mLs (0.3 mg total) into the muscle once. 04/27/15   Cristal Ford, MD  pantoprazole (PROTONIX) 40 MG tablet Take 1 tablet (40 mg total) by mouth daily at 6 (six) AM. Patient not taking: Reported on 12/07/2014 02/11/14   Kathlen Mody, MD   BP 159/84 mmHg  Pulse 104  Temp(Src) 98.1 F (36.7 C) (Oral)  Resp 22  Ht  (1.651 m)  Wt 74.844 kg  BMI 27.46 kg/m2  SpO2 100% Physical Exam Physical Exam   Nursing note and vitals reviewed. Constitutional: She is oriented to person, place, and time. She appears well-developed and well-nourished. No distress.  HENT:  Head: Normocephalic and atraumatic.  Eyes: Conjunctivae normal and EOM are normal. Pupils are equal, round, and reactive to light. No scleral icterus.  Neck: Normal range of motion.  Cardiovascular: Normal rate, regular rhythm and normal heart sounds.  Exam reveals no gallop and no friction rub.   No murmur heard. Pulmonary/Chest: Effort normal and breath sounds normal. No respiratory distress.  Abdominal: Soft. Bowel sounds are normal. She exhibits no distension and no mass. There is no tenderness. There is no guarding.  Neurological: She is alert and oriented to person, place, and time.  Skin: Skin is warm and dry. She is not diaphoretic.    ED Course  Procedures (including critical care time) Labs Review Labs Reviewed - No data to display  Imaging Review No results found. I have personally reviewed and evaluated these images and lab results as part of my medical decision-making.   EKG Interpretation None      MDM   Final diagnoses:  Allergic reaction, initial encounter    Patient re-evaluated prior to dc, is hemodynamically stable, in no respiratory distress, and denies the feeling of throat closing. Pt has been advised to take prednisone/OTC benadryl & return to the ED if they have a mod-severe allergic rxn (s/s including throat closing, difficulty breathing, swelling of lips face or tongue). Pt is to follow up with their PCP. Pt is agreeable with plan & verbalizes understanding.     Arthor Captain, PA-C 08/28/15 2227

## 2015-10-22 NOTE — ED Provider Notes (Signed)
Medical screening examination/treatment/procedure(s) were performed by non-physician practitioner and as supervising physician I was immediately available for consultation/collaboration.   EKG Interpretation   Date/Time:  Friday Aug 28 2015 19:53:50 EDT Ventricular Rate:  114 PR Interval:  140 QRS Duration: 95 QT Interval:  308 QTC Calculation: 424 R Axis:   67 Text Interpretation:  Sinus tachycardia Non-specific ST-t changes Abnormal  R-wave progression, early transition Although rate has increased since  last tracing Confirmed by RAY MD, Duwayne HeckANIELLE [phone]([id number]) on 08/29/2015 7:01:44 PM       Jacalyn LefevreJulie Mithcell Schumpert, MD 10/22/15 1432

## 2016-03-06 ENCOUNTER — Encounter (HOSPITAL_COMMUNITY): Payer: Self-pay

## 2016-03-06 ENCOUNTER — Emergency Department (HOSPITAL_COMMUNITY): Payer: PRIVATE HEALTH INSURANCE

## 2016-03-06 ENCOUNTER — Emergency Department (HOSPITAL_COMMUNITY)
Admission: EM | Admit: 2016-03-06 | Discharge: 2016-03-07 | Disposition: A | Discharge status: Home / Self Care | Payer: PRIVATE HEALTH INSURANCE | Attending: Emergency Medicine | Admitting: Emergency Medicine

## 2016-03-06 DIAGNOSIS — I10 Essential (primary) hypertension: Secondary | ICD-10-CM | POA: Insufficient documentation

## 2016-03-06 DIAGNOSIS — R55 Syncope and collapse: Secondary | ICD-10-CM | POA: Diagnosis not present

## 2016-03-06 DIAGNOSIS — Z79899 Other long term (current) drug therapy: Secondary | ICD-10-CM | POA: Diagnosis not present

## 2016-03-06 DIAGNOSIS — Z8673 Personal history of transient ischemic attack (TIA), and cerebral infarction without residual deficits: Secondary | ICD-10-CM | POA: Diagnosis not present

## 2016-03-06 LAB — URINALYSIS, ROUTINE W REFLEX MICROSCOPIC
Bilirubin Urine: NEGATIVE
Glucose, UA: NEGATIVE mg/dL
Hgb urine dipstick: NEGATIVE
Ketones, ur: NEGATIVE mg/dL
Nitrite: NEGATIVE
Protein, ur: NEGATIVE mg/dL
Specific Gravity, Urine: 1.006 (ref 1.005–1.030)
pH: 7 (ref 5.0–8.0)

## 2016-03-06 LAB — CBC
HCT: 37.5 % (ref 36.0–46.0)
Hemoglobin: 12.9 g/dL (ref 12.0–15.0)
MCH: 30.1 pg (ref 26.0–34.0)
MCHC: 34.4 g/dL (ref 30.0–36.0)
MCV: 87.4 fL (ref 78.0–100.0)
Platelets: 198 10*3/uL (ref 150–400)
RBC: 4.29 MIL/uL (ref 3.87–5.11)
RDW: 13.6 % (ref 11.5–15.5)
WBC: 5.9 10*3/uL (ref 4.0–10.5)

## 2016-03-06 LAB — BASIC METABOLIC PANEL
Anion gap: 8 (ref 5–15)
BUN: 8 mg/dL (ref 6–20)
CO2: 24 mmol/L (ref 22–32)
Calcium: 8.7 mg/dL — ABNORMAL LOW (ref 8.9–10.3)
Chloride: 101 mmol/L (ref 101–111)
Creatinine, Ser: 0.71 mg/dL (ref 0.44–1.00)
GFR calc Af Amer: >60 mL/min (ref >60)
GFR calc non Af Amer: >60 mL/min (ref >60)
Glucose, Bld: 107 mg/dL — ABNORMAL HIGH (ref 65–99)
Potassium: 3.5 mmol/L (ref 3.5–5.1)
Sodium: 133 mmol/L — ABNORMAL LOW (ref 135–145)

## 2016-03-06 LAB — I-STAT TROPONIN, ED: Troponin i, poc: 0.01 ng/mL (ref 0.00–0.08)

## 2016-03-06 LAB — URINE MICROSCOPIC-ADD ON

## 2016-03-06 MED ORDER — MORPHINE SULFATE (PF) 4 MG/ML IV SOLN
4.0000 mg | Freq: Once | INTRAVENOUS | Status: AC
  Administered 2016-03-06: 4 mg via INTRAVENOUS
  Filled 2016-03-06: qty 1

## 2016-03-06 MED ORDER — GADOBENATE DIMEGLUMINE 529 MG/ML IV SOLN
15.0000 mL | Freq: Once | INTRAVENOUS | Status: AC | PRN
  Administered 2016-03-06: 15 mL via INTRAVENOUS

## 2016-03-06 MED ORDER — PROCHLORPERAZINE EDISYLATE 5 MG/ML IJ SOLN
10.0000 mg | Freq: Once | INTRAMUSCULAR | Status: AC
  Administered 2016-03-06: 10 mg via INTRAVENOUS
  Filled 2016-03-06: qty 2

## 2016-03-06 MED ORDER — NITROGLYCERIN 0.4 MG SL SUBL: 0.4000 mg | SUBLINGUAL_TABLET | SUBLINGUAL | Status: DC | PRN

## 2016-03-06 MED ORDER — ASPIRIN 81 MG PO CHEW
324.0000 mg | CHEWABLE_TABLET | Freq: Once | ORAL | Status: AC
  Administered 2016-03-06: 324 mg via ORAL
  Filled 2016-03-06: qty 4

## 2016-03-06 MED ORDER — SODIUM CHLORIDE 0.9 % IV BOLUS (SEPSIS)
1000.0000 mL | Freq: Once | INTRAVENOUS | Status: AC
  Administered 2016-03-06: 1000 mL via INTRAVENOUS

## 2016-03-06 NOTE — ED Notes (Signed)
Patient transported to MRI 

## 2016-03-06 NOTE — ED Notes (Signed)
Patient returned from MRI.

## 2016-03-06 NOTE — ED Provider Notes (Signed)
[facility]-EMERGENCY DEPT Provider Note   CSN: [id number] Arrival date & time: 03/06/16  1333     History   Chief Complaint Chief Complaint  Patient presents with  . Loss of Consciousness    HPI   Blood pressure 130/83, pulse 83, temperature 98.2 F (36.8 C), temperature source Oral, resp. rate 16, height 5\' 5"  (1.651 m), weight 70.3 kg, SpO2 99 %.  Heather Carey is a 65 y.o. female complaining of Syncopal event just prior to arrival when she stood up, she had a nauseous and lightheaded sensation with tunnel vision, there was no prodrome of palpitations or chest pain. The event was unwitnessed, she was on her couch, there was no incontinence, she woke up and felt extremely weak, she had a difficult time standing up because her legs "felt like jelly." Patient has been in her normal state of health, eating and drinking normally, no fever, nausea vomiting or reduced by mouth intake. She has had an atypical headache for 1 week, she has a history of migraines but states this is very atypical, normally her migraine is right-sided periorbital but this is on the top of her head comments intermittent throughout the day, it's exacerbated by leaning forward, 4 out of 10 right now. She does have a cervicalgia which she's had for several weeks, states that when she turns her head to the left she feels a crunching and sharp pain in the Posterior posterior neck. She denies fevers, chills, change in vision, dysarthria, ataxia, chest pain. 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 She notes a right eye ptosis which she noticed this morning. She also states that she has a pain on the right cheek and right arm also noticed this morning, she describes this as a sharp pain and not weakness. She does have a history of "leaky heart valves" and is followed by Dr. Mayford Knife. No new medications, she takes Synthroid regularly. No new stresses. Denies history of ACS, PE,  DVT, recent immobilizations, calf pain, leg swelling, peripheral edema.   PCP [facility] Dr. Hyacinth Meeker Cards: [place]  Past Medical History:  Diagnosis Date  . Diverticulitis   . Fibromyalgia   . GERD (gastroesophageal reflux disease)   . Hyperlipidemia   . Hypertension   . IBS (irritable bowel syndrome)   . Migraines   . Mitral regurgitation 02/28/2014   Mild to moderate by echo 02/2014  . Osteoarthritis   . Otitis externa   . Palpitations   . Palpitations   . Thyroid disease   . TIA (transient ischemic attack)     Patient Active Problem List   Diagnosis Date Noted  . Urticaria with associated angioedema 04/27/2015  . Angioedema 04/27/2015  . Allergic reaction 01/07/2015  . Food allergy 01/07/2015  . Mitral regurgitation 02/28/2014  . MVP (mitral valve prolapse) 02/24/2014  . SOB (shortness of breath) 02/24/2014  . Snoring 02/24/2014  . Hypertension   . Hyperlipidemia   . Chest pain 02/10/2014    Past Surgical History:  Procedure Laterality Date  . ABDOMINAL HYSTERECTOMY    . carpel tunnel    . lbp gso    . TONSILLECTOMY AND ADENOIDECTOMY      OB History    No data available       Home Medications    Prior to Admission medications   Medication Sig Start Date End Date Taking? Authorizing Provider  diphenhydrAMINE (BENADRYL) 25 mg capsule Take 25 mg by  mouth every 6 (six) hours as needed for allergies.   Yes Historical Provider, MD  EPINEPHrine 0.3 mg/0.3 mL IJ SOAJ injection Inject 0.3 mLs (0.3 mg total) into the muscle once. 04/27/15  Yes Cristal Fordalph Carter Bobbitt, MD  levothyroxine (SYNTHROID, LEVOTHROID) 137 MCG tablet Take 137 mcg by mouth daily before breakfast.   Yes Historical Provider, MD  metoprolol succinate (TOPROL-XL) 25 MG 24 hr tablet Take 12.5 mg by mouth daily.   Yes Historical Provider, MD  pantoprazole (PROTONIX) 40 MG tablet Take 1 tablet (40 mg total) by mouth daily at 6 (six) AM. Patient not taking: Reported on 03/06/2016 02/11/14   Kathlen ModyVijaya  Akula, MD  predniSONE (DELTASONE) 20 MG tablet Take 2 tablets (40 mg total) by mouth daily. Patient not taking: Reported on 03/06/2016 08/28/15   Arthor CaptainAbigail Harris, PA-C    Family History Family History  Problem Relation Age of Onset  . Allergic rhinitis Mother   . Urticaria Mother   . Allergic rhinitis Father   . Asthma Father   . Allergic rhinitis Sister   . Angioedema Sister   . Urticaria Sister   . Allergic rhinitis Brother   . Asthma Brother     Social History Social History  Substance Use Topics  . Smoking status: Never Smoker  . Smokeless tobacco: Never Used  . Alcohol use 0.6 oz/week    1 Glasses of wine per week     Allergies   Azithromycin; Codeine; Eggs or egg-derived products; Ivp dye [iodinated diagnostic agents]; Rose; Shellfish allergy; Thimerosol [thimerosal]; Strawberry extract; Avelox [moxifloxacin hcl in nacl]; Bacitracin; Benzalkonium chloride; Cinnamon; Ginger; Onion; Other; Sunscreens; Watermelon [citrullus vulgaris]; Cephalosporins; and Penicillins   Review of Systems Review of Systems  10 systems reviewed and found to be negative, except as noted in the HPI.  Physical Exam Updated Vital Signs BP 125/87   Pulse 88   Temp 98.2 F (36.8 C) (Oral)   Resp 20   Ht 5\' 5"  (1.651 m)   Wt 70.3 kg   SpO2 96%   BMI 25.79 kg/m   Physical Exam  Constitutional: She is oriented to person, place, and time. She appears well-developed and well-nourished.  HENT:  Head: Normocephalic and atraumatic.  Mouth/Throat: Oropharynx is clear and moist.  Eyes: Conjunctivae and EOM are normal. Pupils are equal, round, and reactive to light.  No TTP of maxillary or frontal sinuses  No TTP or induration of temporal arteries bilaterally  Neck: Normal range of motion. Neck supple.  FROM to C-spine. Pt can touch chin to chest without discomfort. No TTP of midline cervical spine.   Cardiovascular: Normal rate, regular rhythm and intact distal pulses.   Pulmonary/Chest:  Effort normal and breath sounds normal. No respiratory distress. She has no wheezes. She has no rales. She exhibits no tenderness.  Abdominal: Soft. Bowel sounds are normal. There is no tenderness.  Musculoskeletal: Normal range of motion. She exhibits no edema or tenderness.  Neurological: She is alert and oriented to person, place, and time. No cranial nerve deficit.  II-Visual fields grossly intact. III/IV/VI-Extraocular movements intact.  Pupils reactive bilaterally. V/VII-Smile symmetric, equal eyebrow raise,  facial sensation intact VIII- Hearing grossly intact (patient reports a decreased sensation on right face, right arm and right leg) IX/X-Normal gag XI-bilateral shoulder shrug XII-midline tongue extension Motor: 5/5 bilaterally with normal tone and bulk Cerebellar: Normal finger-to-nose  and normal heel-to-shin test.   Romberg negative Ambulates with a coordinated gait   Nursing note and vitals reviewed.  ED Treatments / Results  Labs (all labs ordered are listed, but only abnormal results are displayed) Labs Reviewed  BASIC METABOLIC PANEL - Abnormal; Notable for the following:       Result Value   Sodium 133 (*)    Glucose, Bld 107 (*)    Calcium 8.7 (*)    All other components within normal limits  URINALYSIS, ROUTINE W REFLEX MICROSCOPIC (NOT AT [facility]) - Abnormal; Notable for the following:    Leukocytes, UA MODERATE (*)    All other components within normal limits  URINE MICROSCOPIC-ADD ON - Abnormal; Notable for the following:    Squamous Epithelial / LPF 0-5 (*)    Bacteria, UA RARE (*)    All other components within normal limits  CBC  I-STAT TROPOININ, ED    EKG  EKG Interpretation  Date/Time:  Sunday March 06 2016 13:46:47 EST Ventricular Rate:  82 PR Interval:  132 QRS Duration: 78 QT Interval:  382 QTC Calculation: 446 R Axis:   59 Text Interpretation:  Normal sinus rhythm Normal ECG since previous tracing, tachycardia resolved no  significant change from older EKGs Confirmed by LITTLE MD, RACHEL [phone]) on 03/06/2016 4:25:14 PM       Radiology Ct Head Wo Contrast  Result Date: 03/06/2016 CLINICAL DATA:  Headache for 1 week. Syncopal episode today at noon. EXAM: CT HEAD WITHOUT CONTRAST TECHNIQUE: Contiguous axial images were obtained from the base of the skull through the vertex without intravenous contrast. COMPARISON:  MRI brain 02/17/2011. FINDINGS: Brain: No acute infarct, hemorrhage, or mass lesion is present. The ventricles are of normal size. No significant extraaxial fluid collection is present. No significant white matter disease is evident by CT. Vascular: No hyperdense vessel or unexpected calcification. Skull: Hyperostosis frontalis internus is noted. The calvarium is otherwise within normal limits. Sinuses/Orbits: The paranasal sinuses and mastoid air cells are clear. The globes and orbits are intact. IMPRESSION: Negative CT of the head. Electronically Signed   By: Marin Roberts M.D.   On: 03/06/2016 17:12   Mr Angiogram Head Wo Contrast  Result Date: 03/06/2016 CLINICAL DATA:  Syncopal episode today lunch. Headache for 1 week. Vertigo after standing. Right-sided numbness. EXAM: MRI HEAD WITHOUT CONTRAST MRA HEAD WITHOUT CONTRAST TECHNIQUE: Multiplanar, multiecho pulse sequences of the brain and surrounding structures were obtained without intravenous contrast. Angiographic images of the head were obtained using MRA technique without contrast. COMPARISON:  CT head without contrast the same day. MRI brain 02/17/2011 FINDINGS: MRI HEAD FINDINGS Brain: Diffusion-weighted images demonstrate no evidence for acute or subacute infarction. No acute hemorrhage or mass lesion is present. The ventricles are of normal size. No significant extra-axial fluid collection is present. Scattered subcortical T2 hyperintensities are mildly advanced for age. The internal auditory canals are within normal limits bilaterally. The  brainstem and cerebellum are normal. Enlarged empty sella is stable. Vascular: Flow is present in the major intracranial arteries. Skull and upper cervical spine: The skullbase is normal. Midline sagittal structures are otherwise within normal limits. The upper cervical spine is normal. 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 Sinuses/Orbits: The paranasal sinuses are clear. There is some fluid in left mastoid air cells. The right mastoid air cells are clear. MRA HEAD FINDINGS Internal carotid arteries are within normal limits from the high cervical segments through the ICA termini bilaterally. The A1 and M1 segments are normal. The anterior communicating artery is patent. The MCA bifurcations  are intact. ACA and MCA branch vessels are normal. The right vertebral artery is slightly dominant left. The right PICA origin is visualized and normal. The left PICA origin is not visualized. The basilar artery is normal. Both posterior cerebral arteries originate from the basilar tip. PCA branch vessels are within normal limits. IMPRESSION: 1. Slight progression of scattered subcortical T2 hyperintensities bilaterally, mildly advanced for age. The finding is nonspecific but can be seen in the setting of chronic microvascular ischemia, a demyelinating process such as multiple sclerosis, vasculitis, complicated migraine headaches, or as the sequelae of a prior infectious or inflammatory process. 2. Chronic left mastoid effusion. No obstructing nasopharyngeal lesion is present. 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 3. No acute intracranial abnormality. 4. Normal variant MRA circle of Willis without significant proximal stenosis, aneurysm, or branch vessel occlusion. Electronically Signed   By: Marin Roberts M.D.   On: 03/06/2016 21:19   Mr Angiogram Neck W Or Wo Contrast  Result Date: 03/06/2016 CLINICAL DATA:  65 y/o  F; syncopal episode with neck pain and right-sided numbness. EXAM: MRA NECK WITHOUT AND WITH CONTRAST TECHNIQUE: Multiplanar and multiecho pulse sequences of the neck were obtained without and with intravenous contrast. Angiographic images of the neck were obtained using MRA technique without and with intravenous contrast. CONTRAST:  15mL MULTIHANCE GADOBENATE DIMEGLUMINE 529 MG/ML IV SOLN COMPARISON:  None. FINDINGS: Aortic arch: No occlusion, aneurysm, dissection, or significant stenosis is identified. Standard arch anatomy. Right common carotid artery: No occlusion, aneurysm, dissection, or significant stenosis is identified. Right internal carotid artery: No occlusion, aneurysm, dissection, or significant stenosis is identified. Right vertebral artery: No occlusion, aneurysm, dissection, or significant stenosis is identified. Left common carotid artery: No occlusion, aneurysm, dissection, or significant stenosis is identified. Left Internal carotid artery: No occlusion, aneurysm, dissection, or significant stenosis is identified. Left Vertebral artery: No occlusion, aneurysm, dissection, or significant stenosis is identified. IMPRESSION: Carotid and vertebral arteries of the neck are patent without evidence for occlusion, aneurysm, dissection, or hemodynamically significant stenosis. Electronically Signed   By: Mitzi Hansen M.D.   On: 03/06/2016 23:59   Mr Brain Wo Contrast  Result Date: 03/06/2016 CLINICAL DATA:  Syncopal episode today lunch. Headache for 1 week. Vertigo after standing. Right-sided numbness. EXAM: MRI HEAD WITHOUT CONTRAST MRA HEAD WITHOUT CONTRAST TECHNIQUE: Multiplanar, multiecho pulse sequences of the brain and surrounding structures were obtained without intravenous contrast. Angiographic images of the head were obtained using MRA technique without contrast. COMPARISON:  CT head without contrast the same day. MRI brain 02/17/2011 FINDINGS: MRI HEAD FINDINGS Brain:  Diffusion-weighted images demonstrate no evidence for acute or subacute infarction. No acute hemorrhage or mass lesion is present. The ventricles are of normal size. No significant extra-axial fluid collection is present. Scattered subcortical T2 hyperintensities are mildly advanced for age. The internal auditory canals are within normal limits bilaterally. The brainstem and cerebellum are normal. Enlarged empty sella is stable. Vascular: Flow is present in the major intracranial arteries. Skull and upper cervical spine: The skullbase is normal. Midline sagittal structures are otherwise within normal limits. The upper cervical spine is normal. 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 Sinuses/Orbits: The paranasal sinuses are clear. There is some fluid in left mastoid air cells. The right mastoid air cells are clear. MRA HEAD FINDINGS Internal carotid arteries are within normal limits from the high cervical segments through the ICA termini bilaterally. The  A1 and M1 segments are normal. The anterior communicating artery is patent. The MCA bifurcations are intact. ACA and MCA branch vessels are normal. The right vertebral artery is slightly dominant left. The right PICA origin is visualized and normal. The left PICA origin is not visualized. The basilar artery is normal. Both posterior cerebral arteries originate from the basilar tip. PCA branch vessels are within normal limits. IMPRESSION: 1. Slight progression of scattered subcortical T2 hyperintensities bilaterally, mildly advanced for age. The finding is nonspecific but can be seen in the setting of chronic microvascular ischemia, a demyelinating process such as multiple sclerosis, vasculitis, complicated migraine headaches, or as the sequelae of a prior infectious or inflammatory process. 2. Chronic left mastoid effusion. No obstructing nasopharyngeal lesion is present. A Tornwaldt cyst  is noted just medial to the station tube. 3. No acute intracranial abnormality. 4. Normal variant MRA circle of Willis without significant proximal stenosis, aneurysm, or branch vessel occlusion. Electronically Signed   By: Marin Roberts M.D.   On: 03/06/2016 21:19    Procedures Procedures (including critical care time)  Medications Ordered in ED Medications  nitroGLYCERIN (NITROSTAT) SL tablet 0.4 mg (not administered)  sodium chloride 0.9 % bolus 1,000 mL (0 mLs Intravenous Stopped 03/06/16 1654)  prochlorperazine (COMPAZINE) injection 10 mg (10 mg Intravenous Given 03/06/16 2219)  morphine 4 MG/ML injection 4 mg (4 mg Intravenous Given 03/06/16 2219)  aspirin chewable tablet 324 mg (324 mg Oral Given 03/06/16 2234)  gadobenate dimeglumine (MULTIHANCE) injection 15 mL (15 mLs Intravenous Contrast Given 03/06/16 2345)     Initial Impression / Assessment and Plan / ED Course  I have reviewed the triage vital signs and the nursing notes.  Pertinent labs & imaging results that were available during my care of the patient were reviewed by me and considered in my medical decision making (see chart for details).  Clinical Course    Vitals:   03/06/16 2200 03/06/16 2215 03/06/16 2230 03/06/16 2245  BP: 122/89 119/80 125/78 125/87  Pulse: 72 73 88 88  Resp:   11 20  Temp:      TempSrc:      SpO2: 100% 99% 100% 96%  Weight:      Height:        Medications  nitroGLYCERIN (NITROSTAT) SL tablet 0.4 mg (not administered)  sodium chloride 0.9 % bolus 1,000 mL (0 mLs Intravenous Stopped 03/06/16 1654)  prochlorperazine (COMPAZINE) injection 10 mg (10 mg Intravenous Given 03/06/16 2219)  morphine 4 MG/ML injection 4 mg (4 mg Intravenous Given 03/06/16 2219)  aspirin chewable tablet 324 mg (324 mg Oral Given 03/06/16 2234)  gadobenate dimeglumine (MULTIHANCE) injection 15 mL (15 mLs Intravenous Contrast Given 03/06/16 2345)    Heather Carey is 65 y.o. female presenting with  syncopal event, this does not appear to be seizure. Patient with multiple neurologic complaints including right eye ptosis, she's also complaining of a decreased sensation on the right face, right upper extremity right lower extremity. EKG with no acute changes, head CT negative, blood work reassuring, urinalysis abnormality.  Neurology consult from Dr. Amada Jupiter appreciated: He recommends MRI/MRA.  This is a shared visit with the attending physician who personally evaluated the patient and agrees with the care plan.   MRI with slight progression of scattered subcortical T2 hyperintensities bilaterally chronic microvascular ischemia demyelinating process complicated migraines.   Discussed with Dr. Amada Jupiter who would also recommend obtaining a MR a of the neck to further evaluate for dissection,  Patient is reporting  a significant increase in her headache, she is also reporting chest pain after the MRI. EKG obtained which shows sinus tachycardia minimal ST depressions in diffuse leads. Patient will be given full dose aspirin and nitroglycerin.  MRA of neck without abnormality, chest pain-free, states that the pain only lasted a few minutes, I doubt this is ACS, repeat EKG without changes. Feels comfortable going home to follow with primary care, also asked her to check in with her cardiologist next week.  Discussed case with attending physician who agrees with care plan and disposition.   Evaluation does not show pathology that would require ongoing emergent intervention or inpatient treatment. Pt is hemodynamically stable and mentating appropriately. Discussed findings and plan with patient/guardian, who agrees with care plan. All questions answered. Return precautions discussed and outpatient follow up given.    Final Clinical Impressions(s) / ED Diagnoses   Final diagnoses:  Syncope and collapse    New Prescriptions New Prescriptions   No medications on file     Wynetta Emeryicole Vanassa Penniman,  PA-C 03/07/16 0024    Laurence Spatesachel Morgan Little, MD 03/07/16 [phone]

## 2016-03-06 NOTE — ED Notes (Signed)
Pt in MRI.

## 2016-03-06 NOTE — ED Notes (Signed)
Pt complaining of "pressure" in chest.  "cant breathe"  sats fell to 80s.  Placed on 2L [place]. Heart rate elevated in 120s.  EKG captured and EDP notified.

## 2016-03-06 NOTE — ED Triage Notes (Signed)
Patient had syncopal event during lunch today, unwitnessed by family. Patient states that she stood and the room started spinning and she fell against the couch, reports that she was out for just a short period. Denies pain. Also reports that she has had burning/stinging pain to right side of face and shoulder and right side of temple for the past week. On arrival alert and oriented, complains of fatigue

## 2016-03-06 NOTE — ED Notes (Signed)
Pt has no complaints of head pain and no complaints of pressure or pain in the chest at this time.

## 2016-03-06 NOTE — ED Notes (Signed)
Pt in MRI at this time.  Family updated

## 2016-03-07 NOTE — Discharge Instructions (Signed)
Please follow with your primary care doctor in the next 2 days for a check-up. They must obtain records for further management.  ° °Do not hesitate to return to the Emergency Department for any new, worsening or concerning symptoms.  ° °

## 2016-03-08 ENCOUNTER — Encounter (HOSPITAL_COMMUNITY): Payer: Self-pay | Admitting: Emergency Medicine

## 2016-03-08 ENCOUNTER — Inpatient Hospital Stay (HOSPITAL_COMMUNITY)
Admission: EM | Admit: 2016-03-08 | Discharge: 2016-03-10 | DRG: 641 | Disposition: A | Discharge status: Home / Self Care | Payer: PRIVATE HEALTH INSURANCE | Attending: Internal Medicine | Admitting: Internal Medicine

## 2016-03-08 ENCOUNTER — Emergency Department (HOSPITAL_COMMUNITY): Payer: PRIVATE HEALTH INSURANCE

## 2016-03-08 DIAGNOSIS — Z91041 Radiographic dye allergy status: Secondary | ICD-10-CM

## 2016-03-08 DIAGNOSIS — E538 Deficiency of other specified B group vitamins: Secondary | ICD-10-CM | POA: Diagnosis present

## 2016-03-08 DIAGNOSIS — Z91013 Allergy to seafood: Secondary | ICD-10-CM

## 2016-03-08 DIAGNOSIS — I10 Essential (primary) hypertension: Secondary | ICD-10-CM | POA: Diagnosis present

## 2016-03-08 DIAGNOSIS — L509 Urticaria, unspecified: Secondary | ICD-10-CM | POA: Diagnosis present

## 2016-03-08 DIAGNOSIS — E785 Hyperlipidemia, unspecified: Secondary | ICD-10-CM | POA: Diagnosis present

## 2016-03-08 DIAGNOSIS — M797 Fibromyalgia: Secondary | ICD-10-CM | POA: Diagnosis present

## 2016-03-08 DIAGNOSIS — R55 Syncope and collapse: Secondary | ICD-10-CM | POA: Diagnosis not present

## 2016-03-08 DIAGNOSIS — R209 Unspecified disturbances of skin sensation: Secondary | ICD-10-CM | POA: Diagnosis not present

## 2016-03-08 DIAGNOSIS — Z881 Allergy status to other antibiotic agents status: Secondary | ICD-10-CM

## 2016-03-08 DIAGNOSIS — Z91018 Allergy to other foods: Secondary | ICD-10-CM

## 2016-03-08 DIAGNOSIS — Z825 Family history of asthma and other chronic lower respiratory diseases: Secondary | ICD-10-CM

## 2016-03-08 DIAGNOSIS — Z79899 Other long term (current) drug therapy: Secondary | ICD-10-CM

## 2016-03-08 DIAGNOSIS — E440 Moderate protein-calorie malnutrition: Secondary | ICD-10-CM | POA: Insufficient documentation

## 2016-03-08 DIAGNOSIS — I340 Nonrheumatic mitral (valve) insufficiency: Secondary | ICD-10-CM | POA: Diagnosis present

## 2016-03-08 DIAGNOSIS — Z91012 Allergy to eggs: Secondary | ICD-10-CM

## 2016-03-08 DIAGNOSIS — K219 Gastro-esophageal reflux disease without esophagitis: Secondary | ICD-10-CM | POA: Diagnosis present

## 2016-03-08 DIAGNOSIS — E869 Volume depletion, unspecified: Secondary | ICD-10-CM | POA: Diagnosis not present

## 2016-03-08 DIAGNOSIS — Z8673 Personal history of transient ischemic attack (TIA), and cerebral infarction without residual deficits: Secondary | ICD-10-CM

## 2016-03-08 DIAGNOSIS — Z7952 Long term (current) use of systemic steroids: Secondary | ICD-10-CM

## 2016-03-08 DIAGNOSIS — R1310 Dysphagia, unspecified: Secondary | ICD-10-CM

## 2016-03-08 DIAGNOSIS — E038 Other specified hypothyroidism: Secondary | ICD-10-CM | POA: Diagnosis not present

## 2016-03-08 DIAGNOSIS — R079 Chest pain, unspecified: Secondary | ICD-10-CM | POA: Diagnosis present

## 2016-03-08 DIAGNOSIS — E039 Hypothyroidism, unspecified: Secondary | ICD-10-CM | POA: Diagnosis present

## 2016-03-08 DIAGNOSIS — Z885 Allergy status to narcotic agent status: Secondary | ICD-10-CM

## 2016-03-08 DIAGNOSIS — R1312 Dysphagia, oropharyngeal phase: Secondary | ICD-10-CM | POA: Diagnosis present

## 2016-03-08 DIAGNOSIS — Z880 Allergy status to penicillin: Secondary | ICD-10-CM

## 2016-03-08 DIAGNOSIS — K200 Eosinophilic esophagitis: Secondary | ICD-10-CM | POA: Diagnosis present

## 2016-03-08 LAB — COMPREHENSIVE METABOLIC PANEL
ALT: 14 U/L (ref 14–54)
AST: 18 U/L (ref 15–41)
Albumin: 3.9 g/dL (ref 3.5–5.0)
Alkaline Phosphatase: 45 U/L (ref 38–126)
Anion gap: 8 (ref 5–15)
BUN: 7 mg/dL (ref 6–20)
CO2: 24 mmol/L (ref 22–32)
Calcium: 9.1 mg/dL (ref 8.9–10.3)
Chloride: 110 mmol/L (ref 101–111)
Creatinine, Ser: 1.02 mg/dL — ABNORMAL HIGH (ref 0.44–1.00)
GFR calc Af Amer: >60 mL/min (ref >60)
GFR calc non Af Amer: 57 mL/min — ABNORMAL LOW (ref >60)
Glucose, Bld: 104 mg/dL — ABNORMAL HIGH (ref 65–99)
Potassium: 3.5 mmol/L (ref 3.5–5.1)
Sodium: 142 mmol/L (ref 135–145)
Total Bilirubin: 0.6 mg/dL (ref 0.3–1.2)
Total Protein: 6.2 g/dL — ABNORMAL LOW (ref 6.5–8.1)

## 2016-03-08 LAB — URINE MICROSCOPIC-ADD ON
Bacteria, UA: NONE SEEN
RBC / HPF: NONE SEEN RBC/hpf (ref 0–5)

## 2016-03-08 LAB — D-DIMER, QUANTITATIVE: D-Dimer, Quant: 0.4 ug/mL-FEU (ref 0.00–0.50)

## 2016-03-08 LAB — CBC WITH DIFFERENTIAL/PLATELET
Basophils Absolute: 0 10*3/uL (ref 0.0–0.1)
Basophils Relative: 0 %
Eosinophils Absolute: 0.2 10*3/uL (ref 0.0–0.7)
Eosinophils Relative: 2 %
HCT: 36.3 % (ref 36.0–46.0)
Hemoglobin: 13 g/dL (ref 12.0–15.0)
Lymphocytes Relative: 20 %
Lymphs Abs: 1.4 10*3/uL (ref 0.7–4.0)
MCH: 30.9 pg (ref 26.0–34.0)
MCHC: 35.8 g/dL (ref 30.0–36.0)
MCV: 86.2 fL (ref 78.0–100.0)
Monocytes Absolute: 0.5 10*3/uL (ref 0.1–1.0)
Monocytes Relative: 7 %
Neutro Abs: 5.1 10*3/uL (ref 1.7–7.7)
Neutrophils Relative %: 71 %
Platelets: 184 10*3/uL (ref 150–400)
RBC: 4.21 MIL/uL (ref 3.87–5.11)
RDW: 13.4 % (ref 11.5–15.5)
WBC: 7.2 10*3/uL (ref 4.0–10.5)

## 2016-03-08 LAB — URINALYSIS, ROUTINE W REFLEX MICROSCOPIC
Bilirubin Urine: NEGATIVE
Glucose, UA: NEGATIVE mg/dL
Hgb urine dipstick: NEGATIVE
Ketones, ur: NEGATIVE mg/dL
Nitrite: NEGATIVE
Protein, ur: NEGATIVE mg/dL
Specific Gravity, Urine: 1.006 (ref 1.005–1.030)
pH: 7.5 (ref 5.0–8.0)

## 2016-03-08 LAB — I-STAT TROPONIN, ED: Troponin i, poc: 0 ng/mL (ref 0.00–0.08)

## 2016-03-08 MED ORDER — SODIUM CHLORIDE 0.9% FLUSH
3.0000 mL | Freq: Two times a day (BID) | INTRAVENOUS | Status: DC
  Administered 2016-03-08 – 2016-03-10 (×2): 3 mL via INTRAVENOUS

## 2016-03-08 MED ORDER — ACETAMINOPHEN 650 MG RE SUPP: 650.0000 mg | Freq: Four times a day (QID) | RECTAL | Status: DC | PRN

## 2016-03-08 MED ORDER — DEXTROSE-NACL 5-0.9 % IV SOLN
Freq: Once | INTRAVENOUS | Status: AC
  Administered 2016-03-08: 12:00:00 via INTRAVENOUS

## 2016-03-08 MED ORDER — ENOXAPARIN SODIUM 40 MG/0.4ML ~~LOC~~ SOLN
40.0000 mg | SUBCUTANEOUS | Status: DC
  Administered 2016-03-08 – 2016-03-09 (×2): 40 mg via SUBCUTANEOUS
  Filled 2016-03-08 (×2): qty 0.4

## 2016-03-08 MED ORDER — ACETAMINOPHEN 325 MG PO TABS
650.0000 mg | ORAL_TABLET | Freq: Four times a day (QID) | ORAL | Status: DC | PRN
  Administered 2016-03-09: 650 mg via ORAL
  Filled 2016-03-08: qty 2

## 2016-03-08 MED ORDER — ONDANSETRON HCL 4 MG PO TABS: 4.0000 mg | ORAL_TABLET | Freq: Four times a day (QID) | ORAL | Status: DC | PRN

## 2016-03-08 MED ORDER — LEVOTHYROXINE SODIUM 25 MCG PO TABS
137.0000 ug | ORAL_TABLET | Freq: Every day | ORAL | Status: DC
  Administered 2016-03-09: 137 ug via ORAL
  Filled 2016-03-08: qty 1

## 2016-03-08 MED ORDER — SODIUM CHLORIDE 0.9 % IV SOLN
INTRAVENOUS | Status: DC
  Administered 2016-03-08: 21:00:00 via INTRAVENOUS
  Filled 2016-03-08 (×2): qty 1000

## 2016-03-08 MED ORDER — METOPROLOL SUCCINATE ER 25 MG PO TB24
12.5000 mg | ORAL_TABLET | Freq: Every day | ORAL | Status: DC
  Administered 2016-03-09 – 2016-03-10 (×2): 12.5 mg via ORAL
  Filled 2016-03-08 (×2): qty 1

## 2016-03-08 MED ORDER — SODIUM CHLORIDE 0.9% FLUSH
3.0000 mL | Freq: Two times a day (BID) | INTRAVENOUS | Status: DC
  Administered 2016-03-10: 3 mL via INTRAVENOUS

## 2016-03-08 MED ORDER — ENOXAPARIN SODIUM 40 MG/0.4ML ~~LOC~~ SOLN: 40.0000 mg | SUBCUTANEOUS | Status: DC

## 2016-03-08 MED ORDER — SODIUM CHLORIDE 0.9 % IV BOLUS (SEPSIS)
1000.0000 mL | Freq: Once | INTRAVENOUS | Status: AC
  Administered 2016-03-08: 1000 mL via INTRAVENOUS

## 2016-03-08 MED ORDER — ONDANSETRON HCL 4 MG/2ML IJ SOLN
4.0000 mg | Freq: Four times a day (QID) | INTRAMUSCULAR | Status: DC | PRN
  Filled 2016-03-08: qty 2

## 2016-03-08 MED ORDER — ONDANSETRON HCL 4 MG/2ML IJ SOLN: 4.0000 mg | Freq: Four times a day (QID) | INTRAMUSCULAR | Status: DC | PRN

## 2016-03-08 MED ORDER — ACETAMINOPHEN 325 MG PO TABS: 650.0000 mg | ORAL_TABLET | Freq: Four times a day (QID) | ORAL | Status: DC | PRN

## 2016-03-08 NOTE — ED Provider Notes (Signed)
[facility]-EMERGENCY DEPT Provider Note   CSN: [id number] Arrival date & time: 03/08/16  1053     History   Chief Complaint Chief Complaint  Patient presents with  . Dizziness    HPI Heather Carey is a 65 y.o. female.  HPI   Pt with hx thyroid disease, TIA, mitral regurgitation, HTN, HLD, fibromyalgia p/w syncope and chest pain that began this morning.  Pt was seen in ED 03/06/16 with syncope that she states was similar.  Today she reports she suddenly felt cold all over, particularly her arms and legs, and she felt lightheaded.  She laid down on the couch and per home health aid she was "in and out" of consciousness and then finally passed out.  When EMS arrived she was cold and shaking.  She also noted pressure on the left side of her chest that radiates through to the back.  Denies SOB or cough.    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  She has lost 80 pounds over the year, eats only about 500 calories/day.  Foods she eats include flour, sugar, milk, butter, beans, broccoli, bacon, green beans, and pineapple.  She drinks water and coffee only.  Pt also notes she is very busy taking care of her 60 month old grandchild and her mother who has dementia so she sometimes forgets to eat.    ED visit on 11/26 pt had syncope and multiple neurologic complaints.  Workup was reassuring including MRI brain and MRA neck.    Past Medical History:  Diagnosis Date  . Diverticulitis   . Fibromyalgia   . GERD (gastroesophageal reflux disease)   . Hyperlipidemia   . Hypertension   . IBS (irritable bowel syndrome)   . Migraines   . Mitral regurgitation 02/28/2014   Mild to moderate by echo 02/2014  . Osteoarthritis   . Otitis externa   . Palpitations   . Palpitations   . Thyroid disease   . TIA (transient ischemic  attack)     Patient Active Problem List   Diagnosis Date Noted  . Urticaria with associated angioedema 04/27/2015  . Angioedema 04/27/2015  . Allergic reaction 01/07/2015  . Food allergy 01/07/2015  . Mitral regurgitation 02/28/2014  . MVP (mitral valve prolapse) 02/24/2014  . SOB (shortness of breath) 02/24/2014  . Snoring 02/24/2014  . Hypertension   . Hyperlipidemia   . Chest pain 02/10/2014    Past Surgical History:  Procedure Laterality Date  . ABDOMINAL HYSTERECTOMY    . carpel tunnel    . lbp gso    . TONSILLECTOMY AND ADENOIDECTOMY      OB History    No data available       Home Medications    Prior to Admission medications   Medication Sig Start Date End Date Taking? Authorizing Provider  diphenhydrAMINE (BENADRYL) 25 mg capsule Take 25 mg by mouth every 6 (six) hours as needed for allergies.   Yes Historical Provider, MD  EPINEPHrine 0.3 mg/0.3 mL IJ SOAJ injection Inject 0.3 mLs (0.3 mg total) into the muscle once. 04/27/15  Yes Cristal Ford, MD  levothyroxine (SYNTHROID, LEVOTHROID) 137 MCG tablet Take 137 mcg by mouth daily before breakfast.   Yes Historical Provider, MD  metoprolol succinate (TOPROL-XL) 25 MG 24 hr tablet Take 12.5 mg by mouth daily.   Yes  Historical Provider, MD  pantoprazole (PROTONIX) 40 MG tablet Take 1 tablet (40 mg total) by mouth daily at 6 (six) AM. Patient not taking: Reported on 03/08/2016 02/11/14   Kathlen ModyVijaya Akula, MD  predniSONE (DELTASONE) 20 MG tablet Take 2 tablets (40 mg total) by mouth daily. Patient not taking: Reported on 03/08/2016 08/28/15   Arthor CaptainAbigail Harris, PA-C    Family History Family History  Problem Relation Age of Onset  . Allergic rhinitis Mother   . Urticaria Mother   . Allergic rhinitis Father   . Asthma Father   . Allergic rhinitis Sister   . Angioedema Sister   . Urticaria Sister   . Allergic rhinitis Brother   . Asthma Brother     Social History Social History  Substance Use Topics  .  Smoking status: Never Smoker  . Smokeless tobacco: Never Used  . Alcohol use 0.6 oz/week    1 Glasses of wine per week     Allergies   Azithromycin; Codeine; Eggs or egg-derived products; Ivp dye [iodinated diagnostic agents]; Rose; Shellfish allergy; Thimerosol [thimerosal]; Strawberry extract; Avelox [moxifloxacin hcl in nacl]; Bacitracin; Benzalkonium chloride; Cinnamon; Ginger; Onion; Other; Sunscreens; Watermelon [citrullus vulgaris]; Cephalosporins; and Penicillins   Review of Systems Review of Systems  All other systems reviewed and are negative.    Physical Exam Updated Vital Signs BP 119/81   Pulse 75   Temp 98.1 F (36.7 C) (Oral)   Resp 15   SpO2 99%   Physical Exam  Constitutional: She appears well-developed and well-nourished. No distress.  HENT:  Head: Normocephalic and atraumatic.  Neck: Neck supple.  Cardiovascular: Normal rate and regular rhythm.   Pulmonary/Chest: Effort normal and breath sounds normal. No respiratory distress. She has no wheezes. She has no rales.  Abdominal: Soft. She exhibits no distension. There is no tenderness. There is no rebound and no guarding.  Neurological: She is alert.  Skin: She is not diaphoretic.  Nursing note and vitals reviewed.    ED Treatments / Results  Labs (all labs ordered are listed, but only abnormal results are displayed) Labs Reviewed  URINALYSIS, ROUTINE W REFLEX MICROSCOPIC (NOT AT [facility]) - Abnormal; Notable for the following:       Result Value   Leukocytes, UA TRACE (*)    All other components within normal limits  COMPREHENSIVE METABOLIC PANEL - Abnormal; Notable for the following:    Glucose, Bld 104 (*)    Creatinine, Ser 1.02 (*)    Total Protein 6.2 (*)    GFR calc non Af Amer 57 (*)    All other components within normal limits  URINE MICROSCOPIC-ADD ON - Abnormal; Notable for the following:    Squamous Epithelial / LPF 0-5 (*)    All other components within normal limits  CBC WITH  DIFFERENTIAL/PLATELET  D-DIMER, QUANTITATIVE (NOT AT [facility])  Rosezena SensorI-STAT TROPOININ, ED    EKG  EKG Interpretation None       Radiology Dg Chest 2 View  Result Date: 03/08/2016 CLINICAL DATA:  Mid chest pain weakness, and near syncope. History of palpitations, TIA, and hypertension. Nonsmoker. EXAM: CHEST  2 VIEW COMPARISON:  Chest x-ray of July 17, 2015. FINDINGS: The lungs are well-expanded. There is stable scarring in the lingula. There is no alveolar infiltrate. There is no pleural effusion. The heart and pulmonary vascularity are normal. There is calcification in the wall of the aortic arch. There is multilevel degenerative disc disease of the thoracic spine which is stable. IMPRESSION: Mild chronic bronchitic  changes. No acute cardiopulmonary abnormality. Electronically Signed   By: David  [place] M.D.   On: 03/08/2016 13:26   Mr Angiogram Head Wo Contrast  Result Date: 03/06/2016 CLINICAL DATA:  Syncopal episode today lunch. Headache for 1 week. Vertigo after standing. Right-sided numbness. EXAM: MRI HEAD WITHOUT CONTRAST MRA HEAD WITHOUT CONTRAST TECHNIQUE: Multiplanar, multiecho pulse sequences of the brain and surrounding structures were obtained without intravenous contrast. Angiographic images of the head were obtained using MRA technique without contrast. COMPARISON:  CT head without contrast the same day. MRI brain 02/17/2011 FINDINGS: MRI HEAD FINDINGS Brain: Diffusion-weighted images demonstrate no evidence for acute or subacute infarction. No acute hemorrhage or mass lesion is present. The ventricles are of normal size. No significant extra-axial fluid collection is present. Scattered subcortical T2 hyperintensities are mildly advanced for age. The internal auditory canals are within normal limits bilaterally. The brainstem and cerebellum are normal. Enlarged empty sella is stable. Vascular: Flow is present in the major intracranial arteries. Skull and upper cervical spine: The  skullbase is normal. Midline sagittal structures are otherwise within normal limits. The upper cervical spine is normal. 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 Sinuses/Orbits: The paranasal sinuses are clear. There is some fluid in left mastoid air cells. The right mastoid air cells are clear. MRA HEAD FINDINGS Internal carotid arteries are within normal limits from the high cervical segments through the ICA termini bilaterally. The A1 and M1 segments are normal. The anterior communicating artery is patent. The MCA bifurcations are intact. ACA and MCA branch vessels are normal. The right vertebral artery is slightly dominant left. The right PICA origin is visualized and normal. The left PICA origin is not visualized. The basilar artery is normal. Both posterior cerebral arteries originate from the basilar tip. PCA branch vessels are within normal limits. IMPRESSION: 1. Slight progression of scattered subcortical T2 hyperintensities bilaterally, mildly advanced for age. The finding is nonspecific but can be seen in the setting of chronic microvascular ischemia, a demyelinating process such as multiple sclerosis, vasculitis, complicated migraine headaches, or as the sequelae of a prior infectious or inflammatory process. 2. Chronic left mastoid effusion. No obstructing nasopharyngeal lesion is present. 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 3. No acute intracranial abnormality. 4. Normal variant MRA circle of Willis without significant proximal stenosis, aneurysm, or branch vessel occlusion. Electronically Signed   By: Marin Roberts M.D.   On: 03/06/2016 21:19   Mr Angiogram Neck W Or Wo Contrast  Result Date: 03/06/2016 CLINICAL DATA:  65 y/o F; syncopal episode with neck pain and right-sided numbness. EXAM: MRA NECK WITHOUT AND WITH CONTRAST TECHNIQUE: Multiplanar and multiecho pulse sequences of the neck  were obtained without and with intravenous contrast. Angiographic images of the neck were obtained using MRA technique without and with intravenous contrast. CONTRAST:  15mL MULTIHANCE GADOBENATE DIMEGLUMINE 529 MG/ML IV SOLN COMPARISON:  None. FINDINGS: Aortic arch: No occlusion, aneurysm, dissection, or significant stenosis is identified. Standard arch anatomy. Right common carotid artery: No occlusion, aneurysm, dissection, or significant stenosis is identified. Right internal carotid artery: No occlusion, aneurysm, dissection, or significant stenosis is identified. Right vertebral artery: No occlusion, aneurysm, dissection, or significant stenosis is identified. Left common carotid artery: No occlusion, aneurysm, dissection, or significant stenosis is identified. Left Internal carotid artery: No occlusion, aneurysm, dissection, or significant stenosis is identified. Left Vertebral artery: No occlusion, aneurysm, dissection, or significant stenosis is  identified. IMPRESSION: Carotid and vertebral arteries of the neck are patent without evidence for occlusion, aneurysm, dissection, or hemodynamically significant stenosis. Electronically Signed   By: Mitzi Hansen M.D.   On: 03/06/2016 23:59   Mr Brain Wo Contrast  Result Date: 03/06/2016 CLINICAL DATA:  Syncopal episode today lunch. Headache for 1 week. Vertigo after standing. Right-sided numbness. EXAM: MRI HEAD WITHOUT CONTRAST MRA HEAD WITHOUT CONTRAST TECHNIQUE: Multiplanar, multiecho pulse sequences of the brain and surrounding structures were obtained without intravenous contrast. Angiographic images of the head were obtained using MRA technique without contrast. COMPARISON:  CT head without contrast the same day. MRI brain 02/17/2011 FINDINGS: MRI HEAD FINDINGS Brain: Diffusion-weighted images demonstrate no evidence for acute or subacute infarction. No acute hemorrhage or mass lesion is present. The ventricles are of normal size. No  significant extra-axial fluid collection is present. Scattered subcortical T2 hyperintensities are mildly advanced for age. The internal auditory canals are within normal limits bilaterally. The brainstem and cerebellum are normal. Enlarged empty sella is stable. Vascular: Flow is present in the major intracranial arteries. Skull and upper cervical spine: The skullbase is normal. Midline sagittal structures are otherwise within normal limits. The upper cervical spine is normal. 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 Sinuses/Orbits: The paranasal sinuses are clear. There is some fluid in left mastoid air cells. The right mastoid air cells are clear. MRA HEAD FINDINGS Internal carotid arteries are within normal limits from the high cervical segments through the ICA termini bilaterally. The A1 and M1 segments are normal. The anterior communicating artery is patent. The MCA bifurcations are intact. ACA and MCA branch vessels are normal. The right vertebral artery is slightly dominant left. The right PICA origin is visualized and normal. The left PICA origin is not visualized. The basilar artery is normal. Both posterior cerebral arteries originate from the basilar tip. PCA branch vessels are within normal limits. IMPRESSION: 1. Slight progression of scattered subcortical T2 hyperintensities bilaterally, mildly advanced for age. The finding is nonspecific but can be seen in the setting of chronic microvascular ischemia, a demyelinating process such as multiple sclerosis, vasculitis, complicated migraine headaches, or as the sequelae of a prior infectious or inflammatory process. 2. Chronic left mastoid effusion. No obstructing nasopharyngeal lesion is present. 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 3. No acute intracranial abnormality. 4. Normal variant MRA circle of Willis without significant proximal stenosis, aneurysm,  or branch vessel occlusion. Electronically Signed   By: Marin Roberts M.D.   On: 03/06/2016 21:19    Procedures Procedures (including critical care time)  Medications Ordered in ED Medications  sodium chloride 0.9 % bolus 1,000 mL (not administered)  dextrose 5 %-0.9 % sodium chloride infusion ( Intravenous New Bag/Given 03/08/16 1224)     Initial Impression / Assessment and Plan / ED Course  I have reviewed the triage vital signs and the nursing notes.  Pertinent labs & imaging results that were available during my care of the patient were reviewed by me and considered in my medical decision making (see chart for details).  Clinical Course as of Mar 09 1727  Tue Mar 08, 2016  1655 I spoke with Dr Arbutus Leas, [facility], who will see the patient.    [EW]    Clinical Course User Index [EW] Trixie Dredge, PA-C    Afebrile nontoxic patient with 2 episodes of syncope over three days.  Extensive workup  two days ago reassuring, also today.  However, pt has very low calorie intake and does have increase in heartrate with standing during orthostatic testing.  Likely dehydrated and also suspect malnutrition given very limited variety of foods she eats.  Admitted to [facility] for overnight observation on telemetry.    Final Clinical Impressions(s) / ED Diagnoses   Final diagnoses:  Syncope, unspecified syncope type    New Prescriptions New Prescriptions   No medications on file     Trixie Dredgemily Kerney Hopfensperger, PA-C 03/08/16 1728    Marily MemosJason Mesner, MD 03/11/16 1546

## 2016-03-08 NOTE — H&P (Signed)
History and Physical  Heather Carey ZOX:[id number] DOB: October 22, 1950 DOA: 03/08/2016   PCP: [name], MD    Patient coming from: Home  Chief Complaint: syncope  HPI:  Heather Carey is a 65 y.o. female with medical history of eosinophilic esophagitis, hypertension, fibromyalgia presented with syncope over the past 3 days. The patient was initially seen in the emergency department on 03/06/2016. The patient had MRI of the brain, MRA of the neck and brain which were all unremarkable. She was discharged in stable condition. She stated that she had a syncopal episode earlier on 03/06/2016 after standing up from a seated position. This was preceded with a prodrome of her hands and legs feeling cold. When she woke up, she felt like her legs were like "jelly". She did not bite her tongue nor was her bowel or bladder incontinence. On the morning of 03/08/2016, the patient was walking into the [facility] when she again felt some lightheadedness and a cold sensation in her hands and legs, and the next day she remembered was laying on the floor. The patient states that she has been on a very restrictive diet for the last 2 years secondary to her in a eosinophilic esophagitis.  She states that she has lost approximately 80 pounds in the past year secondary to her poor oral intake and restrictive diet. She denies any exertional chest discomfort or shortness of breath. She denies any fevers, chills, nausea, vomiting, diarrhea, dysuria, hematuria.  In the emergency department, the patient was afebrile and hemodynamically stable and saturating well on room air. BMP and CBC were essentially unremarkable. Chest x-ray showed mild bronchitic changes. LFTs were unremarkable. Urinalysis was negative for pyuria. EKG showed sinus rhythm without any ST or T-wave changes. The patient was given 1 L normal saline and observation admission was requested.  Assessment/Plan: Syncope -Secondary to volume depletion and vagal  reaction -Orthostatic vital signs -Echocardiogram -02/29/2016 echo EF 55-60%, mild to moderate MR and TR -EEG although will likely be low -Continue IV fluids -Urine drug screen  Sensory disturbance -TSH -Serum B12 -Magnesium -Urine drug screen -03/06/2016 MRI brain negative for acute findings  Hypothyroidism -Continue levothyroxine -TSH  Hypertension -Well-controlled -Continue metoprolol succinate        Past Medical History:  Diagnosis Date  . Diverticulitis   . Fibromyalgia   . GERD (gastroesophageal reflux disease)   . Hyperlipidemia   . Hypertension   . IBS (irritable bowel syndrome)   . Migraines   . Mitral regurgitation 02/28/2014   Mild to moderate by echo 02/2014  . Osteoarthritis   . Otitis externa   . Palpitations   . Palpitations   . Thyroid disease   . TIA (transient ischemic attack)    Past Surgical History:  Procedure Laterality Date  . ABDOMINAL HYSTERECTOMY    . carpel tunnel    . lbp gso    . TONSILLECTOMY AND ADENOIDECTOMY     Social History:  reports that she has never smoked. She has never used smokeless tobacco. She reports that she drinks about 0.6 oz of alcohol per week . She reports that she does not use drugs.   Family History  Problem Relation Age of Onset  . Allergic rhinitis Mother   . Urticaria Mother   . Allergic rhinitis Father   . Asthma Father   . Allergic rhinitis Sister   . Angioedema Sister   . Urticaria Sister   . Allergic rhinitis Brother   . Asthma Brother  Allergies  Allergen Reactions  . Azithromycin Anaphylaxis  . Codeine Shortness Of Breath  . Eggs Or Egg-Derived Products     ITCHING AND SWELLING OF LIPS  . Ivp Dye [Iodinated Diagnostic Agents] Anaphylaxis  . Rose Hives  . Shellfish Allergy Anaphylaxis  . Thimerosol [Thimerosal] Anaphylaxis  . Strawberry Extract Swelling    Pt reports throat swelling and itching with any type of berry  . Avelox [Moxifloxacin Hcl In Nacl]     Irregular  heart beat  . Bacitracin Itching and Other (See Comments)    Blisters on skin   . Benzalkonium Chloride Other (See Comments)  . Cinnamon Swelling  . Ginger Swelling  . Onion     MOUTH SWELLING  . Other Swelling    Nuts and ginger    . Sunscreens Swelling  . Watermelon [Citrullus Vulgaris]     ITCHY MOUTH, LIP SWELLING  . Cephalosporins Rash  . Penicillins Hives     Prior to Admission medications   Medication Sig Start Date End Date Taking? Authorizing Provider  diphenhydrAMINE (BENADRYL) 25 mg capsule Take 25 mg by mouth every 6 (six) hours as needed for allergies.   Yes Historical Provider, MD  EPINEPHrine 0.3 mg/0.3 mL IJ SOAJ injection Inject 0.3 mLs (0.3 mg total) into the muscle once. 04/27/15  Yes Cristal Ford, MD  levothyroxine (SYNTHROID, LEVOTHROID) 137 MCG tablet Take 137 mcg by mouth daily before breakfast.   Yes Historical Provider, MD  metoprolol succinate (TOPROL-XL) 25 MG 24 hr tablet Take 12.5 mg by mouth daily.   Yes Historical Provider, MD  pantoprazole (PROTONIX) 40 MG tablet Take 1 tablet (40 mg total) by mouth daily at 6 (six) AM. Patient not taking: Reported on 03/08/2016 02/11/14   Kathlen Mody, MD  predniSONE (DELTASONE) 20 MG tablet Take 2 tablets (40 mg total) by mouth daily. Patient not taking: Reported on 03/08/2016 08/28/15   Arthor Captain, PA-C    Review of Systems:  Constitutional:  No weight loss, night sweats, Fevers, chills, fatigue.  Head&Eyes: No headache.  No vision loss.  No eye pain or scotoma ENT:  No Difficulty swallowing,Tooth/dental problems,Sore throat,  No ear ache, post nasal drip,  Cardio-vascular:  No chest pain, Orthopnea, PND, swelling in lower extremities,  dizziness, palpitations  GI:  No  abdominal pain, nausea, vomiting, diarrhea, loss of appetite, hematochezia, melena, heartburn, indigestion, Resp:  No shortness of breath with exertion or at rest. No cough. No coughing up of blood .No wheezing.No chest wall  deformity  Skin:  no rash or lesions.  GU:  no dysuria, change in color of urine, no urgency or frequency. No flank pain.  Musculoskeletal:  No joint pain or swelling. No decreased range of motion. No back pain.  Psych:  No change in mood or affect.  Neurologic: No headache, no focal weakness, no vision loss. No syncope  Physical Exam: Vitals:   03/08/16 1615 03/08/16 1630 03/08/16 1645 03/08/16 1700  BP: 120/77 124/78 122/77 119/81  Pulse: 70 70 78 75  Resp: 12 10 11 15   Temp:      TempSrc:      SpO2: 100% 100% 98% 99%   General:  A&O x 3, NAD, nontoxic, pleasant/cooperative Head/Eye: No conjunctival hemorrhage, no icterus, [place]/AT, No nystagmus ENT:  No icterus,  No thrush, good dentition, no pharyngeal exudate Neck:  No masses, no lymphadenpathy, no bruits CV:  RRR, no rub, no gallop, no S3 Lung:  CTAB, good air movement, no wheeze, no rhonchi  Abdomen: soft/NT, +BS, nondistended, no peritoneal signs Ext: No cyanosis, No rashes, No petechiae, No lymphangitis, No edema Neuro: CNII-XII intact, strength 4/5 in bilateral upper and lower extremities, no dysmetria  Labs on Admission:  Basic Metabolic Panel:  Recent Labs Lab 03/06/16 1352 03/08/16 1153  NA 133* 142  K 3.5 3.5  CL 101 110  CO2 24 24  GLUCOSE 107* 104*  BUN 8 7  CREATININE 0.71 1.02*  CALCIUM 8.7* 9.1   Liver Function Tests:  Recent Labs Lab 03/08/16 1153  AST 18  ALT 14  ALKPHOS 45  BILITOT 0.6  PROT 6.2*  ALBUMIN 3.9   No results for input(s): LIPASE, AMYLASE in the last 168 hours. No results for input(s): AMMONIA in the last 168 hours. CBC:  Recent Labs Lab 03/06/16 1352 03/08/16 1153  WBC 5.9 7.2  NEUTROABS  --  5.1  HGB 12.9 13.0  HCT 37.5 36.3  MCV 87.4 86.2  PLT 198 184   Coagulation Profile: No results for input(s): INR, PROTIME in the last 168 hours. Cardiac Enzymes: No results for input(s): CKTOTAL, CKMB, CKMBINDEX, TROPONINI in the last 168 hours. BNP: Invalid  input(s): POCBNP CBG: No results for input(s): GLUCAP in the last 168 hours. Urine analysis:    Component Value Date/Time   COLORURINE YELLOW 03/08/2016 1121   APPEARANCEUR CLEAR 03/08/2016 1121   LABSPEC 1.006 03/08/2016 1121   PHURINE 7.5 03/08/2016 1121   GLUCOSEU NEGATIVE 03/08/2016 1121   HGBUR NEGATIVE 03/08/2016 1121   BILIRUBINUR NEGATIVE 03/08/2016 1121   KETONESUR NEGATIVE 03/08/2016 1121   PROTEINUR NEGATIVE 03/08/2016 1121   UROBILINOGEN 0.2 02/16/2011 1656   NITRITE NEGATIVE 03/08/2016 1121   LEUKOCYTESUR TRACE (A) 03/08/2016 1121   Sepsis Labs: @LABRCNTIP (procalcitonin:4,lacticidven:4) )No results found for this or any previous visit (from the past 240 hour(s)).   Radiological Exams on Admission: Dg Chest 2 View  Result Date: 03/08/2016 CLINICAL DATA:  Mid chest pain weakness, and near syncope. History of palpitations, TIA, and hypertension. Nonsmoker. EXAM: CHEST  2 VIEW COMPARISON:  Chest x-ray of July 17, 2015. FINDINGS: The lungs are well-expanded. There is stable scarring in the lingula. There is no alveolar infiltrate. There is no pleural effusion. The heart and pulmonary vascularity are normal. There is calcification in the wall of the aortic arch. There is multilevel degenerative disc disease of the thoracic spine which is stable. IMPRESSION: Mild chronic bronchitic changes. No acute cardiopulmonary abnormality. Electronically Signed   By: Videl Nobrega  [place] M.D.   On: 03/08/2016 13:26   Mr Angiogram Head Wo Contrast  Result Date: 03/06/2016 CLINICAL DATA:  Syncopal episode today lunch. Headache for 1 week. Vertigo after standing. Right-sided numbness. EXAM: MRI HEAD WITHOUT CONTRAST MRA HEAD WITHOUT CONTRAST TECHNIQUE: Multiplanar, multiecho pulse sequences of the brain and surrounding structures were obtained without intravenous contrast. Angiographic images of the head were obtained using MRA technique without contrast. COMPARISON:  CT head without contrast the  same day. MRI brain 02/17/2011 FINDINGS: MRI HEAD FINDINGS Brain: Diffusion-weighted images demonstrate no evidence for acute or subacute infarction. No acute hemorrhage or mass lesion is present. The ventricles are of normal size. No significant extra-axial fluid collection is present. Scattered subcortical T2 hyperintensities are mildly advanced for age. The internal auditory canals are within normal limits bilaterally. The brainstem and cerebellum are normal. Enlarged empty sella is stable. Vascular: Flow is present in the major intracranial arteries. Skull and upper cervical spine: The skullbase is normal. Midline sagittal structures are otherwise within normal limits. The  upper cervical spine is normal. 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 Sinuses/Orbits: The paranasal sinuses are clear. There is some fluid in left mastoid air cells. The right mastoid air cells are clear. MRA HEAD FINDINGS Internal carotid arteries are within normal limits from the high cervical segments through the ICA termini bilaterally. The A1 and M1 segments are normal. The anterior communicating artery is patent. The MCA bifurcations are intact. ACA and MCA branch vessels are normal. The right vertebral artery is slightly dominant left. The right PICA origin is visualized and normal. The left PICA origin is not visualized. The basilar artery is normal. Both posterior cerebral arteries originate from the basilar tip. PCA branch vessels are within normal limits. IMPRESSION: 1. Slight progression of scattered subcortical T2 hyperintensities bilaterally, mildly advanced for age. The finding is nonspecific but can be seen in the setting of chronic microvascular ischemia, a demyelinating process such as multiple sclerosis, vasculitis, complicated migraine headaches, or as the sequelae of a prior infectious or inflammatory process. 2. Chronic left mastoid effusion.  No obstructing nasopharyngeal lesion is present. 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 3. No acute intracranial abnormality. 4. Normal variant MRA circle of Willis without significant proximal stenosis, aneurysm, or branch vessel occlusion. Electronically Signed   By: Marin Robertshristopher  Mattern M.D.   On: 03/06/2016 21:19   Mr Angiogram Neck W Or Wo Contrast  Result Date: 03/06/2016 CLINICAL DATA:  65 y/o F; syncopal episode with neck pain and right-sided numbness. EXAM: MRA NECK WITHOUT AND WITH CONTRAST TECHNIQUE: Multiplanar and multiecho pulse sequences of the neck were obtained without and with intravenous contrast. Angiographic images of the neck were obtained using MRA technique without and with intravenous contrast. CONTRAST:  15mL MULTIHANCE GADOBENATE DIMEGLUMINE 529 MG/ML IV SOLN COMPARISON:  None. FINDINGS: Aortic arch: No occlusion, aneurysm, dissection, or significant stenosis is identified. Standard arch anatomy. Right common carotid artery: No occlusion, aneurysm, dissection, or significant stenosis is identified. Right internal carotid artery: No occlusion, aneurysm, dissection, or significant stenosis is identified. Right vertebral artery: No occlusion, aneurysm, dissection, or significant stenosis is identified. Left common carotid artery: No occlusion, aneurysm, dissection, or significant stenosis is identified. Left Internal carotid artery: No occlusion, aneurysm, dissection, or significant stenosis is identified. Left Vertebral artery: No occlusion, aneurysm, dissection, or significant stenosis is identified. IMPRESSION: Carotid and vertebral arteries of the neck are patent without evidence for occlusion, aneurysm, dissection, or hemodynamically significant stenosis. Electronically Signed   By: Mitzi HansenLance  Furusawa-Stratton M.D.   On: 03/06/2016 23:59   Mr Brain Wo Contrast  Result Date: 03/06/2016 CLINICAL DATA:  Syncopal episode today lunch. Headache for 1 week. Vertigo  after standing. Right-sided numbness. EXAM: MRI HEAD WITHOUT CONTRAST MRA HEAD WITHOUT CONTRAST TECHNIQUE: Multiplanar, multiecho pulse sequences of the brain and surrounding structures were obtained without intravenous contrast. Angiographic images of the head were obtained using MRA technique without contrast. COMPARISON:  CT head without contrast the same day. MRI brain 02/17/2011 FINDINGS: MRI HEAD FINDINGS Brain: Diffusion-weighted images demonstrate no evidence for acute or subacute infarction. No acute hemorrhage or mass lesion is present. The ventricles are of normal size. No significant extra-axial fluid collection is present. Scattered subcortical T2 hyperintensities are mildly advanced for age. The internal auditory canals are within normal limits bilaterally. The brainstem and cerebellum are normal. Enlarged empty sella is stable. Vascular: Flow is present in the major intracranial arteries. Skull and upper  cervical spine: The skullbase is normal. Midline sagittal structures are otherwise within normal limits. The upper cervical spine is normal. 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 Sinuses/Orbits: The paranasal sinuses are clear. There is some fluid in left mastoid air cells. The right mastoid air cells are clear. MRA HEAD FINDINGS Internal carotid arteries are within normal limits from the high cervical segments through the ICA termini bilaterally. The A1 and M1 segments are normal. The anterior communicating artery is patent. The MCA bifurcations are intact. ACA and MCA branch vessels are normal. The right vertebral artery is slightly dominant left. The right PICA origin is visualized and normal. The left PICA origin is not visualized. The basilar artery is normal. Both posterior cerebral arteries originate from the basilar tip. PCA branch vessels are within normal limits. IMPRESSION: 1. Slight progression of scattered  subcortical T2 hyperintensities bilaterally, mildly advanced for age. The finding is nonspecific but can be seen in the setting of chronic microvascular ischemia, a demyelinating process such as multiple sclerosis, vasculitis, complicated migraine headaches, or as the sequelae of a prior infectious or inflammatory process. 2. Chronic left mastoid effusion. No obstructing nasopharyngeal lesion is present. 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 3. No acute intracranial abnormality. 4. Normal variant MRA circle of Willis without significant proximal stenosis, aneurysm, or branch vessel occlusion. Electronically Signed   By: Marin Robertshristopher  Mattern M.D.   On: 03/06/2016 21:19    EKG: Independently reviewed. Sinus rhythm, no ST-T wave changes    Time spent:60 minutes Code Status:   FULL Family Communication:  Daughter updated at bedside Disposition Plan: expect 1 day hospitalization Consults called: none DVT Prophylaxis:  Lovenox  Lonisha Bobby, DO  [facility] Pager [phone]  If 7PM-7AM, please contact night-coverage [email or web address] Password [email or web address] 03/08/2016, 5:35 PM

## 2016-03-08 NOTE — ED Triage Notes (Signed)
Pt was seen here Sunday here for same complaint. Given fluid and was sent home for dehydration. States she is still feeling dizzy and like she is going to pass out. A&Ox3; complaining of being cold.

## 2016-03-08 NOTE — ED Notes (Signed)
Pt ambulated to bathroom with one assist was steady on her feet

## 2016-03-08 NOTE — Progress Notes (Signed)
Patient arrived in the unit accompanied by NT via stretcher. Orientation to the unit given. Patient verbalizes understanding. 

## 2016-03-08 NOTE — ED Notes (Signed)
Placed patient into a gown and on the monitor 

## 2016-03-09 ENCOUNTER — Observation Stay (HOSPITAL_COMMUNITY): Payer: PRIVATE HEALTH INSURANCE

## 2016-03-09 ENCOUNTER — Encounter (HOSPITAL_COMMUNITY): Payer: Self-pay | Admitting: Rehabilitation

## 2016-03-09 DIAGNOSIS — K200 Eosinophilic esophagitis: Secondary | ICD-10-CM | POA: Diagnosis present

## 2016-03-09 DIAGNOSIS — Z825 Family history of asthma and other chronic lower respiratory diseases: Secondary | ICD-10-CM | POA: Diagnosis not present

## 2016-03-09 DIAGNOSIS — R079 Chest pain, unspecified: Secondary | ICD-10-CM | POA: Diagnosis present

## 2016-03-09 DIAGNOSIS — K219 Gastro-esophageal reflux disease without esophagitis: Secondary | ICD-10-CM | POA: Diagnosis present

## 2016-03-09 DIAGNOSIS — R55 Syncope and collapse: Secondary | ICD-10-CM | POA: Diagnosis present

## 2016-03-09 DIAGNOSIS — Z79899 Other long term (current) drug therapy: Secondary | ICD-10-CM | POA: Diagnosis not present

## 2016-03-09 DIAGNOSIS — I10 Essential (primary) hypertension: Secondary | ICD-10-CM

## 2016-03-09 DIAGNOSIS — E869 Volume depletion, unspecified: Secondary | ICD-10-CM | POA: Diagnosis present

## 2016-03-09 DIAGNOSIS — Z7952 Long term (current) use of systemic steroids: Secondary | ICD-10-CM | POA: Diagnosis not present

## 2016-03-09 DIAGNOSIS — E440 Moderate protein-calorie malnutrition: Secondary | ICD-10-CM | POA: Diagnosis present

## 2016-03-09 DIAGNOSIS — R1312 Dysphagia, oropharyngeal phase: Secondary | ICD-10-CM | POA: Diagnosis present

## 2016-03-09 DIAGNOSIS — E038 Other specified hypothyroidism: Secondary | ICD-10-CM | POA: Diagnosis not present

## 2016-03-09 DIAGNOSIS — L509 Urticaria, unspecified: Secondary | ICD-10-CM | POA: Diagnosis present

## 2016-03-09 DIAGNOSIS — R209 Unspecified disturbances of skin sensation: Secondary | ICD-10-CM

## 2016-03-09 DIAGNOSIS — I340 Nonrheumatic mitral (valve) insufficiency: Secondary | ICD-10-CM | POA: Diagnosis present

## 2016-03-09 DIAGNOSIS — E538 Deficiency of other specified B group vitamins: Secondary | ICD-10-CM | POA: Diagnosis present

## 2016-03-09 DIAGNOSIS — M797 Fibromyalgia: Secondary | ICD-10-CM | POA: Diagnosis present

## 2016-03-09 DIAGNOSIS — E039 Hypothyroidism, unspecified: Secondary | ICD-10-CM | POA: Diagnosis present

## 2016-03-09 DIAGNOSIS — Z8673 Personal history of transient ischemic attack (TIA), and cerebral infarction without residual deficits: Secondary | ICD-10-CM | POA: Diagnosis not present

## 2016-03-09 DIAGNOSIS — E785 Hyperlipidemia, unspecified: Secondary | ICD-10-CM | POA: Diagnosis present

## 2016-03-09 LAB — RAPID URINE DRUG SCREEN, HOSP PERFORMED
Amphetamines: NOT DETECTED
Barbiturates: NOT DETECTED
Benzodiazepines: NOT DETECTED
Cocaine: NOT DETECTED
Opiates: POSITIVE — AB
Tetrahydrocannabinol: NOT DETECTED

## 2016-03-09 LAB — TSH: TSH: 14.078 u[IU]/mL — ABNORMAL HIGH (ref 0.350–4.500)

## 2016-03-09 LAB — BASIC METABOLIC PANEL
Anion gap: 7 (ref 5–15)
BUN: 7 mg/dL (ref 6–20)
CO2: 24 mmol/L (ref 22–32)
Calcium: 8.7 mg/dL — ABNORMAL LOW (ref 8.9–10.3)
Chloride: 113 mmol/L — ABNORMAL HIGH (ref 101–111)
Creatinine, Ser: 0.75 mg/dL (ref 0.44–1.00)
GFR calc Af Amer: >60 mL/min (ref >60)
GFR calc non Af Amer: >60 mL/min (ref >60)
Glucose, Bld: 98 mg/dL (ref 65–99)
Potassium: 3.7 mmol/L (ref 3.5–5.1)
Sodium: 144 mmol/L (ref 135–145)

## 2016-03-09 LAB — MAGNESIUM: Magnesium: 2.1 mg/dL (ref 1.7–2.4)

## 2016-03-09 LAB — VITAMIN B12: Vitamin B-12: 130 pg/mL — ABNORMAL LOW (ref 180–914)

## 2016-03-09 LAB — GLUCOSE, CAPILLARY: Glucose-Capillary: 85 mg/dL (ref 65–99)

## 2016-03-09 LAB — CORTISOL: Cortisol, Plasma: 5.9 ug/dL

## 2016-03-09 MED ORDER — ONDANSETRON HCL 4 MG/2ML IJ SOLN
INTRAMUSCULAR | Status: AC
  Filled 2016-03-09: qty 2

## 2016-03-09 MED ORDER — COSYNTROPIN 0.25 MG IJ SOLR
0.2500 mg | Freq: Once | INTRAMUSCULAR | Status: DC
  Filled 2016-03-09: qty 0.25

## 2016-03-09 MED ORDER — DIPHENHYDRAMINE-ZINC ACETATE 2-0.1 % EX CREA
TOPICAL_CREAM | Freq: Three times a day (TID) | CUTANEOUS | Status: DC | PRN
  Administered 2016-03-09: 18:00:00 via TOPICAL
  Filled 2016-03-09: qty 28

## 2016-03-09 MED ORDER — VITAMIN B-12 1000 MCG PO TABS
1000.0000 ug | ORAL_TABLET | Freq: Every day | ORAL | Status: DC
  Administered 2016-03-09 – 2016-03-10 (×2): 1000 ug via ORAL
  Filled 2016-03-09 (×2): qty 1

## 2016-03-09 MED ORDER — ADULT MULTIVITAMIN W/MINERALS CH
1.0000 | ORAL_TABLET | Freq: Every day | ORAL | Status: DC
  Administered 2016-03-09 – 2016-03-10 (×2): 1 via ORAL
  Filled 2016-03-09 (×2): qty 1

## 2016-03-09 MED ORDER — LEVOTHYROXINE SODIUM 75 MCG PO TABS
150.0000 ug | ORAL_TABLET | Freq: Every day | ORAL | Status: DC
  Administered 2016-03-10: 150 ug via ORAL
  Filled 2016-03-09: qty 2

## 2016-03-09 MED ORDER — POTASSIUM CHLORIDE IN NACL 20-0.9 MEQ/L-% IV SOLN
INTRAVENOUS | Status: DC
  Administered 2016-03-09: 11:00:00 via INTRAVENOUS
  Administered 2016-03-09: 1 mL via INTRAVENOUS
  Filled 2016-03-09 (×3): qty 1000

## 2016-03-09 MED ORDER — ENSURE ENLIVE PO LIQD: 237.0000 mL | Freq: Two times a day (BID) | ORAL | Status: DC

## 2016-03-09 NOTE — Progress Notes (Signed)
EEG called for patient.

## 2016-03-09 NOTE — Progress Notes (Signed)
Pt notified of upcoming study, advised to remain NPO until further notice/completion of this exam/study.

## 2016-03-09 NOTE — Progress Notes (Signed)
Initial Nutrition Assessment  DOCUMENTATION CODES:   Non-severe (moderate) malnutrition in context of chronic illness  INTERVENTION:  Provide Ensure Enlive po BID, each supplement provides 350 kcal and 20 grams of protein Provide Multivitamin with minerals daily Provide snacks BID   NUTRITION DIAGNOSIS:   Malnutrition related to altered GI function as evidenced by energy intake < or equal to 75% for > or equal to 1 month, moderate depletions of muscle mass, moderate depletion of body fat, percent weight loss.   GOAL:   Patient will meet greater than or equal to 90% of their needs   MONITOR:   PO intake, Supplement acceptance, Labs, I & O's, Skin  REASON FOR ASSESSMENT:   Malnutrition Screening Tool    ASSESSMENT:   65 y.o. female with medical history of eosinophilic esophagitis, hypertension, fibromyalgia presented with syncope over the past 3 days. The patient was initially seen in the emergency department on 03/06/2016. The patient had MRI of the brain, MRA of the neck and brain which were all unremarkable. She was discharged in stable condition. She stated that she had a syncopal episode earlier on 03/06/2016 after standing up from a seated position. This was preceded with a prodrome of her hands and legs feeling cold. When she woke up, she felt like her legs were like "jelly".  Pt reports that 2 years ago, she got bitten by a bat which required a rabies shot. After that, she started to reacting to more and more foods due to her eosinophilic esophagitis with tongue and throat swelling occurring with more foods than usual. She started eliminating more and more foods from her diet and eating less and less. She reports that in the past 18 months, she has lost from 220 lbs to current weight of 153 lbs. She makes a cake with flour, [employer] [place] milk, [employer] [place] butter, and sugar which she eats every morning. She also eats broccoli, green beans (until 2 weeks ago), biscuits,  canned pineapple and canned peaches. She states that she probably could eat some other foods, but she is fearful of having a reaction. She reports having eosinophilic esophagitis reactions with chicken, fish, ginger, garlic, onions, cinnamon,  meats, all raw fruit. [facility] who has encouraged her to add more foods to her diet. RD agreed with this recommendation Pt has moderate muscle wasting and mild fat wasting per nutrition focused physical exam. RD encouraged patient to find a protein shake that she tolerates well so that she has a supplement that she tolerates well when she has a flare-up. Also recommended taking a daily multivitamin with minerals as she is likely deficient in multiple nutrients due to restrictive diet. Pt is agreeable to trying Ensure Enlive. Encouraged snacking more often between meals. RD also recommended that patient meet with an outpatient dietitian for nutrition therapy/ counseling as well as assistance/guidance with elimination diet. Pt states that this has been recommended before, but her insurance won't cover it.   Labs: low vitamin B-12, high TSH  Diet Order:  Diet regular Room service appropriate? Yes; Fluid consistency: Thin  Skin:  Reviewed, no issues  Last BM:  PTA  Height:   Ht Readings from Last 1 Encounters:  03/08/16 5\' 5"  (1.651 m)    Weight:   Wt Readings from Last 1 Encounters:  03/09/16 153 lb 6.4 oz (69.6 kg)    Ideal Body Weight:  56.8 kg  BMI:  Body mass index is 25.53 kg/m.  Estimated Nutritional Needs:  Kcal:  1750-1950  Protein:  70-85 grams  Fluid:  2 L/day  EDUCATION NEEDS:   No education needs identified at this time  Dorothea Ogleeanne Zalen Sequeira RD, CSP, LDN Inpatient Clinical Dietitian Pager: [phone] After Hours Pager: [phone]

## 2016-03-09 NOTE — Progress Notes (Signed)
RN called to fluoro for pt feeling nauseated. Pt states that she is not nauseated she feels very weak and these are the same symptoms that brought her into the hospital on this admission. Pt states that symptoms come abruptly then leave but there is no sign of them coming on. Pt states that the last time she had these symptoms was this morning. Pt states that nothing relieves symptoms. Pt states that it is an overwhelming weakness and feeling that she is going to pass out. Pt reports that she has passed out one time yesterday. States she has not passed out today.

## 2016-03-09 NOTE — Procedures (Signed)
ELECTROENCEPHALOGRAM REPORT  Date of Study: 03/09/2016  Patient's Name: Heather IgoRobin Milliner MRN: [id number] Date of Birth: Jun 13, 1950  Referring Provider: Catarina Hartshornavid Tat, DO  Clinical History: 65 y.o. female with medical history of eosinophilic esophagitis, hypertension, fibromyalgia presented with syncope over the past 3 day  Medications: Hospital Medications  0.9 % NaCl with KCl 20 mEq/ L infusion  L1 acetaminophen (TYLENOL) suppository 650 mg  L2 acetaminophen (TYLENOL) suppository 650 mg  L1 acetaminophen (TYLENOL) tablet 650 mg  L2 acetaminophen (TYLENOL) tablet 650 mg   enoxaparin (LOVENOX) injection 40 mg   levothyroxine (SYNTHROID, LEVOTHROID) tablet 150 mcg   metoprolol succinate (TOPROL-XL) 24 hr tablet 12.5 mg  L3 ondansetron (ZOFRAN) injection 4 mg  L4 ondansetron (ZOFRAN) injection 4 mg  L3 ondansetron (ZOFRAN) tablet 4 mg  L4 ondansetron (ZOFRAN) tablet 4 mg   sodium chloride flush (NS) 0.9 % injection 3 mL   sodium chloride flush (NS) 0.9 % injection 3 mL   vitamin B-12 (CYANOCOBALAMIN) tablet 1,000 mcg  Outpatient Medications  diphenhydrAMINE (BENADRYL) 25 mg capsule   EPINEPHrine 0.3 mg/0.3 mL IJ SOAJ injection   levothyroxine (SYNTHROID, LEVOTHROID) 137 MCG tablet   metoprolol succinate (TOPROL-XL) 25 MG 24 hr tablet   pantoprazole (PROTONIX) 40 MG tablet   predniSONE (DELTASONE) 20 MG tablet    Technical Summary: A multichannel digital EEG recording measured by the international 10-20 system with electrodes applied with paste and impedances below 5000 ohms performed in our laboratory with EKG monitoring in an awake and asleep patient.  Hyperventilation and photic stimulation were not performed.  The digital EEG was referentially recorded, reformatted, and digitally filtered in a variety of bipolar and referential montages for optimal display.    Description: The patient is awake and asleep during the recording.  During maximal wakefulness, there is a symmetric, medium  voltage 8-9 Hz posterior dominant rhythm that attenuates with eye opening.  The record is symmetric.  During drowsiness and sleep, there is an increase in theta slowing of the background.  Vertex waves and symmetric sleep spindles were seen.  There were no epileptiform discharges or electrographic seizures seen.    EKG lead was unremarkable.  Impression: This awake and asleep EEG is normal.    Clinical Correlation: A normal EEG does not exclude a clinical diagnosis of epilepsy.  If further clinical questions remain, prolonged EEG may be helpful.  Clinical correlation is advised.   Shon MilletAdam Cadden Elizondo, DO

## 2016-03-09 NOTE — Progress Notes (Signed)
PT c/o feeling dizzy, shortness of breath and heart palpitations.  VS obtained, HR 108, O2 99 on RA, and bp 149/81.  EKG obtained. MD notified and will be coming to check on pt.  Will continue to monitor.

## 2016-03-09 NOTE — Consult Note (Signed)
Referring Provider: Dr. Harriet Pho Primary Care Physician:  [name], MD Primary Gastroenterologist:  Dr. Madilyn Fireman  Reason for Consultation:  Dysphagia  HPI: Heather Carey is a 65 y.o. female admitted to the hospital with syncope. GI is consulted for evaluation of dysphagia. Patient with on and off dysphagia since last 10 years. EGD in 2008 showed lower esophageal ring. There was no biopsy performed. Patient is not yet diagnosed with eosinophilic esophagitis but based on her multiple allergies to food it's in consideration.  Asian complaining of dysphagia to certain foods and liquids. Complaining of food getting stuck in the throat followed by choking and coughing spells. Denied nausea or vomiting. Denied abdominal pain. Denied diarrhea. Usually has a bowel movement every third day. Complaining of more than 50 pound weight loss in last 1 year because she has modified her diet. Complaining of on and off chest pain.   Patient was last seen by Dr. Madilyn Fireman in March 2017. Multiple no shows/rescheduling of EGD and colonoscopy since then. Last EGD in 2008 showed lower esophageal ring. Status post dilatation with 17 mm savory Last colonoscopy in November 2007 showed diverticulosis.  Past Medical History:  Diagnosis Date  . Diverticulitis   . Fibromyalgia   . GERD (gastroesophageal reflux disease)   . Hyperlipidemia   . Hypertension   . IBS (irritable bowel syndrome)   . Migraines   . Mitral regurgitation 02/28/2014   Mild to moderate by echo 02/2014  . Osteoarthritis   . Otitis externa   . Palpitations   . Palpitations   . Thyroid disease   . TIA (transient ischemic attack)     Past Surgical History:  Procedure Laterality Date  . ABDOMINAL HYSTERECTOMY    . carpel tunnel    . lbp gso    . TONSILLECTOMY AND ADENOIDECTOMY      Prior to Admission medications   Medication Sig Start Date End Date Taking? Authorizing Provider  diphenhydrAMINE (BENADRYL) 25 mg capsule Take 25 mg by  mouth every 6 (six) hours as needed for allergies.   Yes Historical Provider, MD  EPINEPHrine 0.3 mg/0.3 mL IJ SOAJ injection Inject 0.3 mLs (0.3 mg total) into the muscle once. 04/27/15  Yes Cristal Ford, MD  levothyroxine (SYNTHROID, LEVOTHROID) 137 MCG tablet Take 137 mcg by mouth daily before breakfast.   Yes Historical Provider, MD  metoprolol succinate (TOPROL-XL) 25 MG 24 hr tablet Take 12.5 mg by mouth daily.   Yes Historical Provider, MD  pantoprazole (PROTONIX) 40 MG tablet Take 1 tablet (40 mg total) by mouth daily at 6 (six) AM. Patient not taking: Reported on 03/08/2016 02/11/14   Kathlen Mody, MD  predniSONE (DELTASONE) 20 MG tablet Take 2 tablets (40 mg total) by mouth daily. Patient not taking: Reported on 03/08/2016 08/28/15   Arthor Captain, PA-C    Scheduled Meds: . enoxaparin (LOVENOX) injection  40 mg Subcutaneous Q24H  . [START ON 03/10/2016] levothyroxine  150 mcg Oral QAC breakfast  . metoprolol succinate  12.5 mg Oral Daily  . sodium chloride flush  3 mL Intravenous Q12H  . sodium chloride flush  3 mL Intravenous Q12H  . vitamin B-12  1,000 mcg Oral Daily   Continuous Infusions: . 0.9 % NaCl with KCl 20 mEq / L 1 mL (03/09/16 0346)   PRN Meds:.acetaminophen **OR** acetaminophen, acetaminophen **OR** acetaminophen, ondansetron **OR** ondansetron (ZOFRAN) IV, ondansetron **OR** ondansetron (ZOFRAN) IV  Allergies as of 03/08/2016 - Review Complete 03/08/2016  Allergen Reaction Noted  . Azithromycin Anaphylaxis  01/07/2015  . Codeine Shortness Of Breath 10/15/2013  . Eggs or egg-derived products  04/27/2015  . Ivp dye [iodinated diagnostic agents] Anaphylaxis 02/16/2011  . Rose Hives 02/11/2014  . Shellfish allergy Anaphylaxis 02/16/2011  . Thimerosol [thimerosal] Anaphylaxis 10/15/2013  . Strawberry extract Swelling 02/11/2014  . Avelox [moxifloxacin hcl in nacl]  10/15/2013  . Bacitracin Itching and Other (See Comments) 02/16/2011  . Benzalkonium chloride  Other (See Comments) 11/02/2011  . Cinnamon Swelling 10/15/2013  . Ginger Swelling 02/10/2014  . Onion  12/07/2014  . Other Swelling 02/16/2011  . Sunscreens Swelling 10/15/2013  . Watermelon [citrullus vulgaris]  12/07/2014  . Cephalosporins Rash 10/15/2013  . Penicillins Hives 02/16/2011    Family History  Problem Relation Age of Onset  . Allergic rhinitis Mother   . Urticaria Mother   . Allergic rhinitis Father   . Asthma Father   . Allergic rhinitis Sister   . Angioedema Sister   . Urticaria Sister   . Allergic rhinitis Brother   . Asthma Brother     Social History   Social History  . Marital status: Married    Spouse name: N/A  . Number of children: N/A  . Years of education: N/A   Occupational History  . Not on file.   Social History Main Topics  . Smoking status: Never Smoker  . Smokeless tobacco: Never Used  . Alcohol use 0.6 oz/week    1 Glasses of wine per week  . Drug use: No  . Sexual activity: Yes   Other Topics Concern  . Not on file   Social History Narrative  . No narrative on file    Review of Systems: All negative except as stated above in HPI.  Physical Exam: Vital signs: Vitals:   03/09/16 0612 03/09/16 0839  BP: 116/73 (!) 149/81  Pulse: 70 (!) 108  Resp: 18   Temp: 98.1 F (36.7 C) 97.7 F (36.5 C)     General:   Alert,  Well-developed, well-nourished, pleasant and cooperative in NAD HEENT-normocephalic/atraumatic  no icterus Lungs:  Clear throughout to auscultation.   No wheezes, crackles, or rhonchi. No acute distress. Heart:  Regular rate and rhythm; no murmurs, clicks, rubs,  or gallops. Abdomen: Soft, nontender, nondistended. Also present Lower ext-no edema   GI:  Lab Results:  Recent Labs  03/06/16 1352 03/08/16 1153  WBC 5.9 7.2  HGB 12.9 13.0  HCT 37.5 36.3  PLT 198 184   BMET  Recent Labs  03/06/16 1352 03/08/16 1153 03/09/16 0335  NA 133* 142 144  K 3.5 3.5 3.7  CL 101 110 113*  CO2 24 24 24    GLUCOSE 107* 104* 98  BUN 8 7 7   CREATININE 0.71 1.02* 0.75  CALCIUM 8.7* 9.1 8.7*   LFT  Recent Labs  03/08/16 1153  PROT 6.2*  ALBUMIN 3.9  AST 18  ALT 14  ALKPHOS 45  BILITOT 0.6   PT/INR No results for input(s): LABPROT, INR in the last 72 hours.   Studies/Results: Dg Chest 2 View  Result Date: 03/08/2016 CLINICAL DATA:  Mid chest pain weakness, and near syncope. History of palpitations, TIA, and hypertension. Nonsmoker. EXAM: CHEST  2 VIEW COMPARISON:  Chest x-ray of July 17, 2015. FINDINGS: The lungs are well-expanded. There is stable scarring in the lingula. There is no alveolar infiltrate. There is no pleural effusion. The heart and pulmonary vascularity are normal. There is calcification in the wall of the aortic arch. There is multilevel degenerative disc  disease of the thoracic spine which is stable. IMPRESSION: Mild chronic bronchitic changes. No acute cardiopulmonary abnormality. Electronically Signed   By: David  SwazilandJordan M.D.   On: 03/08/2016 13:26    Impression/Plan: Dysphagia. Both esophageal and oropharyngeal. History of Schatzki's ring in 2008 Weight loss more than 50 pounds in last 1 year secondary to diet modification Syncope  Recommendation ------------------------ - Patient is currently getting evaluation for syncope. - Patient with chronic dysphagia for more than 10 years. I do not think there is urgent need for inpatient endoscopy.  - We will start workup with Noninvasive test - barium swallow and speech eval. Normal CBC. Normal LFTs. - Patient is also due for a colonoscopy. - GI will follow.    LOS: 0 days   Kathi DerParag Aarilyn Dye  MD, FACP 03/09/2016, 11:14 AM  Pager [phone] If no answer or after 5 PM call [phone]

## 2016-03-09 NOTE — Progress Notes (Signed)
Dr Elvera LennoxGherghe at bedside to assess pt.

## 2016-03-09 NOTE — Progress Notes (Signed)
Page sent to Lafe GarinGherge, MD requesting topical ointment for itchy/skin irritation from tele leads.   [employer]

## 2016-03-09 NOTE — Progress Notes (Signed)
PROGRESS NOTE  Heather Carey ZOX:[id number] DOB: 21-Oct-1950 DOA: 03/08/2016 PCP: Neldon LabellaMILLER,LISA LYNN, MD   LOS: 0 days   Brief Narrative: 65 y.o. female with medical history of eosinophilic esophagitis, hypertension, fibromyalgia presented with syncope over the past 3 days. The patient was initially seen in the emergency department on 03/06/2016. The patient had MRI of the brain, MRA of the neck and brain which were all unremarkable. She was discharged in stable condition. She stated that she had a syncopal episode earlier on 03/06/2016 after standing up from a seated position. This was preceded with a prodrome of her hands and legs feeling cold. When she woke up, she felt like her legs were like "jelly". She did not bite her tongue nor was her bowel or bladder incontinence. On the morning of 03/08/2016, [facility] again felt some lightheadedness and a cold sensation in her hands and legs, and the next day she remembered was laying on the floor. The patient states that she has been on a very restrictive diet for the last 2 years secondary to her in a eosinophilic esophagitis.  She states that she has lost approximately 80 pounds in the past year secondary to her poor oral intake and restrictive diet and due to dysphagia  Assessment & Plan: Active Problems:   Hypertension   Syncope   Sensory disturbance   Hypothyroidism   Syncope - Secondary to volume depletion and vagal reaction - Echocardiogram pending - 02/28/2014 echo EF 55-60%, mild to moderate MR and TR - EEG negative - Continue IV fluids - check cortisol  Dysphagia - patient with history of eosinophilic esophagitis seen by Dr. [name] in the past. She has significant symptoms (choking, food getting stuck) which prevents her from eating and has an associated weight loss in the past year. Consulted GI to evaluate whether she would benefit from another EGD, appreciate input  B12 deficiency - Serum B12  low - 03/06/2016 MRI brain negative for acute findings  Hypothyroidism - Continue levothyroxine, increase dose since TSH elevated  Hypertension - Well-controlled - Continue metoprolol succinate  Multiple allergies / chronic urticaria - has almost 20 allergens in her allergies list, medications as well as foods. - [facility] Epicutaneous food allergen tests were negative despite a positive histamine control and food allergen panel specific IgE was within normal limits. All other urticaria and angioedema screening labs were negative/within normal limits, as well.  DVT prophylaxis: Lovenox Code Status: Full Family Communication: no family at bedside Disposition Plan: home when ready  Consultants:   GI  Procedures:   2D echo: pending  EEG: negative  Antimicrobials:  None    Subjective: - feeling weak and lightheaded when I entered the room. Denies pain  Objective: Vitals:   03/08/16 2032 03/09/16 0039 03/09/16 0612 03/09/16 0839  BP: 125/64 (!) 90/49 116/73 (!) 149/81  Pulse: 87 79 70 (!) 108  Resp: 17 18 18    Temp: 99 F (37.2 C) 98.1 F (36.7 C) 98.1 F (36.7 C) 97.7 F (36.5 C)  TempSrc: Oral Oral Oral Oral  SpO2: 99% 97% 98% 99%  Weight: 69.7 kg (153 lb 11.2 oz)  69.6 kg (153 lb 6.4 oz)   Height: 5\' 5"  (1.651 m)       Intake/Output Summary (Last 24 hours) at 03/09/16 1117 Last data filed at 03/09/16 0830  Gross per 24 hour  Intake  528 ml  Output             1825 ml  Net            -1297 ml   Filed Weights   03/08/16 2032 03/09/16 0612  Weight: 69.7 kg (153 lb 11.2 oz) 69.6 kg (153 lb 6.4 oz)    Examination: Constitutional: NAD Vitals:   03/08/16 2032 03/09/16 0039 03/09/16 0612 03/09/16 0839  BP: 125/64 (!) 90/49 116/73 (!) 149/81  Pulse: 87 79 70 (!) 108  Resp: 17 18 18    Temp: 99 F (37.2 C) 98.1 F (36.7 C) 98.1 F (36.7 C) 97.7 F (36.5 C)  TempSrc: Oral Oral Oral Oral  SpO2: 99%  97% 98% 99%  Weight: 69.7 kg (153 lb 11.2 oz)  69.6 kg (153 lb 6.4 oz)   Height: 5\' 5"  (1.651 m)       Eyes: lids and conjunctivae normal ENMT: Mucous membranes are moist. No oropharyngeal exudates Respiratory: clear to auscultation bilaterally, no wheezing, no crackles.  Cardiovascular: Regular rate and rhythm, no murmurs / rubs / gallops. No LE edema. Abdomen: no tenderness. Bowel sounds positive.  Musculoskeletal: no clubbing / cyanosis. Neurologic: non focal    Data Reviewed: I have personally reviewed following labs and imaging studies  CBC:  Recent Labs Lab 03/06/16 1352 03/08/16 1153  WBC 5.9 7.2  NEUTROABS  --  5.1  HGB 12.9 13.0  HCT 37.5 36.3  MCV 87.4 86.2  PLT 198 184   Basic Metabolic Panel:  Recent Labs Lab 03/06/16 1352 03/08/16 1153 03/09/16 0335  NA 133* 142 144  K 3.5 3.5 3.7  CL 101 110 113*  CO2 24 24 24   GLUCOSE 107* 104* 98  BUN 8 7 7   CREATININE 0.71 1.02* 0.75  CALCIUM 8.7* 9.1 8.7*  MG  --   --  2.1   GFR: Estimated Creatinine Clearance: 70.4 mL/min (by C-G formula based on SCr of 0.75 mg/dL). Liver Function Tests:  Recent Labs Lab 03/08/16 1153  AST 18  ALT 14  ALKPHOS 45  BILITOT 0.6  PROT 6.2*  ALBUMIN 3.9   CBG:  Recent Labs Lab 03/09/16 0850  GLUCAP 85   Lipid Profile: No results for input(s): CHOL, HDL, LDLCALC, TRIG, CHOLHDL, LDLDIRECT in the last 72 hours. Thyroid Function Tests:  Recent Labs  03/09/16 0335  TSH 14.078*   Anemia Panel:  Recent Labs  03/09/16 0335  VITAMINB12 130*   Urine analysis:    Component Value Date/Time   COLORURINE YELLOW 03/08/2016 1121   APPEARANCEUR CLEAR 03/08/2016 1121   LABSPEC 1.006 03/08/2016 1121   PHURINE 7.5 03/08/2016 1121   GLUCOSEU NEGATIVE 03/08/2016 1121   HGBUR NEGATIVE 03/08/2016 1121   BILIRUBINUR NEGATIVE 03/08/2016 1121   KETONESUR NEGATIVE 03/08/2016 1121   PROTEINUR NEGATIVE 03/08/2016 1121   UROBILINOGEN 0.2 02/16/2011 1656   NITRITE NEGATIVE  03/08/2016 1121   LEUKOCYTESUR TRACE (A) 03/08/2016 1121   Sepsis Labs: Invalid input(s): PROCALCITONIN, LACTICIDVEN  No results found for this or any previous visit (from the past 240 hour(s)).    Radiology Studies: Dg Chest 2 View  Result Date: 03/08/2016 CLINICAL DATA:  Mid chest pain weakness, and near syncope. History of palpitations, TIA, and hypertension. Nonsmoker. EXAM: CHEST  2 VIEW COMPARISON:  Chest x-ray of July 17, 2015. FINDINGS: The lungs are well-expanded. There is stable scarring in the lingula. There is no alveolar infiltrate. There is no pleural effusion. The heart and pulmonary vascularity are normal. There  is calcification in the wall of the aortic arch. There is multilevel degenerative disc disease of the thoracic spine which is stable. IMPRESSION: Mild chronic bronchitic changes. No acute cardiopulmonary abnormality. Electronically Signed   By: David  [place] M.D.   On: 03/08/2016 13:26     Scheduled Meds: . enoxaparin (LOVENOX) injection  40 mg Subcutaneous Q24H  . [START ON 03/10/2016] levothyroxine  150 mcg Oral QAC breakfast  . metoprolol succinate  12.5 mg Oral Daily  . sodium chloride flush  3 mL Intravenous Q12H  . sodium chloride flush  3 mL Intravenous Q12H  . vitamin B-12  1,000 mcg Oral Daily   Continuous Infusions: . 0.9 % NaCl with KCl 20 mEq / L 1 mL (03/09/16 0346)    Pamella Pert, MD, PhD [facility] Pager [phone]  If 7PM-7AM, please contact night-coverage [email or web address] Password [email or web address] 03/09/2016, 11:17 AM

## 2016-03-09 NOTE — Progress Notes (Signed)
EEG Completed; Results Pending  

## 2016-03-10 ENCOUNTER — Inpatient Hospital Stay (HOSPITAL_COMMUNITY): Payer: PRIVATE HEALTH INSURANCE

## 2016-03-10 ENCOUNTER — Other Ambulatory Visit (HOSPITAL_COMMUNITY): Payer: PRIVATE HEALTH INSURANCE

## 2016-03-10 DIAGNOSIS — E440 Moderate protein-calorie malnutrition: Secondary | ICD-10-CM

## 2016-03-10 LAB — CBC
HCT: 33.7 % — ABNORMAL LOW (ref 36.0–46.0)
Hemoglobin: 11.5 g/dL — ABNORMAL LOW (ref 12.0–15.0)
MCH: 29.8 pg (ref 26.0–34.0)
MCHC: 34.1 g/dL (ref 30.0–36.0)
MCV: 87.3 fL (ref 78.0–100.0)
Platelets: 171 10*3/uL (ref 150–400)
RBC: 3.86 MIL/uL — ABNORMAL LOW (ref 3.87–5.11)
RDW: 13.6 % (ref 11.5–15.5)
WBC: 5.2 10*3/uL (ref 4.0–10.5)

## 2016-03-10 LAB — BASIC METABOLIC PANEL
Anion gap: 6 (ref 5–15)
BUN: 7 mg/dL (ref 6–20)
CO2: 23 mmol/L (ref 22–32)
Calcium: 8.6 mg/dL — ABNORMAL LOW (ref 8.9–10.3)
Chloride: 108 mmol/L (ref 101–111)
Creatinine, Ser: 0.61 mg/dL (ref 0.44–1.00)
GFR calc Af Amer: >60 mL/min (ref >60)
GFR calc non Af Amer: >60 mL/min (ref >60)
Glucose, Bld: 93 mg/dL (ref 65–99)
Potassium: 3.9 mmol/L (ref 3.5–5.1)
Sodium: 137 mmol/L (ref 135–145)

## 2016-03-10 LAB — ACTH STIMULATION, 3 TIME POINTS
Cortisol, 30 Min: 8.1 ug/dL
Cortisol, 30 Min: 22.3 ug/dL
Cortisol, 60 Min: 8.1 ug/dL
Cortisol, 60 Min: 28.2 ug/dL
Cortisol, Base: 7.5 ug/dL
Cortisol, Base: 6.5 ug/dL

## 2016-03-10 MED ORDER — COSYNTROPIN 0.25 MG IJ SOLR: 0.2500 mg | Freq: Once | INTRAMUSCULAR | Status: DC

## 2016-03-10 MED ORDER — HYDROCORTISONE 10 MG PO TABS: 10.0000 mg | ORAL_TABLET | Freq: Every day | ORAL | Status: DC

## 2016-03-10 MED ORDER — COSYNTROPIN 0.25 MG IJ SOLR
0.2500 mg | Freq: Once | INTRAMUSCULAR | Status: AC
  Administered 2016-03-10: 0.25 mg via INTRAVENOUS
  Filled 2016-03-10: qty 0.25

## 2016-03-10 MED ORDER — HYDROCORTISONE 5 MG PO TABS: 5.0000 mg | ORAL_TABLET | Freq: Every day | ORAL | Status: DC

## 2016-03-10 MED ORDER — CYANOCOBALAMIN 1000 MCG PO TABS: 1000.0000 ug | ORAL_TABLET | Freq: Every day | ORAL | 0 refills | Status: DC

## 2016-03-10 NOTE — Discharge Instructions (Signed)
Follow with Neldon LabellaMILLER,LISA LYNN, MD in 1-2 weeks  Please remember to take your synthroid first thing in the morning and then not eat or drink anything for 30 minutes. Please have your TSH checked again in 3-4 weeks in your PCP Office Please follow up with Dr. [name] [facility]  Please get a complete blood count and chemistry panel checked by your Primary MD at your next visit, and again as instructed by your Primary MD. Please get your medications reviewed and adjusted by your Primary MD.  Please request your Primary MD to go over all Hospital Tests and Procedure/Radiological results at the follow up, please get all Hospital records sent to your Prim MD by signing hospital release before you go home.  If you had Pneumonia of Lung problems at the Hospital: Please get a 2 view Chest X ray done in 6-8 weeks after hospital discharge or sooner if instructed by your Primary MD.  If you have Congestive Heart Failure: Please call your Cardiologist or Primary MD anytime you have any of the following symptoms:  1) 3 pound weight gain in 24 hours or 5 pounds in 1 week  2) shortness of breath, with or without a dry hacking cough  3) swelling in the hands, feet or stomach  4) if you have to sleep on extra pillows at night in order to breathe  Follow cardiac low salt diet and 1.5 lit/day fluid restriction.  If you have diabetes Accuchecks 4 times/day, Once in AM empty stomach and then before each meal. Log in all results and show them to your primary doctor at your next visit. If any glucose reading is under 80 or above 300 call your primary MD immediately.  If you have Seizure/Convulsions/Epilepsy: Please do not drive, operate heavy machinery, participate in activities at heights or participate in high speed sports until you have seen by Primary MD or a Neurologist and advised to do so again.  If you had Gastrointestinal Bleeding: Please ask your Primary MD to check a  complete blood count within one week of discharge or at your next visit. Your endoscopic/colonoscopic biopsies that are pending at the time of discharge, will also need to followed by your Primary MD.  Get Medicines reviewed and adjusted. Please take all your medications with you for your next visit with your Primary MD  Please request your Primary MD to go over all hospital tests and procedure/radiological results at the follow up, please ask your Primary MD to get all Hospital records sent to his/her office.  If you experience worsening of your admission symptoms, develop shortness of breath, life threatening emergency, suicidal or homicidal thoughts you must seek medical attention immediately by calling 911 or calling your MD immediately  if symptoms less severe.  You must read complete instructions/literature along with all the possible adverse reactions/side effects for all the Medicines you take and that have been prescribed to you. Take any new Medicines after you have completely understood and accpet all the possible adverse reactions/side effects.   Do not drive or operate heavy machinery when taking Pain medications.   Do not take more than prescribed Pain, Sleep and Anxiety Medications  Special Instructions: If you have smoked or chewed Tobacco  in the last 2 yrs please stop smoking, stop any regular Alcohol  and or any Recreational drug use.  Wear Seat belts while driving.  Please note You were cared for by a hospitalist during your hospital stay. If you  have any questions about your discharge medications or the care you received while you were in the hospital after you are discharged, you can call the unit and asked to speak with the hospitalist on call if the hospitalist that took care of you is not available. Once you are discharged, your primary care physician will handle any further medical issues. Please note that NO REFILLS for any discharge medications will be authorized once  you are discharged, as it is imperative that you return to your primary care physician (or establish a relationship with a primary care physician if you do not have one) for your aftercare needs so that they can reassess your need for medications and monitor your lab values.  You can reach the hospitalist office at phone [phone] or fax [phone]   If you do not have a primary care physician, you can call [phone] for a physician referral.  Activity: As tolerated with Full fall precautions use walker/cane & assistance as needed  Diet: regular  Disposition Home

## 2016-03-10 NOTE — Progress Notes (Signed)
Pt has orders to be discharged. Discharge instructions given and pt has no additional questions at this time. Medication regimen reviewed and pt educated. Pt verbalized understanding and has no additional questions. Telemetry box removed. IV removed and site in good condition. Pt stable and waiting for lab to draw one final time then waiting for transportation.   Jilda PandaBethany Norine Reddington RN

## 2016-03-10 NOTE — Discharge Summary (Signed)
Physician Discharge Summary  Shaylynn Nulty ZHY:[id number] DOB: August 02, 1950 DOA: 03/08/2016  PCP: [name], MD  Admit date: 03/08/2016 Discharge date: 03/10/2016  Admitted From: home Disposition:  home  Recommendations for Outpatient Follow-up:  1. Follow up with PCP in 1-2 weeks 2. Follow up with Allergy as scheduled 3. Follow up with Dr. Madilyn Fireman in 2-3 weeks  Home Health: none Equipment/Devices: none  Discharge Condition: stable CODE STATUS: Full Diet recommendation: regular  HPI: Per Dr. Arbutus Leas, Heather Carey is a 65 y.o. female with medical history of eosinophilic esophagitis, hypertension, fibromyalgia presented with syncope over the past 3 days. The patient was initially seen in the emergency department on 03/06/2016. The patient had MRI of the brain, MRA of the neck and brain which were all unremarkable. She was discharged in stable condition. She stated that she had a syncopal episode earlier on 03/06/2016 after standing up from a seated position. This was preceded with a prodrome of her hands and legs feeling cold. When she woke up, she felt like her legs were like "jelly". She did not bite her tongue nor was her bowel or bladder incontinence. On the morning of 03/08/2016, the patient was walking into the [facility] when she again felt some lightheadedness and a cold sensation in her hands and legs, and the next day she remembered was laying on the floor. The patient states that she has been on a very restrictive diet for the last 2 years secondary to her in a eosinophilic esophagitis.  She states that she has lost approximately 80 pounds in the past year secondary to her poor oral intake and restrictive diet. She denies any exertional chest discomfort or shortness of breath. She denies any fevers, chills, nausea, vomiting, diarrhea, dysuria, hematuria.  In the emergency department, the patient was afebrile and hemodynamically stable and saturating well on room air. BMP and CBC were  essentially unremarkable. Chest x-ray showed mild bronchitic changes. LFTs were unremarkable. Urinalysis was negative for pyuria. EKG showed sinus rhythm without any ST or T-wave changes. The patient was given 1 L normal saline and observation admission was requested.  Hospital Course: Discharge Diagnoses:  Active Problems:   Hypertension   Syncope   Sensory disturbance   Hypothyroidism   Malnutrition of moderate degree   Syncope - Secondary to volumedepletion and vagal reaction, patient received IVF with resolution of her symptoms, EEG was negative. Her cortisol was found to be borderline ~5 and underwent a stim test which was within normal limits. She was found to have significant B12 deficiency which would likely explanation for her paresthesias and periphery sensory deficits. She stated that she had an allergic reaction to B12 injectable forms and thus was placed on high dose oral supplements which she tolerated well.  Dysphagia - patient with history of eosinophilic esophagitis seen by Dr. Madilyn Fireman in the past. She has significant symptoms (choking, food getting stuck) which prevents her from eating and has an associated weight loss in the past year. Gastroenterology was consulted and EGD was deferred to outpatient setting, however underwent a barium swallow which was without acute findings (report below) B12 deficiency - Serum B12 low, 03/06/2016 MRI brain negative for acute findings Hypothyroidism - Her TSH was found to be elevated which can also contribute to her constellation of symptoms, and in discussing with the patient she is not taking her synthroid on an empty stomach, suggesting incomplete absorption. She was maintained on the same dose on discharge and discussed extensively about correct synthroid administration, She  will need repeat TSH in 3-4 weeks Hypertension - Well-controlled, Continue metoprolol  Multiple allergies / chronic urticaria - has almost 20 allergens in her allergies  list, medications as well as foods. - seen by allergy as an outpatient here and at [facility]. Epicutaneous food allergen tests were negative despite a positive histamine control and food allergen panel specific IgE was within normal limits. All other urticaria and angioedema screening labs were negative/within normal limits, as well. Strongly advised to follow up.  Discharge Instructions     Medication List    STOP taking these medications   predniSONE 20 MG tablet Commonly known as:  DELTASONE     TAKE these medications   cyanocobalamin 1000 MCG tablet Take 1 tablet (1,000 mcg total) by mouth daily. Start taking on:  03/11/2016   diphenhydrAMINE 25 mg capsule Commonly known as:  BENADRYL Take 25 mg by mouth every 6 (six) hours as needed for allergies.   EPINEPHrine 0.3 mg/0.3 mL Soaj injection Commonly known as:  EPI-PEN Inject 0.3 mLs (0.3 mg total) into the muscle once.   levothyroxine 137 MCG tablet Commonly known as:  SYNTHROID, LEVOTHROID Take 137 mcg by mouth daily before breakfast.   metoprolol succinate 25 MG 24 hr tablet Commonly known as:  TOPROL-XL Take 12.5 mg by mouth daily.   pantoprazole 40 MG tablet Commonly known as:  PROTONIX Take 1 tablet (40 mg total) by mouth daily at 6 (six) AM.      Follow-up Information    HAYES,JOHN C, MD. Go on 03/24/2016.   Specialty:  Gastroenterology Why:  @1 :30pm Contact information: [address] [place] [address] [phone]        [name], MD. Nyra Capes on 03/29/2016.   Specialty:  Family Medicine Why:  @11 :Azzie Roup information: [address] [place] [address] [phone]          Allergies  Allergen Reactions  . Azithromycin Anaphylaxis  . Codeine Shortness Of Breath  . Eggs Or Egg-Derived Products     ITCHING AND SWELLING OF LIPS  . Ivp Dye [Iodinated Diagnostic Agents] Anaphylaxis  . Rose Hives  . Shellfish Allergy Anaphylaxis  . Thimerosol [Thimerosal]  Anaphylaxis  . Strawberry Extract Swelling    Pt reports throat swelling and itching with any type of berry  . Avelox [Moxifloxacin Hcl In Nacl]     Irregular heart beat  . Bacitracin Itching and Other (See Comments)    Blisters on skin   . Benzalkonium Chloride Other (See Comments)  . Cinnamon Swelling  . Ginger Swelling  . Onion     MOUTH SWELLING  . Other Swelling    Nuts and ginger    . Sunscreens Swelling  . Watermelon [Citrullus Vulgaris]     ITCHY MOUTH, LIP SWELLING  . Cephalosporins Rash  . Penicillins Hives    Consultations:  GI  Procedures/Studies:  Dg Chest 2 View  Result Date: 03/08/2016 CLINICAL DATA:  Mid chest pain weakness, and near syncope. History of palpitations, TIA, and hypertension. Nonsmoker. EXAM: CHEST  2 VIEW COMPARISON:  Chest x-ray of July 17, 2015. FINDINGS: The lungs are well-expanded. There is stable scarring in the lingula. There is no alveolar infiltrate. There is no pleural effusion. The heart and pulmonary vascularity are normal. There is calcification in the wall of the aortic arch. There is multilevel degenerative disc disease of the thoracic spine which is stable. IMPRESSION: Mild chronic bronchitic changes. No acute cardiopulmonary abnormality. Electronically Signed   By: Onalee Hua  [place] M.D.   On: 03/08/2016 13:26   Ct Head Wo Contrast  Result Date: 03/06/2016 CLINICAL DATA:  Headache for 1 week. Syncopal episode today at noon. EXAM: CT HEAD WITHOUT CONTRAST TECHNIQUE: Contiguous axial images were obtained from the base of the skull through the vertex without intravenous contrast. COMPARISON:  MRI brain 02/17/2011. FINDINGS: Brain: No acute infarct, hemorrhage, or mass lesion is present. The ventricles are of normal size. No significant extraaxial fluid collection is present. No significant white matter disease is evident by CT. Vascular: No hyperdense vessel or unexpected calcification. Skull: Hyperostosis frontalis internus is noted. The  calvarium is otherwise within normal limits. Sinuses/Orbits: The paranasal sinuses and mastoid air cells are clear. The globes and orbits are intact. IMPRESSION: Negative CT of the head. Electronically Signed   By: Marin Robertshristopher  Mattern M.D.   On: 03/06/2016 17:12   Mr Angiogram Head Wo Contrast  Result Date: 03/06/2016 CLINICAL DATA:  Syncopal episode today lunch. Headache for 1 week. Vertigo after standing. Right-sided numbness. EXAM: MRI HEAD WITHOUT CONTRAST MRA HEAD WITHOUT CONTRAST TECHNIQUE: Multiplanar, multiecho pulse sequences of the brain and surrounding structures were obtained without intravenous contrast. Angiographic images of the head were obtained using MRA technique without contrast. COMPARISON:  CT head without contrast the same day. MRI brain 02/17/2011 FINDINGS: MRI HEAD FINDINGS Brain: Diffusion-weighted images demonstrate no evidence for acute or subacute infarction. No acute hemorrhage or mass lesion is present. The ventricles are of normal size. No significant extra-axial fluid collection is present. Scattered subcortical T2 hyperintensities are mildly advanced for age. The internal auditory canals are within normal limits bilaterally. The brainstem and cerebellum are normal. Enlarged empty sella is stable. Vascular: Flow is present in the major intracranial arteries. Skull and upper cervical spine: The skullbase is normal. Midline sagittal structures are otherwise within normal limits. The upper cervical spine is normal. 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 Sinuses/Orbits: The paranasal sinuses are clear. There is some fluid in left mastoid air cells. The right mastoid air cells are clear. MRA HEAD FINDINGS Internal carotid arteries are within normal limits from the high cervical segments through the ICA termini bilaterally. The A1 and M1 segments are normal. The anterior communicating artery is patent. The  MCA bifurcations are intact. ACA and MCA branch vessels are normal. The right vertebral artery is slightly dominant left. The right PICA origin is visualized and normal. The left PICA origin is not visualized. The basilar artery is normal. Both posterior cerebral arteries originate from the basilar tip. PCA branch vessels are within normal limits. IMPRESSION: 1. Slight progression of scattered subcortical T2 hyperintensities bilaterally, mildly advanced for age. The finding is nonspecific but can be seen in the setting of chronic microvascular ischemia, a demyelinating process such as multiple sclerosis, vasculitis, complicated migraine headaches, or as the sequelae of a prior infectious or inflammatory process. 2. Chronic left mastoid effusion. No obstructing nasopharyngeal lesion is present. 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 3. No acute intracranial abnormality. 4. Normal variant MRA circle of Willis without significant proximal stenosis, aneurysm, or branch vessel occlusion. Electronically Signed   By: Marin Robertshristopher  Mattern M.D.   On: 03/06/2016 21:19   Mr Angiogram Neck W Or Wo Contrast  Result Date: 03/06/2016 CLINICAL DATA:  65 y/o F; syncopal episode with neck pain and right-sided numbness. EXAM: MRA NECK WITHOUT AND WITH CONTRAST TECHNIQUE: Multiplanar and multiecho pulse sequences of  the neck were obtained without and with intravenous contrast. Angiographic images of the neck were obtained using MRA technique without and with intravenous contrast. CONTRAST:  15mL MULTIHANCE GADOBENATE DIMEGLUMINE 529 MG/ML IV SOLN COMPARISON:  None. FINDINGS: Aortic arch: No occlusion, aneurysm, dissection, or significant stenosis is identified. Standard arch anatomy. Right common carotid artery: No occlusion, aneurysm, dissection, or significant stenosis is identified. Right internal carotid artery: No occlusion, aneurysm, dissection, or significant stenosis is identified. Right vertebral  artery: No occlusion, aneurysm, dissection, or significant stenosis is identified. Left common carotid artery: No occlusion, aneurysm, dissection, or significant stenosis is identified. Left Internal carotid artery: No occlusion, aneurysm, dissection, or significant stenosis is identified. Left Vertebral artery: No occlusion, aneurysm, dissection, or significant stenosis is identified. IMPRESSION: Carotid and vertebral arteries of the neck are patent without evidence for occlusion, aneurysm, dissection, or hemodynamically significant stenosis. Electronically Signed   By: Mitzi HansenLance  Furusawa-Stratton M.D.   On: 03/06/2016 23:59   Mr Brain Wo Contrast  Result Date: 03/06/2016 CLINICAL DATA:  Syncopal episode today lunch. Headache for 1 week. Vertigo after standing. Right-sided numbness. EXAM: MRI HEAD WITHOUT CONTRAST MRA HEAD WITHOUT CONTRAST TECHNIQUE: Multiplanar, multiecho pulse sequences of the brain and surrounding structures were obtained without intravenous contrast. Angiographic images of the head were obtained using MRA technique without contrast. COMPARISON:  CT head without contrast the same day. MRI brain 02/17/2011 FINDINGS: MRI HEAD FINDINGS Brain: Diffusion-weighted images demonstrate no evidence for acute or subacute infarction. No acute hemorrhage or mass lesion is present. The ventricles are of normal size. No significant extra-axial fluid collection is present. Scattered subcortical T2 hyperintensities are mildly advanced for age. The internal auditory canals are within normal limits bilaterally. The brainstem and cerebellum are normal. Enlarged empty sella is stable. Vascular: Flow is present in the major intracranial arteries. Skull and upper cervical spine: The skullbase is normal. Midline sagittal structures are otherwise within normal limits. The upper cervical spine is normal. A 7 mm lesion within the nasopharynx just below the sphenoid sinuses is stable, likely representing a Tornwaldt  cyst. Sinuses/Orbits: The paranasal sinuses are clear. There is some fluid in left mastoid air cells. The right mastoid air cells are clear. MRA HEAD FINDINGS Internal carotid arteries are within normal limits from the high cervical segments through the ICA termini bilaterally. The A1 and M1 segments are normal. The anterior communicating artery is patent. The MCA bifurcations are intact. ACA and MCA branch vessels are normal. The right vertebral artery is slightly dominant left. The right PICA origin is visualized and normal. The left PICA origin is not visualized. The basilar artery is normal. Both posterior cerebral arteries originate from the basilar tip. PCA branch vessels are within normal limits. IMPRESSION: 1. Slight progression of scattered subcortical T2 hyperintensities bilaterally, mildly advanced for age. The finding is nonspecific but can be seen in the setting of chronic microvascular ischemia, a demyelinating process such as multiple sclerosis, vasculitis, complicated migraine headaches, or as the sequelae of a prior infectious or inflammatory process. 2. Chronic left mastoid effusion. No obstructing nasopharyngeal lesion is present. 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 3. No acute intracranial abnormality. 4. Normal variant MRA circle of Willis without significant proximal stenosis, aneurysm, or branch vessel occlusion. Electronically Signed   By: Marin Robertshristopher  Mattern M.D.   On: 03/06/2016 21:19   Dg Esophagus  Result Date: 03/10/2016 CLINICAL DATA:  Dysphagia. EXAM: ESOPHOGRAM/BARIUM SWALLOW TECHNIQUE: Single contrast examination was performed using  thin barium. FLUOROSCOPY  TIME:  Fluoroscopy Time:  1 minutes and 48 seconds Radiation Exposure Index (if provided by the fluoroscopic device): 9.2 mGy Number of Acquired Spot Images: 0 COMPARISON:  None. FINDINGS: Initial barium swallows demonstrate normal pharyngeal motion with swallowing. No laryngeal penetration or  aspiration. No upper esophageal webs, strictures or diverticuli. Normal esophageal motility.  No mass or extrinsic compression. Small sliding-type hiatal hernia with mild narrowing at the GE junction with a lower esophageal mucosal ring. This is greater than 13 mm in diameter as the barium pill passed through this area without difficulty. No demonstrable GE reflux. IMPRESSION: 1. Normal esophageal motility. 2. Small sliding-type hiatal hernia and lower esophageal mucosal ring which is greater than 13 mm. 3. No demonstrable GE reflux. Electronically Signed   By: Rudie Meyer M.D.   On: 03/10/2016 14:01      Subjective: - no chest pain, shortness of breath, no abdominal pain, nausea or vomiting.   Discharge Exam: Vitals:   03/10/16 1028 03/10/16 1120  BP: (!) 144/78 120/70  Pulse: 93 68  Resp:  18  Temp: 98 F (36.7 C) 98.4 F (36.9 C)   Vitals:   03/09/16 2124 03/10/16 0559 03/10/16 1028 03/10/16 1120  BP: 136/66 106/71 (!) 144/78 120/70  Pulse: 82 76 93 68  Resp: 18 18  18   Temp: 98.4 F (36.9 C) 97.6 F (36.4 C) 98 F (36.7 C) 98.4 F (36.9 C)  TempSrc: Oral Oral Oral Oral  SpO2: 100% 100% 100% 99%  Weight:  70.8 kg (156 lb)    Height:        General: Pt is alert, awake, not in acute distress Cardiovascular: RRR, S1/S2 +, no rubs, no gallops Respiratory: CTA bilaterally, no wheezing, no rhonchi Abdominal: Soft, NT, ND, bowel sounds + Extremities: no edema, no cyanosis    The results of significant diagnostics from this hospitalization (including imaging, microbiology, ancillary and laboratory) are listed below for reference.     Microbiology: No results found for this or any previous visit (from the past 240 hour(s)).   Labs: BNP (last 3 results) No results for input(s): BNP in the last 8760 hours. Basic Metabolic Panel:  Recent Labs Lab 03/06/16 1352 03/08/16 1153 03/09/16 0335 03/10/16 0719  NA 133* 142 144 137  K 3.5 3.5 3.7 3.9  CL 101 110 113* 108    CO2 24 24 24 23   GLUCOSE 107* 104* 98 93  BUN 8 7 7 7   CREATININE 0.71 1.02* 0.75 0.61  CALCIUM 8.7* 9.1 8.7* 8.6*  MG  --   --  2.1  --    Liver Function Tests:  Recent Labs Lab 03/08/16 1153  AST 18  ALT 14  ALKPHOS 45  BILITOT 0.6  PROT 6.2*  ALBUMIN 3.9   No results for input(s): LIPASE, AMYLASE in the last 168 hours. No results for input(s): AMMONIA in the last 168 hours. CBC:  Recent Labs Lab 03/06/16 1352 03/08/16 1153 03/10/16 0719  WBC 5.9 7.2 5.2  NEUTROABS  --  5.1  --   HGB 12.9 13.0 11.5*  HCT 37.5 36.3 33.7*  MCV 87.4 86.2 87.3  PLT 198 184 171   Cardiac Enzymes: No results for input(s): CKTOTAL, CKMB, CKMBINDEX, TROPONINI in the last 168 hours. BNP: Invalid input(s): POCBNP CBG:  Recent Labs Lab 03/09/16 0850  GLUCAP 85   D-Dimer  Recent Labs  03/08/16 1153  DDIMER 0.40   Hgb A1c No results for input(s): HGBA1C in the last 72 hours. Lipid  Profile No results for input(s): CHOL, HDL, LDLCALC, TRIG, CHOLHDL, LDLDIRECT in the last 72 hours. Thyroid function studies  Recent Labs  03/09/16 0335  TSH 14.078*   Anemia work up  Recent Labs  03/09/16 0335  VITAMINB12 130*   Urinalysis    Component Value Date/Time   COLORURINE YELLOW 03/08/2016 1121   APPEARANCEUR CLEAR 03/08/2016 1121   LABSPEC 1.006 03/08/2016 1121   PHURINE 7.5 03/08/2016 1121   GLUCOSEU NEGATIVE 03/08/2016 1121   HGBUR NEGATIVE 03/08/2016 1121   BILIRUBINUR NEGATIVE 03/08/2016 1121   KETONESUR NEGATIVE 03/08/2016 1121   PROTEINUR NEGATIVE 03/08/2016 1121   UROBILINOGEN 0.2 02/16/2011 1656   NITRITE NEGATIVE 03/08/2016 1121   LEUKOCYTESUR TRACE (A) 03/08/2016 1121   Sepsis Labs Invalid input(s): PROCALCITONIN,  WBC,  LACTICIDVEN Microbiology No results found for this or any previous visit (from the past 240 hour(s)).   Time coordinating discharge: Over 30 minutes  SIGNED:  Pamella Pert, MD  [facility] 03/10/2016, 3:33 PM Pager  [phone]  If 7PM-7AM, please contact night-coverage [email or web address] Password [email or web address]

## 2016-03-10 NOTE — Progress Notes (Signed)
[facility] Progress [facility]  Heather Carey 65 y.o. [birth year]  CC:  Dysphagia, syncope   Subjective: Patient had episode of syncope in the radiology department while she was down for a barium swallow yesterday. Denied any symptoms today.  ROS : Denied abdominal pain, nausea, vomiting   Objective: Vital signs in last 24 hours: Vitals:   03/09/16 2124 03/10/16 0559  BP: 136/66 106/71  Pulse: 82 76  Resp: 18 18  Temp: 98.4 F (36.9 C) 97.6 F (36.4 C)    Physical Exam:  General:   Alert,  Well-developed, well-nourished, pleasant and cooperative in NAD HEENT-normocephalic/atraumatic  no icterus Lungs:  Clear throughout to auscultation.   No wheezes, crackles, or rhonchi. No acute distress. Heart:  Regular rate and rhythm; no murmurs, clicks, rubs,  or gallops. Abdomen: Soft, nontender, nondistended. Also present Lower ext-no edema    Lab Results:  Recent Labs  03/09/16 0335 03/10/16 0719  NA 144 137  K 3.7 3.9  CL 113* 108  CO2 24 23  GLUCOSE 98 93  BUN 7 7  CREATININE 0.75 0.61  CALCIUM 8.7* 8.6*  MG 2.1  --     Recent Labs  03/08/16 1153  AST 18  ALT 14  ALKPHOS 45  BILITOT 0.6  PROT 6.2*  ALBUMIN 3.9    Recent Labs  03/08/16 1153 03/10/16 0719  WBC 7.2 5.2  NEUTROABS 5.1  --   HGB 13.0 11.5*  HCT 36.3 33.7*  MCV 86.2 87.3  PLT 184 171   No results for input(s): LABPROT, INR in the last 72 hours.    Assessment/Plan: Dysphagia. Both esophageal and oropharyngeal. History of Schatzki's ring in 2008 Weight loss more than 50 pounds in last 1 year secondary to diet modification Syncope  Recommendation ------------------------ - Follow barium swallow and speech eval. - Given ongoing syncope, no plan for inpatient endoscopy workup. - Follow up with Dr. [name] after discharge. - GI will follow.   Kathi DerParag Airyana Sprunger MD, FACP 03/10/2016, 9:23 AM  Pager [phone]  If no answer or after 5 PM call [phone]

## 2016-03-10 NOTE — Progress Notes (Signed)
Reviewed chart and note plans for d/c this pm. Discussed with MD who reported that patient does not require SLP evaluation at this time. Order deferred. Please re consult if needed.   Ferdinand LangoLeah Ikeem Cleckler MA, CCC-SLP [phone]

## 2016-03-11 LAB — ACTH: C206 ACTH: 13.6 pg/mL (ref 7.2–63.3)

## 2016-03-11 LAB — B. BURGDORFI ANTIBODIES: B burgdorferi Ab IgG+IgM: <0.91 {ISR} (ref 0.00–0.90)

## 2016-03-15 ENCOUNTER — Telehealth: Payer: Self-pay

## 2016-03-15 NOTE — Telephone Encounter (Signed)
SEND NOTES TO SCHEDULING 

## 2016-03-17 ENCOUNTER — Encounter: Payer: Self-pay | Admitting: Physician Assistant

## 2016-03-17 NOTE — Progress Notes (Signed)
Cardiology Office Note    Date:  03/18/2016  ID:  Heather Carey, DOB [birth year], MRN [id number] PCP:  [name], MD (Inactive)  Cardiologist:  Dr. 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 From cardiac standpoint, last seen by Dr. Mayford Knife in 2015 for atypical chest pain and snoring. 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  2D Echo 02/2014: EF 55-60%, no RWMA, normal diastolic parameters, mild-mod MR, mild-mod TR, mild LAE. Nuc 02/2014: normal, EF 74%. Per Dr. Mayford Knife she has h/o Holter with no arrhythmias.   She was admitted 02/2016 with 2 episodes of syncope. The week before admission she would notice a sharp pain occurring both in her face and in her right upper arm that would come and go for a second at the same time. She was then seen in the emergency department on 03/06/2016 with a syncopal episode that occurred after standing up from a seated position. This was preceded with a prodrome of her hands and legs feeling ice cold. When she woke up, she felt like her legs were like "jelly." The patient had MRI of the brain, MRA of the neck and brain which were all unremarkable. She was discharged in stable condition. She returned 03/08/16 with another episode - on the morning of 03/08/2016, the patient was walking into the den (after she had already been standing) when she felt the same cold sensation again. The next thing she knew she was laying on the floor. Her mother's CNA helped her seek medical attention. In the emergency department, the patient was afebrile and  hemodynamically stable and saturating well on room air. Her syncope was felt secondary to volume depletion and vagal reaction. She received IVF with resolution of her symptoms, EEG was negative. Her cortisol was found to be borderline ~5 and she underwent a stim test which was within normal limits. She was found to have significant B12 deficiency which would likely explanation for her paresthesias and periphery sensory deficits. She stated that she had an allergic reaction to B12 injectable forms and thus was placed on high dose oral supplements which she tolerated well. She was also evaluated for dysphagia with nonacute barium swallow, elevated TSH (in setting of forgetting thyroid medication occasionally), weight loss (very restrictive diet with esophagitis), and multiple allergies with chronic urticaria. Labs notable otherwise for K 3.9, Cr 0.61, Hgb 11-13, d-dimer neg, troponin neg.  She presents today for follow-up of the above. Since discharge she's noticed occasional recurrences of her hands and feet suddenly feeling ice cold, followed by feeling weak and wobbly. She feels like she needs to sit down, which does help symptoms somewhat. She occasionally has to put her legs up in the air. She has not had any full recurrent syncope. She stopped taking her Toprol since her blood pressure has tended to run lower. Orthostatic blood pressures in clinic today showed no significant change in blood pressure with standing (104/60-> 108/70 -> 108/70 , HR 70 -> 82). She denies any palpitations, dyspnea, or chest pain.   Past Medical History:  Diagnosis Date  . Diverticulitis   . Eosinophilic  esophagitis   . Fibromyalgia   . GERD (gastroesophageal reflux disease)   . Hyperlipidemia   . Hypertension   . Hypothyroidism   . IBS (irritable bowel syndrome)   . Migraines   . Mitral regurgitation 02/28/2014   Mild to moderate by echo 02/2014  . Obesity   . Osteoarthritis   . Otitis externa   . Palpitations    . TIA (transient ischemic attack)     Past Surgical History:  Procedure Laterality Date  . ABDOMINAL HYSTERECTOMY    . carpel tunnel    . lbp gso    . TONSILLECTOMY AND ADENOIDECTOMY      Current Medications: Current Outpatient Prescriptions  Medication Sig Dispense Refill  . diphenhydrAMINE (BENADRYL) 25 mg capsule Take 25 mg by mouth every 6 (six) hours as needed for allergies.    Marland Kitchen. EPINEPHrine 0.3 mg/0.3 mL IJ SOAJ injection Inject 0.3 mLs (0.3 mg total) into the muscle once. 2 Device 2  . levothyroxine (SYNTHROID, LEVOTHROID) 137 MCG tablet Take 137 mcg by mouth daily before breakfast.    . vitamin B-12 1000 MCG tablet Take 1 tablet (1,000 mcg total) by mouth daily. 30 tablet 0  . metoprolol succinate (TOPROL-XL) 25 MG 24 hr tablet Take 12.5 mg by mouth daily.     No current facility-administered medications for this visit.      Allergies:   Azithromycin; Codeine; Eggs or egg-derived products; Ivp dye [iodinated diagnostic agents]; Rose; Shellfish allergy; Thimerosol [thimerosal]; Strawberry extract; Avelox [moxifloxacin hcl in nacl]; Bacitracin; Benzalkonium chloride; Cinnamon; Ginger; Onion; Other; Sunscreens; Watermelon [citrullus vulgaris]; Cephalosporins; and Penicillins   Social History   Social History  . Marital status: Married    Spouse name: N/A  . Number of children: N/A  . Years of education: N/A   Social History Main Topics  . Smoking status: Never Smoker  . Smokeless tobacco: Never Used  . Alcohol use 0.6 oz/week    1 Glasses of wine per week  . Drug use: No  . Sexual activity: Yes   Other Topics Concern  . None   Social History Narrative  . None     Family History:  The patient's family history includes Allergic rhinitis in her brother, father, mother, and sister; Angioedema in her sister; Asthma in her brother and father; Urticaria in her mother and sister.   ROS:   Please see the history of present illness.  All other systems are reviewed  and otherwise negative.    PHYSICAL EXAM:   VS:  BP 100/64   Pulse 68   Ht 5' 5.5" (1.664 m)   Wt 155 lb 12 oz (70.6 kg)   SpO2 97%   BMI 25.52 kg/m   BMI: Body mass index is 25.52 kg/m. GEN: Well nourished, well developed WF, in no acute distress  HEENT: normocephalic, atraumatic Neck: no JVD, carotid bruits, or masses Cardiac: RRR; no murmurs, rubs, or gallops, no edema  Respiratory:  clear to auscultation bilaterally, normal work of breathing GI: soft, nontender, nondistended, + BS MS: no deformity or atrophy  Skin: warm and dry. Prior rash noted where hospital electrodes were. Neuro:  Alert and Oriented x 3, Strength and sensation are intact, follows commands Psych: euthymic mood, full affect  Wt Readings from Last 3 Encounters:  03/18/16 155 lb 12 oz (70.6 kg)  03/10/16 156 lb (70.8 kg)  03/06/16 155 lb (70.3 kg)      Studies/Labs Reviewed:   EKG:  EKG was ordered today and personally  reviewed by me and demonstrates NSR 64bpm no acute ST-T changes, QRS 420ms.  Recent Labs: 03/08/2016: ALT 14 03/09/2016: Magnesium 2.1; TSH 14.078 03/10/2016: BUN 7; Creatinine, Ser 0.61; Hemoglobin 11.5; Platelets 171; Potassium 3.9; Sodium 137   Additional studies/ records that were reviewed today include: Summarized above.    ASSESSMENT & PLAN:   1. Syncope - etiology unclear, complex picture as above. So far this has been felt possibly due to volume depletion. Would recommend proceeding with 2D echocardiogram and 30 day event monitor to further evaluate from a cardiac perspective. Her sensory parasthesias may be related to severe B12 deficiency but will see if there is any correlation with arrhythmia on monitoring. She will need the sensitive skin electrodes. I asked her to discuss with the monitoring tech when her event monitor is applied. If she is unable to tolerate event monitoring (given h/o urticaria with hospital electrodes), we may need to consider referral to EP for ILR.  In the meantime will continue to hold beta blocker given borderline hypotension. I also reviewed with her the recommendation that she should not drive until cleared pending above workup. 2. Essential HTN - blood pressure tending to run low, but not orthostatic. She will continue to hold beta blocker. Midodrine would be a consideration if the above workup is unrevealing. 3. Mitral valve regurgitation  & tricuspid regurgitation - reassess with echocardiogram. 4. Dyslipidemia - will hold off on further evaluation for this at present so as not to complicate the overall picture.  Disposition: F/u with Dr. Mayford Knifeurner or team APP after event monitor.   Medication Adjustments/Labs and Tests Ordered: Current medicines are reviewed at length with the patient today.  Concerns regarding medicines are outlined above. Medication changes, Labs and Tests ordered today are summarized above and listed in the Patient Instructions accessible in Encounters.   Thomasene MohairSigned, Katoria Yetman PA-C  03/18/2016 11:49 AM    [facility] [address], [place], [place]  [address] Phone: [phone]; Fax: [phone]

## 2016-03-18 ENCOUNTER — Encounter: Payer: Self-pay | Admitting: Physician Assistant

## 2016-03-18 ENCOUNTER — Ambulatory Visit (INDEPENDENT_AMBULATORY_CARE_PROVIDER_SITE_OTHER): Payer: PRIVATE HEALTH INSURANCE | Admitting: Physician Assistant

## 2016-03-18 VITALS — BP 100/64 | HR 68 | Ht 65.5 in | Wt 155.8 lb

## 2016-03-18 DIAGNOSIS — I340 Nonrheumatic mitral (valve) insufficiency: Secondary | ICD-10-CM

## 2016-03-18 DIAGNOSIS — E785 Hyperlipidemia, unspecified: Secondary | ICD-10-CM | POA: Diagnosis not present

## 2016-03-18 DIAGNOSIS — R55 Syncope and collapse: Secondary | ICD-10-CM | POA: Diagnosis not present

## 2016-03-18 DIAGNOSIS — I071 Rheumatic tricuspid insufficiency: Secondary | ICD-10-CM

## 2016-03-18 DIAGNOSIS — I10 Essential (primary) hypertension: Secondary | ICD-10-CM

## 2016-03-18 NOTE — Patient Instructions (Addendum)
Medication Instructions:  Your physician recommends that you continue on your current medications as directed. Please refer to the Current Medication list given to you today.   Labwork: None ordered  Testing/Procedures: Your physician has requested that you have an echocardiogram. Echocardiography is a painless test that uses sound waves to create images of your heart. It provides your doctor with information about the size and shape of your heart and how well your heart's chambers and valves are working. This procedure takes approximately one hour. There are no restrictions for this procedure.  Your physician has recommended that you wear an event monitor. Event monitors are medical devices that record the heart's electrical activity. Doctors most often us these monitors to diagnose arrhythmias. Arrhythmias are problems with the speed or rhythm of the heartbeat. The monitor is a small, portable device. You can wear one while you do your normal daily activities. This is usually used to diagnose what is causing palpitations/syncope (passing out).    Follow-Up: Your physician recommends that you schedule a follow-up appointment in: 5 WEEKS WITH DR. Dietrich PatesURNER, BRITTANY SIMMONS, PA-C, OR DAYNA DUNN, PA-C  Any Other Special Instructions Will Be Listed Below (If Applicable).  Echocardiogram An echocardiogram, or echocardiography, uses sound waves (ultrasound) to produce an image of your heart. The echocardiogram is simple, painless, obtained within a short period of time, and offers valuable information to your health care provider. The images from an echocardiogram can provide information such as:  Evidence of coronary artery disease (CAD).  Heart size.  Heart muscle function.  Heart valve function.  Aneurysm detection.  Evidence of a past heart attack.  Fluid buildup around the heart.  Heart muscle thickening.  Assess heart valve function. Tell a health care provider about:  Any  allergies you have.  All medicines you are taking, including vitamins, herbs, eye drops, creams, and over-the-counter medicines.  Any problems you or family members have had with anesthetic medicines.  Any blood disorders you have.  Any surgeries you have had.  Any medical conditions you have.  Whether you are pregnant or may be pregnant. What happens before the procedure? No special preparation is needed. Eat and drink normally. What happens during the procedure?  In order to produce an image of your heart, gel will be applied to your chest and a wand-like tool (transducer) will be moved over your chest. The gel will help transmit the sound waves from the transducer. The sound waves will harmlessly bounce off your heart to allow the heart images to be captured in real-time motion. These images will then be recorded.  You may need an IV to receive a medicine that improves the quality of the pictures. What happens after the procedure? You may return to your normal schedule including diet, activities, and medicines, unless your health care provider tells you otherwise. This information is not intended to replace advice given to you by your health care provider. Make sure you discuss any questions you have with your health care provider. Document Released: 03/25/2000 Document Revised: 11/14/2015 Document Reviewed: 12/03/2012 Elsevier Interactive Patient Education  2017 Elsevier Inc.     Cardiac Event Monitoring A cardiac event monitor is a small recording device used to help detect abnormal heart rhythms (arrhythmias). The monitor is used to record heart rhythm when noticeable symptoms such as the following occur:  Fast heartbeats (palpitations), such as heart racing or fluttering.  Dizziness.  Fainting or light-headedness.  Unexplained weakness. The monitor is wired to two electrodes placed on  your chest. Electrodes are flat, sticky disks that attach to your skin. The monitor  can be worn for up to 30 days. You will wear the monitor at all times, except when bathing. How to use your cardiac event monitor A technician will prepare your chest for the electrode placement. The technician will show you how to place the electrodes, how to work the monitor, and how to replace the batteries. Take time to practice using the monitor before you leave the office. Make sure you understand how to send the information from the monitor to your health care provider. This requires a telephone with a landline, not a cell phone. You need to:  Wear your monitor at all times, except when you are in water:  Do not get the monitor wet.  Take the monitor off when bathing. Do not swim or use a hot tub with it on.  Keep your skin clean. Do not put body lotion or moisturizer on your chest.  Change the electrodes daily or any time they stop sticking to your skin. You might need to use tape to keep them on.  It is possible that your skin under the electrodes could become irritated. To keep this from happening, try to put the electrodes in slightly different places on your chest. However, they must remain in the area under your left breast and in the upper right section of your chest.  Make sure the monitor is safely clipped to your clothing or in a location close to your body that your health care provider recommends.  Press the button to record when you feel symptoms of heart trouble, such as dizziness, weakness, light-headedness, palpitations, thumping, shortness of breath, unexplained weakness, or a fluttering or racing heart. The monitor is always on and records what happened slightly before you pressed the button, so do not worry about being too late to get good information.  Keep a diary of your activities, such as walking, doing chores, and taking medicine. It is especially important to note what you were doing when you pushed the button to record your symptoms. This will help your health  care provider determine what might be contributing to your symptoms. The information stored in your monitor will be reviewed by your health care provider alongside your diary entries.  Send the recorded information as recommended by your health care provider. It is important to understand that it will take some time for your health care provider to process the results.  Change the batteries as recommended by your health care provider. Get help right away if:  You have chest pain.  You have extreme difficulty breathing or shortness of breath.  You develop a very fast heartbeat that persists.  You develop dizziness that does not go away.  You faint or constantly feel you are about to faint. This information is not intended to replace advice given to you by your health care provider. Make sure you discuss any questions you have with your health care provider. Document Released: 01/05/2008 Document Revised: 09/03/2015 Document Reviewed: 09/24/2012 Elsevier Interactive Patient Education  2017 [employer] If you need a refill on your cardiac medications before your next appointment, please call your pharmacy.

## 2016-03-21 ENCOUNTER — Ambulatory Visit (HOSPITAL_COMMUNITY): Payer: PRIVATE HEALTH INSURANCE | Attending: Cardiology

## 2016-03-21 ENCOUNTER — Other Ambulatory Visit: Payer: Self-pay

## 2016-03-21 DIAGNOSIS — R55 Syncope and collapse: Secondary | ICD-10-CM | POA: Insufficient documentation

## 2016-03-21 DIAGNOSIS — I081 Rheumatic disorders of both mitral and tricuspid valves: Secondary | ICD-10-CM | POA: Insufficient documentation

## 2016-03-21 LAB — ECHOCARDIOGRAM COMPLETE
Ao-asc: 32 cm
E decel time: 292 msec
E/e' ratio: 7.67
FS: 29 % (ref 28–44)
IVS/LV PW RATIO, ED: 1.16
LA ID, A-P, ES: 29 mm
LA diam end sys: 29 mm
LA diam index: 1.62 cm/m2
LA vol A4C: 63 ml
LA vol index: 38 mL/m2
LA vol: 68 mL
LV E/e' medial: 7.67
LV E/e'average: 7.67
LV PW d: 8.3 mm — AB (ref 0.6–1.1)
LV dias vol index: 41 mL/m2
LV dias vol: 74 mL (ref 46–106)
LV e' LATERAL: 11.2 cm/s
LV sys vol index: 16 mL/m2
LV sys vol: 28 mL (ref 14–42)
LVOT SV: 67 mL
LVOT VTI: 21.4 cm
LVOT area: 3.14 cm2
LVOT diameter: 20 mm
LVOT peak vel: 88.7 cm/s
Lateral S' vel: 13.1 cm/s
MV Dec: 292
MV Peak grad: 3 mmHg
MV pk A vel: 59.2 m/s
MV pk E vel: 85.9 m/s
RV sys press: 26 mmHg
Reg peak vel: 240 cm/s
Simpson's disk: 62
Stroke v: 46 ml
TDI e' lateral: 11.2
TDI e' medial: 10.6
TR max vel: 240 cm/s

## 2016-03-21 NOTE — Progress Notes (Unsigned)
[id number] Heather BouillonEmmett

## 2016-03-23 ENCOUNTER — Encounter: Payer: Self-pay | Admitting: Physician Assistant

## 2016-03-24 ENCOUNTER — Encounter: Payer: Self-pay | Admitting: Physician Assistant

## 2016-03-28 ENCOUNTER — Ambulatory Visit (INDEPENDENT_AMBULATORY_CARE_PROVIDER_SITE_OTHER): Payer: PRIVATE HEALTH INSURANCE

## 2016-03-28 DIAGNOSIS — R55 Syncope and collapse: Secondary | ICD-10-CM | POA: Diagnosis not present

## 2016-04-14 ENCOUNTER — Telehealth: Payer: Self-pay | Admitting: *Deleted

## 2016-04-14 ENCOUNTER — Telehealth: Payer: Self-pay | Admitting: Physician Assistant

## 2016-04-14 NOTE — Telephone Encounter (Signed)
New Message   Having allergic reaction to nodes for heart monitor, she has had them changed out twice and still breaking out in hives.

## 2016-04-14 NOTE — Telephone Encounter (Signed)
Patient has had allergic reaction to 3 different types of electrodes from [employer].  They have one additional alternative.  Pulled up recordings from event monitor, which, included 3 events.  One event had a 4 beat ventricular run.  Instructed patient to remove current monitor, contact [employer] for the 4th alternative.  It should take approximately 3 days to receive  Small electrodes and connector which does not adhere to skin.  Once the electrodes arrive, she can apply under left breast until upper chest heals.  If this does not work, please contact our office.  I have 3 alternative electrodes that can be tried to attach to connector.  Patient was appreciative of call.

## 2016-04-28 ENCOUNTER — Encounter: Payer: Self-pay | Admitting: Physician Assistant

## 2016-05-02 ENCOUNTER — Ambulatory Visit: Payer: PRIVATE HEALTH INSURANCE | Admitting: Physician Assistant

## 2016-05-12 ENCOUNTER — Ambulatory Visit: Payer: PRIVATE HEALTH INSURANCE | Admitting: Physician Assistant

## 2016-05-12 ENCOUNTER — Institutional Professional Consult (permissible substitution): Payer: PRIVATE HEALTH INSURANCE | Admitting: Internal Medicine

## 2016-05-17 ENCOUNTER — Telehealth: Payer: Self-pay | Admitting: Internal Medicine

## 2016-05-17 ENCOUNTER — Encounter: Payer: Self-pay | Admitting: Nurse Practitioner

## 2016-05-17 ENCOUNTER — Ambulatory Visit (INDEPENDENT_AMBULATORY_CARE_PROVIDER_SITE_OTHER): Payer: PRIVATE HEALTH INSURANCE | Admitting: Nurse Practitioner

## 2016-05-17 VITALS — BP 120/80 | HR 98 | Ht 65.5 in | Wt 156.0 lb

## 2016-05-17 DIAGNOSIS — R002 Palpitations: Secondary | ICD-10-CM | POA: Diagnosis not present

## 2016-05-17 LAB — CBC
Hematocrit: 35.8 % (ref 34.0–46.6)
Hemoglobin: 12.1 g/dL (ref 11.1–15.9)
MCH: 30.2 pg (ref 26.6–33.0)
MCHC: 33.8 g/dL (ref 31.5–35.7)
MCV: 89 fL (ref 79–97)
Platelets: 216 10*3/uL (ref 150–379)
RBC: 4.01 x10E6/uL (ref 3.77–5.28)
RDW: 13.8 % (ref 12.3–15.4)
WBC: 7 10*3/uL (ref 3.4–10.8)

## 2016-05-17 LAB — BASIC METABOLIC PANEL
BUN/Creatinine Ratio: 15 (ref 12–28)
BUN: 11 mg/dL (ref 8–27)
CO2: 22 mmol/L (ref 18–29)
Calcium: 9 mg/dL (ref 8.7–10.3)
Chloride: 103 mmol/L (ref 96–106)
Creatinine, Ser: 0.75 mg/dL (ref 0.57–1.00)
GFR calc Af Amer: 98 mL/min/{1.73_m2} (ref >59)
GFR calc non Af Amer: 85 mL/min/{1.73_m2} (ref >59)
Glucose: 87 mg/dL (ref 65–99)
Potassium: 3.7 mmol/L (ref 3.5–5.2)
Sodium: 141 mmol/L (ref 134–144)

## 2016-05-17 MED ORDER — METOPROLOL SUCCINATE ER 25 MG PO TB24: 25.0000 mg | ORAL_TABLET | Freq: Every day | ORAL | Status: DC

## 2016-05-17 NOTE — Telephone Encounter (Signed)
New Message     Pt has had the flu for a week, and sh

## 2016-05-17 NOTE — Progress Notes (Signed)
CARDIOLOGY OFFICE NOTE  Date:  05/17/2016    Heather Carey Date of Birth: 03/22/1951 Medical Record #[id number]  PCP:  [name], MD  Cardiologist:  Mayford Knife  Chief Complaint  Patient presents with  . Palpitations    Work in visit - seen for Dr. Mayford Knife    History of Present Illness: Heather Carey is a 66 y.o. female who presents today for a work in visit. Seen for Dr. Mayford Knife.   She is a former [employer] with history of atypical chest pain (normal prior stress testing), eosinophilic esophagitis, chronic urticaria/multiple allergies (followed at [facility]), HTN, fibromyalgia, dyslipidemia, obesity, migraines, TIA, & palpitations.   Last seen by Dr. Mayford Knife in 2015 for atypical chest pain and snoring. 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  2D Echo 02/2014: EF 55-60%, no RWMA, normal diastolic parameters, mild-mod MR, mild-mod TR, mild LAE. Nuc 02/2014: normal, EF 74%. Per Dr. Mayford Knife she has h/o Holter with no arrhythmias.   She was admitted 02/2016 with 2 episodes of syncope. The week before admission she would notice a sharp pain occurring both in her face and in her right upper arm that would come and go for a second at the same time. She was then seen in the emergency department on 03/06/2016 with a syncopal episode that occurred after standing up from a seated position. This was preceded with a prodrome of her hands and legs feeling ice cold. When she woke up, she felt like her legs were like "jelly." The patient had MRI of the brain, MRA of the neck and brain which were all unremarkable. She was discharged in stable condition. She returned 03/08/16 with another episode - on the morning of 03/08/2016, the patient was walking into the den (after she had already been standing) when she felt the same cold sensation again. The next thing she knew she was laying on the floor. Her  mother's CNA helped her seek medical attention. In the emergency department, the patient was afebrile and hemodynamically stable and saturating well on room air. Her syncope was felt secondary to volume depletion and vagal reaction. She received IVF with resolution of her symptoms, EEG was negative. Her cortisol was found to be borderline ~5 and she underwent a stim test which was within normal limits. She was found to have significant B12 deficiency which would likely explanation for her paresthesias and periphery sensory deficits. She stated that she had an allergic reaction to B12 injectable forms and thus was placed on high dose oral supplements which she tolerated well. She was also evaluated for dysphagia with nonacute barium swallow, elevated TSH (in setting of forgetting thyroid medication occasionally), weight loss (very restrictive diet with esophagitis), and multiple allergies with chronic urticaria. Labs notable otherwise for K 3.9, Cr 0.61, Hgb 11-13, d-dimer neg, troponin neg.  Seen back for follow up in December by Ronie Spies, PA - she was still having lots of symptoms but no frank syncope. Toprol stopped due to low BP.  Event monitor was placed - but horribly allergic to the electrodes despite numerous one tried.  Was being referred to Dr. Graciela Husbands for consideration of loop implant. Looks that appointment was cancelled.  Comes in today. Here alone. Multiple issues. She had some type of flu/viral illness last week. This has improved but was associated with upper respiratory symptoms and fever. By  Sunday, started having irregular heart beating that "just won't stop". She feels very jittery. It makes her short of breath. Has had a sharp pain up her left side of her neck. Feels tingly "all over". No longer on her Toprol due to low BP.   Past Medical History:  Diagnosis Date  . Diverticulitis   . Eosinophilic esophagitis   . Fibromyalgia   . GERD (gastroesophageal reflux disease)   .  Hyperlipidemia   . Hypertension   . Hypothyroidism   . IBS (irritable bowel syndrome)   . Migraines   . Mitral regurgitation 02/28/2014   Mild to moderate by echo 02/2014  . Obesity   . Osteoarthritis   . Otitis externa   . Palpitations   . TIA (transient ischemic attack)     Past Surgical History:  Procedure Laterality Date  . ABDOMINAL HYSTERECTOMY    . carpel tunnel    . lbp gso    . TONSILLECTOMY AND ADENOIDECTOMY       Medications: Current Outpatient Prescriptions  Medication Sig Dispense Refill  . diphenhydrAMINE (BENADRYL) 25 mg capsule Take 25 mg by mouth every 6 (six) hours as needed for allergies.    Marland Kitchen EPINEPHrine 0.3 mg/0.3 mL IJ SOAJ injection Inject 0.3 mLs (0.3 mg total) into the muscle once. 2 Device 2  . levothyroxine (SYNTHROID, LEVOTHROID) 137 MCG tablet Take 137 mcg by mouth daily before breakfast.    . vitamin B-12 1000 MCG tablet Take 1 tablet (1,000 mcg total) by mouth daily. 30 tablet 0  . metoprolol succinate (TOPROL XL) 25 MG 24 hr tablet Take 1 tablet (25 mg total) by mouth daily.     No current facility-administered medications for this visit.     Allergies: Allergies  Allergen Reactions  . Azithromycin Anaphylaxis  . Codeine Shortness Of Breath  . Eggs Or Egg-Derived Products     ITCHING AND SWELLING OF LIPS  . Ivp Dye [Iodinated Diagnostic Agents] Anaphylaxis  . Rose Hives  . Shellfish Allergy Anaphylaxis  . Thimerosol [Thimerosal] Anaphylaxis  . Strawberry Extract Swelling    Pt reports throat swelling and itching with any type of berry  . Avelox [Moxifloxacin Hcl In Nacl]     Irregular heart beat  . Bacitracin Itching and Other (See Comments)    Blisters on skin   . Benzalkonium Chloride Other (See Comments)  . Cinnamon Swelling  . Ginger Swelling  . Onion     MOUTH SWELLING  . Other Swelling    Nuts and ginger    . Sunscreens Swelling  . Watermelon [Citrullus Vulgaris]     ITCHY MOUTH, LIP SWELLING  . Cephalosporins Rash    . Penicillins Hives    Social History: The patient  reports that she has never smoked. She has never used smokeless tobacco. She reports that she drinks about 0.6 oz of alcohol per week . She reports that she does not use drugs.   Family History: The patient's family history includes Allergic rhinitis in her brother, father, mother, and sister; Angioedema in her sister; Asthma in her brother and father; Urticaria in her mother and sister.   Review of Systems: Please see the history of present illness.   Otherwise, the review of systems is positive for none.   All other systems are reviewed and negative.   Physical Exam: VS:  BP 120/80   Pulse 98   Ht 5' 5.5" (1.664 m)   Wt 156 lb (70.8 kg)   BMI 25.56 kg/m  .  BMI Body mass index is 25.56 kg/m.  Wt Readings from Last 3 Encounters:  05/17/16 156 lb (70.8 kg)  03/18/16 155 lb 12 oz (70.6 kg)  03/10/16 156 lb (70.8 kg)    General: Seems anxious. She is purse lip breathing for the entire visit. She is alert and does not appear to be in no acute distress.   HEENT: Normal.  Neck: Supple, no JVD, carotid bruits, or masses noted.  Cardiac: Regular rate and rhythm. Rare ectopic.  No edema.  Respiratory:  Lungs are clear to auscultation bilaterally with normal work of breathing.  GI: Soft and nontender.  MS: No deformity or atrophy. Gait and ROM intact.  Skin: Warm and dry. Color is normal.  Neuro:  Strength and sensation are intact and no gross focal deficits noted.  Psych: Alert, appropriate and with normal affect.   LABORATORY DATA:  EKG:  EKG is ordered today. This demonstrates NSR - her rate is faster today - reviewed with Dr. Elberta Fortis.  Lab Results  Component Value Date   WBC 5.2 03/10/2016   HGB 11.5 (L) 03/10/2016   HCT 33.7 (L) 03/10/2016   PLT 171 03/10/2016   GLUCOSE 93 03/10/2016   CHOL (H) 03/11/2009    227        ATP III CLASSIFICATION:  <200     mg/dL   Desirable  161-096  mg/dL   Borderline High  >=045     mg/dL   High          TRIG 409 (H) 03/11/2009   HDL 31 (L) 03/11/2009   LDLCALC (H) 03/11/2009    156        Total Cholesterol/HDL:CHD Risk Coronary Heart Disease Risk Table                     Men   Women  1/2 Average Risk   3.4   3.3  Average Risk       5.0   4.4  2 X Average Risk   9.6   7.1  3 X Average Risk  23.4   11.0        Use the calculated Patient Ratio above and the CHD Risk Table to determine the patient's CHD Risk.        ATP III CLASSIFICATION (LDL):  <100     mg/dL   Optimal  811-914  mg/dL   Near or Above                    Optimal  130-159  mg/dL   Borderline  782-956  mg/dL   High  >213     mg/dL   Very High   ALT 14 08/65/7846   AST 18 03/08/2016   NA 137 03/10/2016   K 3.9 03/10/2016   CL 108 03/10/2016   CREATININE 0.61 03/10/2016   BUN 7 03/10/2016   CO2 23 03/10/2016   TSH 14.078 (H) 03/09/2016   HGBA1C 6.0 (H) 02/10/2014    BNP (last 3 results) No results for input(s): BNP in the last 8760 hours.  ProBNP (last 3 results) No results for input(s): PROBNP in the last 8760 hours.   Other Studies Reviewed Today:  Notes Recorded by Quintella Reichert, MD on 04/29/2016 at 1:47 PM EST Heart monitor showed NSR with nonsustained atrial tachycardia and 4 beats of WCT - please refer to EP for possible ILR since she has had 2 syncopal episodes. Not clear on etiology  Echo Study Conclusions from 03/2016  - Left ventricle: The cavity size was normal. Systolic function was   normal. The estimated ejection fraction was in the range of 55%   to 60%. Wall motion was normal; there were no regional wall   motion abnormalities. The pulmonary vein flow pattern was normal.   The tissue Doppler parameters were normal. Left ventricular   diastolic function parameters were normal. - Aortic valve: There was no regurgitation. - Aortic root: The aortic root was normal in size. - Mitral valve: Structurally normal valve. There was mild   regurgitation. - Left  atrium: The atrium was at the upper limits of normal in   size. - Right ventricle: The cavity size was normal. Wall thickness was   normal. Systolic function was normal. - Right atrium: The atrium was normal in size. - Tricuspid valve: There was mild regurgitation. - Pulmonary arteries: Systolic pressure was within the normal   range. - Inferior vena cava: The vessel was normal in size. - Pericardium, extracardiac: There was no pericardial effusion.   Assessment/Plan: 1. Syncope - etiology unclear, complex picture as above. This was felt to be due to volume depletion. She has had basically normal echo. Event monitor noted - was to see EP but cancelled. Now with recurrent palpitations - discussed with Dr. Elberta Fortisamnitz (DOD) - will place back on her Toprol - try to take full tablet for the next few days - needs to increase her salt to keep BP up - will check labs today and get her a visit with Dr. Graciela HusbandsKlein rescheduled for consideration of loop recorder.  2. Essential HTN - adding back Toprol today to help with symptoms. She is to monitor - I have asked her to liberalize her salt for now.  3. Mitral valve regurgitation  & tricuspid regurgitation - mild by most recent echo 4. Dyslipidemia - will continue to hold off on further evaluation for this at present so as not to complicate the overall picture.  Current medicines are reviewed with the patient today.  The patient does not have concerns regarding medicines other than what has been noted above.  The following changes have been made:  See above.  Labs/ tests ordered today include:    Orders Placed This Encounter  Procedures  . Basic metabolic panel  . CBC  . Ambulatory referral to Cardiac Electrophysiology  . EKG 12-Lead     Disposition:   Further disposition pending.   Patient is agreeable to this plan and will call if any problems develop in the interim.   SignedNorma Fredrickson: Tyrese Ficek, NP  05/17/2016 10:08 AM  [facility] [address] [place], [place]  [address] Phone: [phone]) [phone] Fax: [phone]

## 2016-05-17 NOTE — Patient Instructions (Addendum)
We will be checking the following labs today - BMET and CBC   Medication Instructions:    Continue with your current medicines. BUT  I am adding back the Toprol XL 25 mg daily - try to take the whole tablet for the next few days and then may cut back to just 1/2 tablet     Testing/Procedures To Be Arranged:  N/A  Follow-Up:   Will get your visit with Dr. Graciela HusbandsKlein arranged.     Other Special Instructions:   Increase your salt intake to keep your blood pressure up.     If you need a refill on your cardiac medications before your next appointment, please call your pharmacy.   Call the [facility] office at [phone] if you have any questions, problems or concerns.

## 2016-06-06 ENCOUNTER — Encounter: Payer: Self-pay | Admitting: Internal Medicine

## 2016-06-06 ENCOUNTER — Ambulatory Visit (INDEPENDENT_AMBULATORY_CARE_PROVIDER_SITE_OTHER): Payer: PRIVATE HEALTH INSURANCE | Admitting: Internal Medicine

## 2016-06-06 VITALS — BP 121/76 | HR 70 | Ht 65.0 in | Wt 158.0 lb

## 2016-06-06 DIAGNOSIS — R55 Syncope and collapse: Secondary | ICD-10-CM | POA: Diagnosis not present

## 2016-06-06 DIAGNOSIS — R002 Palpitations: Secondary | ICD-10-CM | POA: Diagnosis not present

## 2016-06-06 MED ORDER — METOPROLOL SUCCINATE ER 25 MG PO TB24: 12.5000 mg | ORAL_TABLET | Freq: Every day | ORAL | 6 refills | Status: DC

## 2016-06-06 NOTE — Progress Notes (Signed)
ELECTROPHYSIOLOGY CONSULT NOTE  Patient ID: Heather Carey, MRN: [id number], DOB/AGE: 1950/08/28 66 y.o. Admit date: (Not on file) Date of Consult: 06/06/2016  Primary Physician: [name], MD Primary Cardiologist: TT Consulting Physician TT  Chief Complaint: spells   HPI Heather Carey is a 66 y.o. female  Seen for recurrent syncope.  She has had 2 spells and multiple episodes of presyncope. They have been stereo typical. It occurred in a broad temporal context of a pain that occur simultaneously in her right cheek and her right arm. These are not closely related in time.  She then gets a sensation of cold hands and cold feet area this lasts for a minute or 2. There is no warmth or flushing. She then has lost consciousness.  On one of these occasions there were irregular tachypalpitations that preceded. She is not sure as to whether these have been present with previous episodes.  She has had other episodes of irregular tachypalpitations associated with presyncope. She describes these as flutters and jumping around in her chest.  I reviewed hospital telemetry. There were a few episodes of nonsustained atrial tachycardia recorded.  She also describes episodes where she gets abruptly short of breath and that she will "hit by a 2 x 4 " there is no associated pain;  just an abrupt loss of breath.  She had an episode in 2012 where she had transient right-sided hemiparesis. With an antecedent history of migraines, there is some question as to whether this was an acephalic migraine. However, there is also concerned that it might have been a TIA. In the wake of that she had something that was called "Frankenstein feet "where she found herself unable to walk.  Seen in neurologist, there were some concern that this was residual  affect from a stroke.  Neuro evaluation with an EEG MRI and MRA were all normal. She has been found to have B-12 deficiency.  She has a history of a bat bite  for which she received rabies injections. It was in the wake of these that she developed significant allergies resulting in urticaria. Her aforementioned episodes were not associated with this.    Past Medical History:  Diagnosis Date  . Diverticulitis   . Eosinophilic esophagitis   . Fibromyalgia   . GERD (gastroesophageal reflux disease)   . Hyperlipidemia   . Hypertension   . Hypothyroidism   . IBS (irritable bowel syndrome)   . Migraines   . Mitral regurgitation 02/28/2014   Mild to moderate by echo 02/2014  . Obesity   . Osteoarthritis   . Otitis externa   . Palpitations   . TIA (transient ischemic attack)       Surgical History:  Past Surgical History:  Procedure Laterality Date  . ABDOMINAL HYSTERECTOMY    . carpel tunnel    . lbp gso    . TONSILLECTOMY AND ADENOIDECTOMY       Home Meds: Prior to Admission medications   Medication Sig Start Date End Date Taking? Authorizing Provider  diphenhydrAMINE (BENADRYL) 25 mg capsule Take 25 mg by mouth every 6 (six) hours as needed for allergies.   Yes Historical Provider, MD  EPINEPHrine 0.3 mg/0.3 mL IJ SOAJ injection Inject 0.3 mLs (0.3 mg total) into the muscle once. 04/27/15  Yes Cristal Ford, MD  levothyroxine (SYNTHROID, LEVOTHROID) 137 MCG tablet Take 137 mcg by mouth daily before breakfast.   Yes Historical Provider, MD  metoprolol succinate (TOPROL-XL) 25 MG 24 hr  tablet Take 0.5 tablets by mouth daily.   Yes Historical Provider, MD  vitamin B-12 1000 MCG tablet Take 1 tablet (1,000 mcg total) by mouth daily. 03/11/16  Yes Costin Otelia SergeantM Gherghe, MD    Allergies:  Allergies  Allergen Reactions  . Azithromycin Anaphylaxis  . Codeine Shortness Of Breath  . Eggs Or Egg-Derived Products     ITCHING AND SWELLING OF LIPS  . Ivp Dye [Iodinated Diagnostic Agents] Anaphylaxis  . Other Anaphylaxis, Hives and Swelling    Nuts - Anaphylaxis Ginger - Hives    . Rose Hives  . Shellfish Allergy Anaphylaxis  .  Strawberry Extract Swelling    Pt reports throat swelling and itching with any type of berry  . Avelox [Moxifloxacin Hcl In Nacl]     Irregular heart beat  . Bacitracin Itching and Other (See Comments)    Blisters on skin   . Cinnamon Swelling  . Ginger Swelling  . Natamycin Other (See Comments)    Throat Swelling  . Neomycin Hives  . Onion     MOUTH SWELLING  . Sunscreens [Aminobenzoate] Hives and Swelling    Avobenzene and Oxybenzene  . Watermelon [Citrullus Vulgaris]     ITCHY MOUTH, LIP SWELLING  . Cephalosporins Rash  . Penicillins Hives    Social History   Social History  . Marital status: Married    Spouse name: N/A  . Number of children: N/A  . Years of education: N/A   Occupational History  . Not on file.   Social History Main Topics  . Smoking status: Never Smoker  . Smokeless tobacco: Never Used  . Alcohol use 0.6 oz/week    1 Glasses of wine per week  . Drug use: No  . Sexual activity: Yes   Other Topics Concern  . Not on file   Social History Narrative  . No narrative on file     Family History  Problem Relation Age of Onset  . Allergic rhinitis Mother   . Urticaria Mother   . Allergic rhinitis Father   . Asthma Father   . Allergic rhinitis Sister   . Angioedema Sister   . Urticaria Sister   . Allergic rhinitis Brother   . Asthma Brother      ROS:  Please see the history of present illness.     All other systems reviewed and negative.    Physical Exam: Blood pressure 121/76, pulse 70, height 5\' 5"  (1.651 m), weight 158 lb (71.7 kg), SpO2 98 %. General: Well developed, well nourished female in no acute distress. Head: Normocephalic, atraumatic, sclera non-icteric, no xanthomas, nares are without discharge. EENT: normal  Lymph Nodes:  none Neck: Negative for carotid bruits. JVD not elevated. Back:without scoliosis kyphosis Lungs: Clear bilaterally to auscultation without wheezes, rales, or rhonchi. Breathing is unlabored. Heart: RRR  with S1 S2. No  murmur . No rubs, or gallops appreciated. Abdomen: Soft, non-tender, non-distended with normoactive bowel sounds. No hepatomegaly. No rebound/guarding. No obvious abdominal masses. Msk:  Strength and tone appear normal for age. Extremities: No clubbing or cyanosis. No + edema.  Distal pedal pulses are 2+ and equal bilaterally. Skin: Warm and Dry Neuro: Alert and oriented X 3. CN III-XII intact Grossly normal sensory and motor function . Psych:  Responds to questions appropriately with a normal affect.      Labs: Cardiac Enzymes No results for input(s): CKTOTAL, CKMB, TROPONINI in the last 72 hours. CBC Lab Results  Component Value Date   WBC  7.0 05/17/2016   HGB 11.5 (L) 03/10/2016   HCT 35.8 05/17/2016   MCV 89 05/17/2016   PLT 216 05/17/2016   PROTIME: No results for input(s): LABPROT, INR in the last 72 hours. Chemistry No results for input(s): NA, K, CL, CO2, BUN, CREATININE, CALCIUM, PROT, BILITOT, ALKPHOS, ALT, AST, GLUCOSE in the last 168 hours.  Invalid input(s): LABALBU Lipids Lab Results  Component Value Date   CHOL (H) 03/11/2009    227        ATP III CLASSIFICATION:  <200     mg/dL   Desirable  147-829  mg/dL   Borderline High  >=562    mg/dL   High          HDL 31 (L) 03/11/2009   LDLCALC (H) 03/11/2009    156        Total Cholesterol/HDL:CHD Risk Coronary Heart Disease Risk Table                     Men   Women  1/2 Average Risk   3.4   3.3  Average Risk       5.0   4.4  2 X Average Risk   9.6   7.1  3 X Average Risk  23.4   11.0        Use the calculated Patient Ratio above and the CHD Risk Table to determine the patient's CHD Risk.        ATP III CLASSIFICATION (LDL):  <100     mg/dL   Optimal  130-865  mg/dL   Near or Above                    Optimal  130-159  mg/dL   Borderline  784-696  mg/dL   High  >295     mg/dL   Very High   TRIG 284 (H) 03/11/2009   BNP No results found for: PROBNP Thyroid Function Tests: No  results for input(s): TSH, T4TOTAL, T3FREE, THYROIDAB in the last 72 hours.  Invalid input(s): FREET3 Miscellaneous Lab Results  Component Value Date   DDIMER 0.40 03/08/2016    Radiology/Studies:  No results found.  EKG: Personally reviewed. Demonstrates normal sinus rhythm with normal intervals  Event recorder was personally reviewed. It demonstrated nonsustained ventricular tachycardia 4 beats of nonsustained atrial tachycardia 9 beats  Assessment and Plan:   Syncope-recurrent  Ventricular tachycardia-nonsustained    atrial tachycardia-nonsustained    tachypalpitations-irregular    shortness of breath-abrupt  History of right hemiparesis-transient   The patient has a series of events that might be best elucidated by an implantable loop recorder.  First she has irregular tachycardia palpitations which in the context of a prior episode of hemiparesis the cause of which was never clearly distinguish between acephalic migraine and stroke begs clarification specifically the presence or absence of atrial fibrillation  Second, nonsustained ventricular tachycardia might be the explanation for the abrupt shortness of breath episodes. They could be another arrhythmia as well. Her syncopal episodes are unusual. We have discussed the mechanism of reflex syncope which is the most likely explanation. I do not think it is related to the pains. We have discussed the importance of being aware of the prodrome so as to avoid loss of consciousness and falling.  Her syncope is not likely life-threatening as her ECG is basically normal as is her echocardiogram.  Still the recurrence any associated palpitations suggested maybe an arrhythmic trigger for a reflex event.  Virl Axe

## 2016-06-06 NOTE — Patient Instructions (Signed)
Medication Instructions: - Your physician recommends that you continue on your current medications as directed. Please refer to the Current Medication list given to you today.  Labwork: - none ordered  Procedures/Testing: - Your physician has recommended that you have a loop recorder (LINQ) implanted  This is schedule for _____________________ [facility] at _______________  - once you enter, proceed straight down the hall way to Admitting to register, this will be on your left - you may have a light breakfast the morning of your procedure - you may take your regular medications the morning of your procedure  Follow-Up: - Your physician recommends that you schedule a follow-up appointment in: 10-14 days (from ______) for a wound check appointment with the [facility] nurse.  Any Additional Special Instructions Will Be Listed Below (If Applicable).     If you need a refill on your cardiac medications before your next appointment, please call your pharmacy.

## 2016-06-17 ENCOUNTER — Telehealth: Payer: Self-pay | Admitting: Internal Medicine

## 2016-06-17 NOTE — Telephone Encounter (Signed)
I left a message for the patient earlier that I will cancel her linq for Monday, but to call back if she would like to reschedule.

## 2016-06-17 NOTE — Telephone Encounter (Signed)
New message    pt verbalized that she is calling to speak to rn she wants to cancel the cath lab procedure

## 2016-06-20 ENCOUNTER — Ambulatory Visit (HOSPITAL_COMMUNITY)
Admission: RE | Admit: 2016-06-20 | Payer: PRIVATE HEALTH INSURANCE | Source: Ambulatory Visit | Admitting: Internal Medicine

## 2016-06-20 ENCOUNTER — Encounter (HOSPITAL_COMMUNITY): Admission: RE | Payer: Self-pay | Source: Ambulatory Visit

## 2016-06-20 SURGERY — LOOP RECORDER INSERTION
Anesthesia: LOCAL

## 2016-06-30 ENCOUNTER — Ambulatory Visit: Payer: PRIVATE HEALTH INSURANCE

## 2016-12-22 ENCOUNTER — Other Ambulatory Visit: Payer: Self-pay | Admitting: Family Medicine

## 2016-12-22 DIAGNOSIS — Z1231 Encounter for screening mammogram for malignant neoplasm of breast: Secondary | ICD-10-CM

## 2017-01-03 ENCOUNTER — Ambulatory Visit
Admission: RE | Admit: 2017-01-03 | Discharge: 2017-01-03 | Disposition: A | Discharge status: Home / Self Care | Payer: PRIVATE HEALTH INSURANCE | Source: Ambulatory Visit | Attending: Family Medicine | Admitting: Family Medicine

## 2017-01-03 DIAGNOSIS — Z1231 Encounter for screening mammogram for malignant neoplasm of breast: Secondary | ICD-10-CM

## 2017-01-27 ENCOUNTER — Telehealth: Payer: Self-pay | Admitting: Internal Medicine

## 2017-01-27 NOTE — Telephone Encounter (Signed)
Patient c/o Palpitations:  High priority if patient c/o lightheadedness, shortness of breath, or chest pain  1) How long have you had palpitations/irregular HR/ Afib? Are you having the symptoms now? 10 days / yes  2) Are you currently experiencing lightheadedness, SOB or CP?  no  3) Do you have a history of afib (atrial fibrillation) or irregular heart rhythm? yes  Have you checked your BP or HR? (document readings if available):  no 4) Are you experiencing any other symptoms? no

## 2017-01-27 NOTE — Telephone Encounter (Signed)
Spoke with patient who states heart is "skipping, jumping, beating fast" x approximately 9 days. She c/o extreme fatigue, states symptoms occur intermittently through the day and night, sometimes waking her up. She states she stopped Toprol due to feeling like she will was going to pass out; states BP was 116/90 on 1/2 tab and does not feel that she can tolerate even a low dose of Toprol. She states she had to cancel the Loop recorder insertion earlier this year due to her mother's sickness. I asked if she has had syncope or pre-syncope since the symptoms began and she denies. She states she was nauseated when she woke up this morning but felt better after laying down for a little while. She states she is currently at the hospital with her husband visiting church members. I offered her an appointment to see Heather BalsamAmber Seiler, NP on Wednesday 10/24 and she verbalized agreement with plan. She thanked me for the call.

## 2017-01-31 NOTE — Progress Notes (Signed)
Electrophysiology Office Note Date: 02/01/2017  ID:  Heather Carey, DOB [birth year], MRN [id number]  PCP: Sigmund Hazel, MD Primary Cardiologist: Mayford Knife Electrophysiologist: Graciela Husbands  CC: palpitations  Heather Carey is a 66 y.o. female seen today for Dr Graciela Husbands.  She presents today for routine electrophysiology followup.  She was seen by Dr Graciela Husbands in consult in February of this year and ILR was recommended for evaluation of palpitations and syncope.  She had to cancel that procedure due to her mother's illness. She called into the office 10/19 with recurrent palpitations and was added onto my schedule today. In the last couple of weeks, she noted increase in palpitations, fatigue, and feelings of pre-syncope.  She has increased fluid intake and her symptoms have improved. She denies chest pain, dyspnea, PND, orthopnea, nausea, vomiting, syncope, edema, weight gain, or early satiety.  Past Medical History:  Diagnosis Date  . Diverticulitis   . Eosinophilic esophagitis   . Fibromyalgia   . GERD (gastroesophageal reflux disease)   . Hyperlipidemia   . Hypertension   . Hypothyroidism   . IBS (irritable bowel syndrome)   . Migraines   . Mitral regurgitation 02/28/2014   Mild to moderate by echo 02/2014  . Obesity   . Osteoarthritis   . Otitis externa   . Palpitations   . TIA (transient ischemic attack)    Past Surgical History:  Procedure Laterality Date  . ABDOMINAL HYSTERECTOMY    . carpel tunnel    . lbp gso    . TONSILLECTOMY AND ADENOIDECTOMY      Current Outpatient Prescriptions  Medication Sig Dispense Refill  . Diphenhydramine Cit-Pseudoeph (BENADRYL ALLERGY/COLD FASTMELT PO) Take 25 mg by mouth every 6 (six) hours as needed (allergic reaction).    Marland Kitchen EPINEPHrine 0.3 mg/0.3 mL IJ SOAJ injection Inject 0.3 mLs (0.3 mg total) into the muscle once. 2 Device 2  . famotidine (PEPCID) 20 MG tablet Take by mouth as directed.    Marland Kitchen levothyroxine (SYNTHROID, LEVOTHROID) 137 MCG  tablet Take 137 mcg by mouth at bedtime.     . predniSONE (DELTASONE) 20 MG tablet Take by mouth as directed. Allergic reactions    . PROAIR HFA 108 (90 Base) MCG/ACT inhaler Inhale 2 puffs into the lungs every 6 (six) hours as needed for shortness of breath.  0  . vitamin B-12 1000 MCG tablet Take 1 tablet (1,000 mcg total) by mouth daily. 30 tablet 0  . metoprolol succinate (TOPROL-XL) 25 MG 24 hr tablet Take 0.5 tablets (12.5 mg total) by mouth daily. (Patient not taking: Reported on 02/01/2017) 15 tablet 6   No current facility-administered medications for this visit.     Allergies:   Azithromycin; Beef-derived products; Chicken allergy; Codeine; Eggs or egg-derived products; Ivp dye [iodinated diagnostic agents]; Natamycin; Other; Rose; Shellfish allergy; Thimerosol [thimerosal]; Strawberry extract; Adhesive [tape]; Avelox [moxifloxacin hcl in nacl]; Bacitracin; Benzalkonium chloride; Cinnamon; Ginger; Metoprolol; Neomycin; Onion; Sunscreens [aminobenzoate]; Watermelon [citrullus vulgaris]; Cephalosporins; and Penicillins   Social History: Social History   Social History  . Marital status: Married    Spouse name: N/A  . Number of children: N/A  . Years of education: N/A   Occupational History  . Not on file.   Social History Main Topics  . Smoking status: Never Smoker  . Smokeless tobacco: Never Used  . Alcohol use 0.6 oz/week    1 Glasses of wine per week  . Drug use: No  . Sexual activity: Yes   Other Topics Concern  .  Not on file   Social History Narrative  . No narrative on file    Family History: Family History  Problem Relation Age of Onset  . Allergic rhinitis Mother   . Urticaria Mother   . Allergic rhinitis Father   . Asthma Father   . Allergic rhinitis Sister   . Angioedema Sister   . Urticaria Sister   . Allergic rhinitis Brother   . Asthma Brother     Review of Systems: All other systems reviewed and are otherwise negative except as noted  above.   Physical Exam: VS:  BP 110/68   Pulse 64   Ht 5' 5.5" (1.664 m)   Wt 163 lb 12.8 oz (74.3 kg)   SpO2 96%   BMI 26.84 kg/m  , BMI Body mass index is 26.84 kg/m. Wt Readings from Last 3 Encounters:  02/01/17 163 lb 12.8 oz (74.3 kg)  06/06/16 158 lb (71.7 kg)  05/17/16 156 lb (70.8 kg)    GEN- The patient is well appearing, alert and oriented x 3 today.   HEENT: normocephalic, atraumatic; sclera clear, conjunctiva pink; hearing intact; oropharynx clear; neck supple Lungs- Clear to ausculation bilaterally, normal work of breathing.  No wheezes, rales, rhonchi Heart- Regular rate and rhythm, no murmurs, rubs or gallops GI- soft, non-tender, non-distended, bowel sounds present Extremities- no clubbing, cyanosis, or edema MS- no significant deformity or atrophy Skin- warm and dry, no rash or lesion  Psych- euthymic mood, full affect Neuro- strength and sensation are intact   EKG:  EKG is ordered today. The ekg ordered today shows sinus rhythm  Recent Labs: 03/08/2016: ALT 14 03/09/2016: Magnesium 2.1; TSH 14.078 05/17/2016: BUN 11; Creatinine, Ser 0.75; Hemoglobin 12.1; Platelets 216; Potassium 3.7; Sodium 141    Other studies Reviewed: Additional studies/ records that were reviewed today include: Dr Odessa FlemingKlein's office notes  Assessment and Plan:  1.  Pre-syncope/palpitations EKG normal today We discussed rationale for ILR implant Risks, benefits reviewed and she will call back if she wants to proceed Symptoms have improved with increased hydration.  2.  HTN Stable No change required today   Current medicines are reviewed at length with the patient today.   The patient does not have concerns regarding her medicines.  The following changes were made today:  none  Labs/ tests ordered today include: none Orders Placed This Encounter  Procedures  . TSH  . CBC  . Basic metabolic panel  . Magnesium  . EKG 12-Lead     Disposition:   Follow up with Dr Graciela HusbandsKlein  as needed   Signed, Gypsy BalsamAmber Jeffrey Voth, NP 02/01/2017 10:27 AM   [facility] [address] [place] [address] [phone] (office) [phone] (fax)

## 2017-02-01 ENCOUNTER — Encounter: Payer: Self-pay | Admitting: Nurse Practitioner

## 2017-02-01 ENCOUNTER — Ambulatory Visit (INDEPENDENT_AMBULATORY_CARE_PROVIDER_SITE_OTHER): Payer: PRIVATE HEALTH INSURANCE | Admitting: Nurse Practitioner

## 2017-02-01 VITALS — BP 110/68 | HR 64 | Ht 65.5 in | Wt 163.8 lb

## 2017-02-01 DIAGNOSIS — R002 Palpitations: Secondary | ICD-10-CM

## 2017-02-01 DIAGNOSIS — I10 Essential (primary) hypertension: Secondary | ICD-10-CM

## 2017-02-01 DIAGNOSIS — R55 Syncope and collapse: Secondary | ICD-10-CM

## 2017-02-01 LAB — TSH: TSH: 5.23 u[IU]/mL — ABNORMAL HIGH (ref 0.450–4.500)

## 2017-02-01 LAB — CBC
Hematocrit: 35.4 % (ref 34.0–46.6)
Hemoglobin: 12 g/dL (ref 11.1–15.9)
MCH: 30.4 pg (ref 26.6–33.0)
MCHC: 33.9 g/dL (ref 31.5–35.7)
MCV: 90 fL (ref 79–97)
Platelets: 207 10*3/uL (ref 150–379)
RBC: 3.95 x10E6/uL (ref 3.77–5.28)
RDW: 14.3 % (ref 12.3–15.4)
WBC: 5.6 10*3/uL (ref 3.4–10.8)

## 2017-02-01 LAB — BASIC METABOLIC PANEL
BUN/Creatinine Ratio: 13 (ref 12–28)
BUN: 9 mg/dL (ref 8–27)
CO2: 24 mmol/L (ref 20–29)
Calcium: 9 mg/dL (ref 8.7–10.3)
Chloride: 104 mmol/L (ref 96–106)
Creatinine, Ser: 0.7 mg/dL (ref 0.57–1.00)
GFR calc Af Amer: 106 mL/min/{1.73_m2} (ref >59)
GFR calc non Af Amer: 92 mL/min/{1.73_m2} (ref >59)
Glucose: 92 mg/dL (ref 65–99)
Potassium: 4 mmol/L (ref 3.5–5.2)
Sodium: 140 mmol/L (ref 134–144)

## 2017-02-01 LAB — MAGNESIUM: Magnesium: 2.3 mg/dL (ref 1.6–2.3)

## 2017-02-01 NOTE — Patient Instructions (Signed)
Medication Instructions:   Your physician recommends that you continue on your current medications as directed. Please refer to the Current Medication list given to you today.   If you need a refill on your cardiac medications before your next appointment, please call your pharmacy.  Labwork:  BMET CBC  TSH AND MAG    Testing/Procedures: NONE ORDERED  TODAY      Follow-Up: CONTACT [facility] [phone] AS NEEDED FOR  ANY CARDIAC RELATED SYMPTOMS   Any Other Special Instructions Will Be Listed Below (If Applicable).

## 2017-02-03 ENCOUNTER — Telehealth: Payer: Self-pay | Admitting: *Deleted

## 2017-02-03 NOTE — Telephone Encounter (Signed)
LMOVM TO CALL BACK  

## 2017-02-03 NOTE — Telephone Encounter (Signed)
-----   Message from Marily LenteAmber K Seiler, NP sent at 02/02/2017  5:15 AM EDT ----- Please notify patient of lab results. Please send copy to PCP.  TSH slightly elevated, improved from last check.  She should follow up with PCP. Otherwise labs are normal.

## 2017-04-20 ENCOUNTER — Emergency Department (HOSPITAL_COMMUNITY)
Admission: EM | Admit: 2017-04-20 | Discharge: 2017-04-20 | Disposition: A | Discharge status: Home / Self Care | Payer: PRIVATE HEALTH INSURANCE | Attending: Emergency Medicine | Admitting: Emergency Medicine

## 2017-04-20 ENCOUNTER — Encounter (HOSPITAL_COMMUNITY): Payer: Self-pay

## 2017-04-20 ENCOUNTER — Telehealth: Payer: Self-pay | Admitting: Internal Medicine

## 2017-04-20 ENCOUNTER — Emergency Department (HOSPITAL_COMMUNITY): Payer: PRIVATE HEALTH INSURANCE

## 2017-04-20 ENCOUNTER — Other Ambulatory Visit: Payer: Self-pay

## 2017-04-20 DIAGNOSIS — I10 Essential (primary) hypertension: Secondary | ICD-10-CM | POA: Diagnosis not present

## 2017-04-20 DIAGNOSIS — E039 Hypothyroidism, unspecified: Secondary | ICD-10-CM | POA: Insufficient documentation

## 2017-04-20 DIAGNOSIS — H538 Other visual disturbances: Secondary | ICD-10-CM | POA: Diagnosis present

## 2017-04-20 DIAGNOSIS — Z79899 Other long term (current) drug therapy: Secondary | ICD-10-CM | POA: Insufficient documentation

## 2017-04-20 DIAGNOSIS — I471 Supraventricular tachycardia: Secondary | ICD-10-CM | POA: Diagnosis not present

## 2017-04-20 LAB — CBC WITH DIFFERENTIAL/PLATELET
Basophils Absolute: 0 10*3/uL (ref 0.0–0.1)
Basophils Relative: 1 %
Eosinophils Absolute: 0.2 10*3/uL (ref 0.0–0.7)
Eosinophils Relative: 3 %
HCT: 39.2 % (ref 36.0–46.0)
Hemoglobin: 13.2 g/dL (ref 12.0–15.0)
Lymphocytes Relative: 40 %
Lymphs Abs: 2.8 10*3/uL (ref 0.7–4.0)
MCH: 29.4 pg (ref 26.0–34.0)
MCHC: 33.7 g/dL (ref 30.0–36.0)
MCV: 87.3 fL (ref 78.0–100.0)
Monocytes Absolute: 0.4 10*3/uL (ref 0.1–1.0)
Monocytes Relative: 6 %
Neutro Abs: 3.5 10*3/uL (ref 1.7–7.7)
Neutrophils Relative %: 50 %
Platelets: 283 10*3/uL (ref 150–400)
RBC: 4.49 MIL/uL (ref 3.87–5.11)
RDW: 12.4 % (ref 11.5–15.5)
WBC: 6.9 10*3/uL (ref 4.0–10.5)

## 2017-04-20 LAB — PROTIME-INR
INR: 0.94
Prothrombin Time: 12.5 seconds (ref 11.4–15.2)

## 2017-04-20 LAB — COMPREHENSIVE METABOLIC PANEL
ALT: 18 U/L (ref 14–54)
AST: 22 U/L (ref 15–41)
Albumin: 4 g/dL (ref 3.5–5.0)
Alkaline Phosphatase: 61 U/L (ref 38–126)
Anion gap: 11 (ref 5–15)
BUN: 9 mg/dL (ref 6–20)
CO2: 22 mmol/L (ref 22–32)
Calcium: 9.7 mg/dL (ref 8.9–10.3)
Chloride: 108 mmol/L (ref 101–111)
Creatinine, Ser: 1.03 mg/dL — ABNORMAL HIGH (ref 0.44–1.00)
GFR calc Af Amer: >60 mL/min (ref >60)
GFR calc non Af Amer: 56 mL/min — ABNORMAL LOW (ref >60)
Glucose, Bld: 104 mg/dL — ABNORMAL HIGH (ref 65–99)
Potassium: 3.7 mmol/L (ref 3.5–5.1)
Sodium: 141 mmol/L (ref 135–145)
Total Bilirubin: 0.9 mg/dL (ref 0.3–1.2)
Total Protein: 7.4 g/dL (ref 6.5–8.1)

## 2017-04-20 LAB — MAGNESIUM: Magnesium: 2.2 mg/dL (ref 1.7–2.4)

## 2017-04-20 LAB — I-STAT TROPONIN, ED: Troponin i, poc: 0.01 ng/mL (ref 0.00–0.08)

## 2017-04-20 LAB — TSH: TSH: 0.021 u[IU]/mL — ABNORMAL LOW (ref 0.350–4.500)

## 2017-04-20 LAB — APTT: aPTT: 28 seconds (ref 24–36)

## 2017-04-20 MED ORDER — LABETALOL HCL 5 MG/ML IV SOLN
10.0000 mg | Freq: Once | INTRAVENOUS | Status: AC
  Administered 2017-04-20: 10 mg via INTRAVENOUS
  Filled 2017-04-20: qty 4

## 2017-04-20 MED ORDER — SODIUM CHLORIDE 0.9 % IV BOLUS (SEPSIS)
1000.0000 mL | Freq: Once | INTRAVENOUS | Status: AC
Start: 2017-04-20 — End: 2017-04-20
  Administered 2017-04-20: 1000 mL via INTRAVENOUS

## 2017-04-20 MED ORDER — SODIUM CHLORIDE 0.9 % IV SOLN
INTRAVENOUS | Status: DC
  Administered 2017-04-20: 11:00:00 via INTRAVENOUS

## 2017-04-20 MED ORDER — ADENOSINE 6 MG/2ML IV SOLN
INTRAVENOUS | Status: AC
  Filled 2017-04-20: qty 6

## 2017-04-20 MED ORDER — METOPROLOL SUCCINATE ER 25 MG PO TB24: 12.5000 mg | ORAL_TABLET | Freq: Every day | ORAL | 0 refills | Status: DC

## 2017-04-20 NOTE — Telephone Encounter (Signed)
Patient states that she has felt her heart racing for the past 2 days. She states that she has not been able to sleep. She states that she has been sick with the flu for the past 2 weeks. She states that she has felt dizzy for the past 2 weeks since she has been sick. Patient does not have any vitals to offer. The last time the patient was seen ILR was discussed. The patient is not interested in proceeding with that at this current time. Patient denies any chest pain, SOB, syncope, or any other symptoms. Patient states that she has not been taking her Toprol. Advised patient to take her Toprol 12.5 QD and to stay hydrated to see if that helps. Made patient aware that I would sent the information to Dr. Graciela HusbandsKlein and Gypsy BalsamAmber Seiler, NP for further review. ER precautions reviewed with the patient who verbalizes understanding and is in agreement.

## 2017-04-20 NOTE — ED Notes (Signed)
Patient denies pain and is resting comfortably.  

## 2017-04-20 NOTE — ED Notes (Signed)
Patient transported to X-ray 

## 2017-04-20 NOTE — ED Notes (Signed)
Dr Karma GanjaLinker in room. Pt in SVT at 176 with sx of dizziness. Assisted to vagal maneuver with success. Pt now sinus tach at 110

## 2017-04-20 NOTE — Telephone Encounter (Signed)
Agree with resuming BB and hydration. Check BP at home. If she would like to proceed with ILR to diagnose palpitations, I will be happy to help arrange  Gypsy BalsamAmber Esme Durkin, NP 04/20/2017 11:07 AM

## 2017-04-20 NOTE — Telephone Encounter (Signed)
New Message   STAT if HR is under 50 or over 120 (normal HR is 60-100 beats per minute)  1) What is your heart rate? Doesn't know   2) Do you have a log of your heart rate readings (document readings)? no   3) Do you have any other symptoms? Dizziness  Patient states that since the past 2 days she has been having fast heartbeats.

## 2017-04-20 NOTE — Discharge Instructions (Signed)
Return to the ED with any concerns including chest pain, fainting, difficulty breathing, decreased level of alertness/lethargy, or any other alarming symptoms °

## 2017-04-20 NOTE — ED Notes (Signed)
Pt began having vision changes Tuesday night in her right eye. Pt went to the eye dr yesterday and her hr went up but she did not tell anyone. This continued to get higher last night and this morning. Pt began getting dizzy with shob. Pt axox4. Noted to be in SVT. Pt taken back to room and placed on zoll.

## 2017-04-20 NOTE — ED Provider Notes (Signed)
[facility] Provider Note   CSN: [id number] Arrival date & time: 04/20/17  1048     History   Chief Complaint No chief complaint on file.   HPI Heather Carey is a 67 y.o. female.  HPI  A LEVEL 5 CAVEAT PERTAINS DUE TO URGENT NEED FOR INTERVENTION Patient with history of palpitations, hypertension, hypothyroid presenting in SVT.  She was brought in through triage and was too weak to stand so brought back emergently to a room.  She was placed on monitor heart rate 170s-200 and SVT.  Vagal maneuvers helped to decrease her heart rate and bring her back into sinus rhythm.  She states she has been sick with a flulike illness for the past 2 weeks and is felt weak and tired.  She had some blurry vision 2 nights ago and since then her heart rate has been running fast.  She states she called her cardiologist this morning and was told to take a dose of metoprolol but symptoms became worse and she felt very weak so she came to the ED instead.  Past Medical History:  Diagnosis Date  . Diverticulitis   . Eosinophilic esophagitis   . Fibromyalgia   . GERD (gastroesophageal reflux disease)   . Hyperlipidemia   . Hypertension   . Hypothyroidism   . IBS (irritable bowel syndrome)   . Migraines   . Mitral regurgitation 02/28/2014   Mild to moderate by echo 02/2014  . Obesity   . Osteoarthritis   . Otitis externa   . Palpitations   . TIA (transient ischemic attack)     Patient Active Problem List   Diagnosis Date Noted  . Malnutrition of moderate degree 03/09/2016  . Syncope 03/08/2016  . Sensory disturbance 03/08/2016  . Hypothyroidism 03/08/2016  . Urticaria with associated angioedema 04/27/2015  . Angioedema 04/27/2015  . Allergic reaction 01/07/2015  . Food allergy 01/07/2015  . Mitral regurgitation 02/28/2014  . MVP (mitral valve prolapse) 02/24/2014  . SOB (shortness of breath) 02/24/2014  . Snoring 02/24/2014  . Hypertension   .  Hyperlipidemia   . Chest pain 02/10/2014    Past Surgical History:  Procedure Laterality Date  . ABDOMINAL HYSTERECTOMY    . carpel tunnel    . lbp gso    . TONSILLECTOMY AND ADENOIDECTOMY      OB History    No data available       Home Medications    Prior to Admission medications   Medication Sig Start Date End Date Taking? Authorizing Provider  Diphenhydramine Cit-Pseudoeph (BENADRYL ALLERGY/COLD FASTMELT PO) Take 25 mg by mouth every 6 (six) hours as needed (allergic reaction).    [provider]  EPINEPHrine 0.3 mg/0.3 mL IJ SOAJ injection Inject 0.3 mLs (0.3 mg total) into the muscle once. 04/27/15   Bobbitt, Heywood Ilesalph Carter, MD  famotidine (PEPCID) 20 MG tablet Take by mouth as directed.    [provider]  levothyroxine (SYNTHROID, LEVOTHROID) 137 MCG tablet Take 137 mcg by mouth at bedtime.     [provider]  metoprolol succinate (TOPROL-XL) 25 MG 24 hr tablet Take 0.5 tablets (12.5 mg total) by mouth daily. 04/20/17   Kacen Mellinger, Latanya MaudlinMartha L, MD  predniSONE (DELTASONE) 20 MG tablet Take by mouth as directed. Allergic reactions 08/28/15   [provider]  PROAIR HFA 108 (90 Base) MCG/ACT inhaler Inhale 2 puffs into the lungs every 6 (six) hours as needed for shortness of breath. 06/08/16   [provider]  vitamin B-12 1000 MCG tablet Take 1 tablet (1,000 mcg total) by mouth daily. 03/11/16   Leatha Gilding, MD    Family History Family History  Problem Relation Age of Onset  . Allergic rhinitis Mother   . Urticaria Mother   . Allergic rhinitis Father   . Asthma Father   . Allergic rhinitis Sister   . Angioedema Sister   . Urticaria Sister   . Allergic rhinitis Brother   . Asthma Brother     Social History Social History   Tobacco Use  . Smoking status: Never Smoker  . Smokeless tobacco: Never Used  Substance Use Topics  . Alcohol use: Yes    Alcohol/week: 0.6 oz    Types: 1 Glasses of wine per week  . Drug use: No      Allergies   Azithromycin; Beef-derived products; Chicken allergy; Codeine; Eggs or egg-derived products; Ivp dye [iodinated diagnostic agents]; Natamycin; Other; Rose; Shellfish allergy; Thimerosol [thimerosal]; Strawberry extract; Adhesive [tape]; Avelox [moxifloxacin hcl in nacl]; Bacitracin; Benzalkonium chloride; Cinnamon; Ginger; Metoprolol; Neomycin; Onion; Sunscreens [aminobenzoate]; Watermelon [citrullus vulgaris]; Cephalosporins; and Penicillins   Review of Systems Review of Systems  UNABLE TO OBTAIN ROS DUE TO LEVEL 5 CAVEAT   Physical Exam Updated Vital Signs BP 101/67   Pulse 79   Temp 98.4 F (36.9 C)   Resp 13   Ht 5\' 5"  (1.651 m)   Wt 72.6 kg (160 lb)   SpO2 96%   BMI 26.63 kg/m  Vitals reviewed Physical Exam  Physical Examination: General appearance - alert, tired and weak appearing, and in mild distress Mental status - alert, oriented to person, place, and time Eyes  No conjunctival injection, no scleral icterus Mouth - mucous membranes moist, pharynx normal without lesions Chest - clear to auscultation, no wheezes, rales or rhonchi, symmetric air entry Heart  rapid rate, regular rhythm, normal S1, S2, no murmurs, rubs, clicks or gallops Abdomen - soft, nontender, nondistended, no masses or organomegaly Neurological - alert, oriented, normal speech,  Extremities - peripheral pulses normal, no pedal edema, no clubbing or cyanosis Skin - normal coloration and turgor, no rashes,    ED Treatments / Results  Labs (all labs ordered are listed, but only abnormal results are displayed) Labs Reviewed  COMPREHENSIVE METABOLIC PANEL - Abnormal; Notable for the following components:      Result Value   Glucose, Bld 104 (*)    Creatinine, Ser 1.03 (*)    GFR calc non Af Amer 56 (*)    All other components within normal limits  TSH - Abnormal; Notable for the following components:   TSH 0.021 (*)    All other components within normal limits  CBC WITH  DIFFERENTIAL/PLATELET  PROTIME-INR  APTT  MAGNESIUM  I-STAT TROPONIN, ED    EKG  EKG Interpretation  Date/Time:  Thursday April 20 2017 11:09:42 EST Ventricular Rate:  104 PR Interval:    QRS Duration: 113 QT Interval:  333 QTC Calculation: 438 R Axis:   50 Text Interpretation:  Sinus tachycardia Multiform ventricular premature complexes Posterior infarct, old Probable lateral infarct, age indeterminate Since previous tracing SVT has resolved Confirmed by Jerelyn Scott [phone]) on 04/20/2017 11:47:39 AM       Radiology Dg Chest 2 View  Result Date: 04/20/2017 CLINICAL DATA:  Supraventricular tachycardia. Chest pain and shortness of breath. EXAM: CHEST  2 VIEW COMPARISON:  PA and lateral chest 03/08/2016. FINDINGS: The lungs are clear. Heart size is normal. No pneumothorax  or pleural fluid. Defibrillator pads are in place. Defibrillator pads are in place. No acute bony abnormality. IMPRESSION: No acute disease. Electronically Signed   By: Drusilla Kanner M.D.   On: 04/20/2017 12:05    Procedures Procedures (including critical care time)  Medications Ordered in ED Medications  adenosine (ADENOCARD) 6 MG/2ML injection (  Not Given 04/20/17 1242)  0.9 %  sodium chloride infusion ( Intravenous Stopped 04/20/17 1336)  sodium chloride 0.9 % bolus 1,000 mL (0 mLs Intravenous Stopped 04/20/17 1152)  labetalol (NORMODYNE,TRANDATE) injection 10 mg (10 mg Intravenous Given 04/20/17 1229)     Initial Impression / Assessment and Plan / ED Course  I have reviewed the triage vital signs and the nursing notes.  Pertinent labs & imaging results that were available during my care of the patient were reviewed by me and considered in my medical decision making (see chart for details).    1:12 PM  D/w Dr. Jens Som, cardiology- pt can be discharged, should take her metoprolol and can followup as an outpatient in the cardiology clinic.      Patient was seen emergently upon being brought back  from triage.  Her heart rate was in the 170s with SVT on EKG.  IV access was obtained patient placed on monitor.  She had a good blood pressure and oxygen level.  Patient asked to blow into a syringe as a vagal maneuver and this decreased her heart rate down into the 90s.  Repeat EKG shows sinus rhythm.  Patient treated with IV fluids and labs obtained.  These were reassuring.  On recheck patient feels much improved and is agreeable with plan as outlined above per cardiology.  She requested and refill of her metoprolol which was provided  Final Clinical Impressions(s) / ED Diagnoses   Final diagnoses:  SVT (supraventricular tachycardia) [facility])    ED Discharge Orders        Ordered    metoprolol succinate (TOPROL-XL) 25 MG 24 hr tablet  Daily     04/20/17 1321       Phillis Haggis, MD 04/20/17 1402

## 2017-04-20 NOTE — ED Notes (Signed)
D/c reviewed with patient and spouse. Encouraged to follow up with cardiology as ordered. No further question at this time

## 2017-04-23 NOTE — Progress Notes (Signed)
Cardiology Office Note Date:  04/24/2017  Patient ID:  Heather Carey, DOB [birth year], MRN [id number] PCP:  Sigmund HazelMiller, Lisa, MD  Cardiologist:  Dr. Mayford Knifeurner Electrophysiologist: Dr. Graciela HusbandsKlein    Chief Complaint: SVT f/u  History of Present Illness: Heather Carey is a 67 y.o. female with history of fibromyalgia, HTN, HLD, hypothyroidism, TIA, OA.  She was last seen by EP by A. Seiler in October, at that visit had missed ILR implant procedure due to her mother's illness, she had ongoing c/o palpitations and planned for ILR though not yet done.  She was more recently seen in the ER 04/20/17 with generalized weakness, palpitations, reports of recent GI illness.  ER MD note reports HR 170-200, SVT, vagal maneuver resolved the tachycardia, continue her metoprolol and f/u out patient.  2 nights prior to her going to the ER she mentioned she had R eye visual change with a lateral movement of blurring/change that seemed to move in/out, she saw her eye MD who thought was vitreous detachment.  She has not had any focal weakness or symptoms otherwise.  She has been having palpitations on off, the following day developed a racing sensation and much more persistent, the day she went to the ER associated with left arm numbness/tingling, and SOB.  She was found in an SVT 178bpm.  Initially adenosine was ineffective though a vagal maneuver resulted in SR and resolution of symptoms.    Her TSH was low and her PMD decreased her synthroid dose.  She is getting more regular palpitations, almost predictably about 1AM waking with them, rolls to her R side takes some deep breaths and resolves.  She has not had any CP.  Outside of her palpitations, she reports no SOB.  She denies any exertional symptoms or intolerances.  NO near syncope or syncope.  No on oing visual changes.  Past Medical History:  Diagnosis Date  . Diverticulitis   . Eosinophilic esophagitis   . Fibromyalgia   . GERD (gastroesophageal reflux disease)   .  Hyperlipidemia   . Hypertension   . Hypothyroidism   . IBS (irritable bowel syndrome)   . Migraines   . Mitral regurgitation 02/28/2014   Mild to moderate by echo 02/2014  . Obesity   . Osteoarthritis   . Otitis externa   . Palpitations   . TIA (transient ischemic attack)     Past Surgical History:  Procedure Laterality Date  . ABDOMINAL HYSTERECTOMY    . carpel tunnel    . lbp gso    . TONSILLECTOMY AND ADENOIDECTOMY      Current Outpatient Medications  Medication Sig Dispense Refill  . Diphenhydramine Cit-Pseudoeph (BENADRYL ALLERGY/COLD FASTMELT PO) Take 25 mg by mouth every 6 (six) hours as needed (allergic reaction).    Marland Kitchen. EPINEPHrine 0.3 mg/0.3 mL IJ SOAJ injection Inject 0.3 mLs (0.3 mg total) into the muscle once. 2 Device 2  . famotidine (PEPCID) 20 MG tablet Take by mouth as directed.    Marland Kitchen. levothyroxine (SYNTHROID, LEVOTHROID) 137 MCG tablet Take 137 mcg by mouth at bedtime.     . metoprolol succinate (TOPROL-XL) 25 MG 24 hr tablet Take 0.5 tablets (12.5 mg total) by mouth 2 (two) times daily. 30 tablet 3  . PROAIR HFA 108 (90 Base) MCG/ACT inhaler Inhale 2 puffs into the lungs every 6 (six) hours as needed for shortness of breath.  0  . vitamin B-12 1000 MCG tablet Take 1 tablet (1,000 mcg total) by mouth daily. 30 tablet 0  No current facility-administered medications for this visit.     Allergies:   Azithromycin; Beef-derived products; Chicken allergy; Codeine; Eggs or egg-derived products; Ivp dye [iodinated diagnostic agents]; Natamycin; Other; Rose; Shellfish allergy; Thimerosol [thimerosal]; Strawberry extract; Adhesive [tape]; Avelox [moxifloxacin hcl in nacl]; Bacitracin; Benzalkonium chloride; Cinnamon; Ginger; Neomycin; Onion; Sunscreens [aminobenzoate]; Watermelon [citrullus vulgaris]; Cephalosporins; and Penicillins   Social History:  The patient  reports that  has never smoked. she has never used smokeless tobacco. She reports that she drinks about 0.6 oz  of alcohol per week. She reports that she does not use drugs.   Family History:  The patient's family history includes Allergic rhinitis in her brother, father, mother, and sister; Angioedema in her sister; Asthma in her brother and father; Urticaria in her mother and sister.  ROS:  Please see the history of present illness.  All other systems are reviewed and otherwise negative.   PHYSICAL EXAM:  VS:  BP 121/77   Pulse 68   Ht 5\' 5"  (1.651 m)   Wt 162 lb (73.5 kg)   BMI 26.96 kg/m  BMI: Body mass index is 26.96 kg/m. Well nourished, well developed, in no acute distress  HEENT: normocephalic, atraumatic  Neck: no JVD, carotid bruits or masses Cardiac: RRR; no significant murmurs, no rubs, or gallops Lungs: CTA b/l, no wheezing, rhonchi or rales  Abd: soft, nontender MS: no deformity or atrophy Ext:  no edema  Skin: warm and dry, she has large areas of skin erythema irritation where her ECG leads were, she state improviong, had been large hives/weeping.  Skin appears intact. Neuro:  No gross deficits appreciated Psych: euthymic mood, full affect   EKG:  Not done today 04/20/17: #1 is SVT 178bpm, QRS 70ms #2 is SR/ST 104bpm, PR , QRS , QTc  03/21/16: TTE Study Conclusions - Left ventricle: The cavity size was normal. Systolic function was   normal. The estimated ejection fraction was in the range of 55%   to 60%. Wall motion was normal; there were no regional wall   motion abnormalities. The pulmonary vein flow pattern was normal.   The tissue Doppler parameters were normal. Left ventricular   diastolic function parameters were normal. - Aortic valve: There was no regurgitation. - Aortic root: The aortic root was normal in size. - Mitral valve: Structurally normal valve. There was mild   regurgitation. - Left atrium: The atrium was at the upper limits of normal in   size. - Right ventricle: The cavity size was normal. Wall thickness was   normal. Systolic  function was normal. - Right atrium: The atrium was normal in size. - Tricuspid valve: There was mild regurgitation. - Pulmonary arteries: Systolic pressure was within the normal   range. - Inferior vena cava: The vessel was normal in size. - Pericardium, extracardiac: There was no pericardial effusion.  Recent Labs: 04/20/2017: ALT 18; BUN 9; Creatinine, Ser 1.03; Hemoglobin 13.2; Magnesium 2.2; Platelets 283; Potassium 3.7; Sodium 141; TSH 0.021  No results found for requested labs within last 8760 hours.   Estimated Creatinine Clearance: 55.4 mL/min (A) (by C-G formula based on SCr of 1.03 mg/dL (H)).   Wt Readings from Last 3 Encounters:  04/24/17 162 lb (73.5 kg)  04/20/17 160 lb (72.6 kg)  02/01/17 163 lb 12.8 oz (74.3 kg)     Other studies reviewed: Additional studies/records reviewed today include: summarized above  ASSESSMENT AND PLAN:  1. SVT     I have reviewed EKG and case  with Dr. Ladona Ridgel who thinks she would be a good ablation candidate.  He was able to introduce himself to her, I discussed ablation with her, she would like to see Dr. Ladona Ridgel formally to discuss in more detail, will have her see him next week     Increase her Toprol from 12.5 AM to BID  She has hx of TIA in 2012, eye MD felt transient visual change was a vitreous detachment.  She has what seems a long hx of palpitations.  SVT would not put her at increased stroke risk.  I will defer to Dr. Ladona Ridgel, if loop may be of any benefit still to evaluate for possible PAF?   2. HTN     Looks good    Disposition: F/u with Dr. Ladona Ridgel next week  Current medicines are reviewed at length with the patient today.  The patient did not have any concerns regarding medicines.  Norma Fredrickson, PA-C 04/24/2017 4:11 PM     [facility] [address] [place] [address] [phone] (office)  [phone] (fax)

## 2017-04-24 ENCOUNTER — Ambulatory Visit: Payer: PRIVATE HEALTH INSURANCE | Admitting: Physician Assistant

## 2017-04-24 VITALS — BP 121/77 | HR 68 | Ht 65.0 in | Wt 162.0 lb

## 2017-04-24 DIAGNOSIS — I471 Supraventricular tachycardia: Secondary | ICD-10-CM

## 2017-04-24 DIAGNOSIS — I10 Essential (primary) hypertension: Secondary | ICD-10-CM | POA: Diagnosis not present

## 2017-04-24 MED ORDER — METOPROLOL SUCCINATE ER 25 MG PO TB24: 12.5000 mg | ORAL_TABLET | Freq: Two times a day (BID) | ORAL | 3 refills | Status: DC

## 2017-04-24 NOTE — Patient Instructions (Addendum)
Medication Instructions:   START TAKING TOPROL  12.5 MG TWICE A DAY   If you need a refill on your cardiac medications before your next appointment, please call your pharmacy.  Labwork: NONE ORDERED  TODAY    Testing/Procedures: NONE ORDERED  TODAY    Follow-Up: WITH DR Ladona RidgelAYLOR  AS SCHEDULED   Any Other Special Instructions Will Be Listed Below (If Applicable).

## 2017-05-04 ENCOUNTER — Ambulatory Visit (INDEPENDENT_AMBULATORY_CARE_PROVIDER_SITE_OTHER): Payer: PRIVATE HEALTH INSURANCE | Admitting: Internal Medicine

## 2017-05-04 ENCOUNTER — Encounter: Payer: Self-pay | Admitting: Internal Medicine

## 2017-05-04 VITALS — BP 98/78 | HR 76 | Resp 16 | Ht 65.5 in | Wt 160.8 lb

## 2017-05-04 DIAGNOSIS — R002 Palpitations: Secondary | ICD-10-CM | POA: Diagnosis not present

## 2017-05-04 DIAGNOSIS — I471 Supraventricular tachycardia: Secondary | ICD-10-CM | POA: Diagnosis not present

## 2017-05-04 DIAGNOSIS — R079 Chest pain, unspecified: Secondary | ICD-10-CM

## 2017-05-04 DIAGNOSIS — R9431 Abnormal electrocardiogram [ECG] [EKG]: Secondary | ICD-10-CM

## 2017-05-04 NOTE — Patient Instructions (Addendum)
Medication Instructions:  Your physician recommends that you continue on your current medications as directed. Please refer to the Current Medication list given to you today.  Labwork: None ordered.  Testing/Procedures: Dr. Ladona Ridgelaylor recommends you have a loop recorder placed.  Your physician has requested that you have an exercise tolerance test. For further information please visit [email or web address]. Please also follow instruction sheet, as given.  Please schedule for an exercise stress test convenient for patient.  Follow-Up: You will follow up with the device clinic 10-14 days after your procedure for a wound check. You will follow up with Dr. Ladona Ridgelaylor 91 days after your procedure.  Any Other Special Instructions Will Be Listed Below (If Applicable).  [facility] at:  6:30 am on May 08, 2017.   You can eat breakfast and take your normal morning medications.  You may drive yourself home.    If you need a refill on your cardiac medications before your next appointment, please call your pharmacy.   Implantable Loop Recorder Placement An implantable loop recorder is a small electronic device that is placed under the skin of your chest. It is about the size of an AA ("double A") battery. The device records the electrical activity of your heart over a long period of time. Your health care provider can download these recordings to monitor your heart. You may need an implantable loop recorder if you have periods of abnormal heart activity (arrhythmias) or unexplained fainting (syncope) caused by a heart problem. Tell a health care provider about:  Any allergies you have.  All medicines you are taking, including vitamins, herbs, eye drops, creams, and over-the-counter medicines.  Any problems you or family members have had with anesthetic medicines.  Any blood disorders you have.  Any surgeries you have had.  Any medical  conditions you have.  Whether you are pregnant or may be pregnant. What are the risks? Generally, this is a safe procedure. However, as with any procedure, problems may occur, including:  Infection.  Bleeding.  Allergic reactions to anesthetic medicines.  Damage to nerves or blood vessels.  Failure of the device to work. This could require another surgery to replace it.  What happens before the procedure?   You may have a physical exam, blood tests, and imaging tests of your heart, such as a chest X-ray.  Follow instructions from your health care provider about eating or drinking restrictions.  Ask your health care provider about: ? Changing or stopping your regular medicines. This is especially important if you are taking diabetes medicines or blood thinners. ? Taking medicines such as aspirin and ibuprofen. These medicines can thin your blood. Do not take these medicines before your procedure if your surgeon instructs you not to.  Ask your health care provider how your surgical site will be marked or identified.  You may be given antibiotic medicine to help prevent infection.  Plan to have someone take you home after the procedure.  If you will be going home right after the procedure, plan to have someone with you for 24 hours.  Do not use any tobacco products, such as cigarettes, chewing tobacco, and e-cigarettes as told by your surgeon. If you need help quitting, ask your health care provider. What happens during the procedure?  To reduce your risk of infection: ? Your health care team will wash or sanitize their hands. ? Your skin will be washed with soap.  An IV tube will  be inserted into one of your veins.  You may be given an antibiotic medicine through the IV tube.  You may be given one or more of the following: ? A medicine to help you relax (sedative). ? A medicine to numb the area (local anesthetic).  A small cut (incision) will be made on the left side  of your upper chest.  A pocket will be created under your skin.  The device will be placed in the pocket.  The incision will be closed with stitches (sutures) or adhesive strips.  A bandage (dressing) will be placed over the incision. The procedure may vary among health care providers and hospitals. What happens after the procedure?  Your blood pressure, heart rate, breathing rate, and blood oxygen level will be monitored often until the medicines you were given have worn off.  You may be able to go home on the day of your surgery. Before going home: ? Your health care provider will program your recorder. ? You will learn how to trigger your device with a handheld activator. ? You will learn how to send recordings to your health care provider. ? You will get an ID card for your device, and you will be told when to use it.  Do not drive for 24 hours if you received a sedative. This information is not intended to replace advice given to you by your health care provider. Make sure you discuss any questions you have with your health care provider. Document Released: 03/09/2015 Document Revised: 09/03/2015 Document Reviewed: 12/31/2014 Elsevier Interactive Patient Education  [employer].

## 2017-05-04 NOTE — H&P (View-Only) (Signed)
HPI Heather Carey is referred today for evaluation of SVT. She is a pleasant 67 yo woman with a remote cryptogenic stroke. She also has had a TIA. She has had a multitude of cardiac symptoms. She was seen in the ER where she was noted to have SVT which initially did not break with IV adenosine. Vagal maneuvers did result in termination of SVT. She has episodes of sob for which it is unclear whether or not they are related to SVT. Finally the patient has a h/o syncope. The etiology is unclear. She was scheduled to have an ILR placed but had to cancel due to a family emergency.  Allergies  Allergen Reactions  . Azithromycin Anaphylaxis  . Beef-Derived Products     Throat swelling  . Chicken Allergy     Tongue swelling   . Codeine Shortness Of Breath  . Eggs Or Egg-Derived Products     ITCHING AND SWELLING OF LIPS  . Ivp Dye [Iodinated Diagnostic Agents] Anaphylaxis  . Natamycin Other (See Comments)    Throat Swelling  . Other Anaphylaxis, Hives, Swelling and Other (See Comments)    Irregular heart beat Triple Antibiotic Ointment Nuts - Anaphylaxis Ginger - Hives Berries - itching and swelling   . Rose Hives  . Shellfish Allergy Anaphylaxis  . Thimerosol [Thimerosal] Anaphylaxis  . Strawberry Extract Swelling    Pt reports throat swelling and itching with any type of berry  . Adhesive [Tape] Hives    Paper tape is ok  . Avelox [Moxifloxacin Hcl In Nacl]     Irregular heart beat  . Bacitracin Itching and Other (See Comments)    Blisters on skin   . Benzalkonium Chloride Other (See Comments)    unknown  . Cinnamon Hives  . Ginger Swelling  . Neomycin Hives  . Onion     MOUTH SWELLING  . Sunscreens [Aminobenzoate] Hives and Swelling    Avobenzene and Oxybenzene  . Watermelon [Citrullus Vulgaris]     ITCHY MOUTH, LIP SWELLING  . Cephalosporins Hives and Rash  . Penicillins Hives     Current Outpatient Medications  Medication Sig Dispense Refill  . Diphenhydramine  Cit-Pseudoeph (BENADRYL ALLERGY/COLD FASTMELT PO) Take 25 mg by mouth every 6 (six) hours as needed (allergic reaction).    Marland Kitchen EPINEPHrine 0.3 mg/0.3 mL IJ SOAJ injection Inject 0.3 mLs (0.3 mg total) into the muscle once. 2 Device 2  . famotidine (PEPCID) 20 MG tablet Take by mouth as directed.    . metoprolol succinate (TOPROL-XL) 25 MG 24 hr tablet Take 0.5 tablets (12.5 mg total) by mouth 2 (two) times daily. 30 tablet 3  . PROAIR HFA 108 (90 Base) MCG/ACT inhaler Inhale 2 puffs into the lungs every 6 (six) hours as needed for shortness of breath.  0  . SYNTHROID 125 MCG tablet TAKE 1 TABLET BY MOUTH EVERY DAY IN THE MORNING ON EMPTY STOMACH  0   No current facility-administered medications for this visit.      Past Medical History:  Diagnosis Date  . Diverticulitis   . Eosinophilic esophagitis   . Fibromyalgia   . GERD (gastroesophageal reflux disease)   . Hyperlipidemia   . Hypertension   . Hypothyroidism   . IBS (irritable bowel syndrome)   . Migraines   . Mitral regurgitation 02/28/2014   Mild to moderate by echo 02/2014  . Obesity   . Osteoarthritis   . Otitis externa   . Palpitations   . TIA (transient  ischemic attack)     ROS:   All systems reviewed and negative except as noted in the HPI.   Past Surgical History:  Procedure Laterality Date  . ABDOMINAL HYSTERECTOMY    . carpel tunnel    . lbp gso    . TONSILLECTOMY AND ADENOIDECTOMY       Family History  Problem Relation Age of Onset  . Allergic rhinitis Mother   . Urticaria Mother   . Allergic rhinitis Father   . Asthma Father   . Allergic rhinitis Sister   . Angioedema Sister   . Urticaria Sister   . Allergic rhinitis Brother   . Asthma Brother      Social History   Socioeconomic History  . Marital status: Married    Spouse name: Not on file  . Number of children: Not on file  . Years of education: Not on file  . Highest education level: Not on file  Social Needs  . Financial resource  strain: Not on file  . Food insecurity - worry: Not on file  . Food insecurity - inability: Not on file  . Transportation needs - medical: Not on file  . Transportation needs - non-medical: Not on file  Occupational History  . Not on file  Tobacco Use  . Smoking status: Never Smoker  . Smokeless tobacco: Never Used  Substance and Sexual Activity  . Alcohol use: Yes    Alcohol/week: 0.6 oz    Types: 1 Glasses of wine per week  . Drug use: No  . Sexual activity: Yes  Other Topics Concern  . Not on file  Social History Narrative  . Not on file     BP 98/78   Pulse 76   Resp 16   Ht 5' 5.5" (1.664 m)   Wt 160 lb 12.8 oz (72.9 kg)   SpO2 98%   BMI 26.35 kg/m   Physical Exam:  Well appearing 67 yo woman, NAD HEENT: Unremarkable Neck:  6 cm JVD, no thyromegally Lymphatics:  No adenopathy Back:  No CVA tenderness Lungs:  Clear with no wheezes HEART:  Regular rate rhythm, no murmurs, no rubs, no clicks Abd:  soft, positive bowel sounds, no organomegally, no rebound, no guarding Ext:  2 plus pulses, no edema, no cyanosis, no clubbing Skin:  No rashes no nodules Neuro:  CN II through XII intact, motor grossly intact  EKG - reviewed NSR with no ventricular pre-excitation.  Assess/Plan: 1. SVT - the mechanism is unclear. I have recommended she undergo ILR insertion to see how much SVT she is actually having as it appears that she is not always symptomatic. 2. Syncope - the etiology is unclear although I suspect she is having post termination pauses from either SVT or another atrial arrhythmia 3. Sob - etiology is unclear. She has not had a stress test in over 3 years. She has a strong family h/o CAD. In additon she also had atypical chest pressure. 4. Cryptogenic stroke - She will undergo insertion of an ILR.  Heather [facility],M.D.

## 2017-05-04 NOTE — Progress Notes (Signed)
HPI Heather Carey is referred today for evaluation of SVT. She is a pleasant 67 yo woman with a remote cryptogenic stroke. She also has had a TIA. She has had a multitude of cardiac symptoms. She was seen in the ER where she was noted to have SVT which initially did not break with IV adenosine. Vagal maneuvers did result in termination of SVT. She has episodes of sob for which it is unclear whether or not they are related to SVT. Finally the patient has a h/o syncope. The etiology is unclear. She was scheduled to have an ILR placed but had to cancel due to a family emergency.  Allergies  Allergen Reactions  . Azithromycin Anaphylaxis  . Beef-Derived Products     Throat swelling  . Chicken Allergy     Tongue swelling   . Codeine Shortness Of Breath  . Eggs Or Egg-Derived Products     ITCHING AND SWELLING OF LIPS  . Ivp Dye [Iodinated Diagnostic Agents] Anaphylaxis  . Natamycin Other (See Comments)    Throat Swelling  . Other Anaphylaxis, Hives, Swelling and Other (See Comments)    Irregular heart beat Triple Antibiotic Ointment Nuts - Anaphylaxis Ginger - Hives Berries - itching and swelling   . Rose Hives  . Shellfish Allergy Anaphylaxis  . Thimerosol [Thimerosal] Anaphylaxis  . Strawberry Extract Swelling    Pt reports throat swelling and itching with any type of berry  . Adhesive [Tape] Hives    Paper tape is ok  . Avelox [Moxifloxacin Hcl In Nacl]     Irregular heart beat  . Bacitracin Itching and Other (See Comments)    Blisters on skin   . Benzalkonium Chloride Other (See Comments)    unknown  . Cinnamon Hives  . Ginger Swelling  . Neomycin Hives  . Onion     MOUTH SWELLING  . Sunscreens [Aminobenzoate] Hives and Swelling    Avobenzene and Oxybenzene  . Watermelon [Citrullus Vulgaris]     ITCHY MOUTH, LIP SWELLING  . Cephalosporins Hives and Rash  . Penicillins Hives     Current Outpatient Medications  Medication Sig Dispense Refill  . Diphenhydramine  Cit-Pseudoeph (BENADRYL ALLERGY/COLD FASTMELT PO) Take 25 mg by mouth every 6 (six) hours as needed (allergic reaction).    Marland Kitchen EPINEPHrine 0.3 mg/0.3 mL IJ SOAJ injection Inject 0.3 mLs (0.3 mg total) into the muscle once. 2 Device 2  . famotidine (PEPCID) 20 MG tablet Take by mouth as directed.    . metoprolol succinate (TOPROL-XL) 25 MG 24 hr tablet Take 0.5 tablets (12.5 mg total) by mouth 2 (two) times daily. 30 tablet 3  . PROAIR HFA 108 (90 Base) MCG/ACT inhaler Inhale 2 puffs into the lungs every 6 (six) hours as needed for shortness of breath.  0  . SYNTHROID 125 MCG tablet TAKE 1 TABLET BY MOUTH EVERY DAY IN THE MORNING ON EMPTY STOMACH  0   No current facility-administered medications for this visit.      Past Medical History:  Diagnosis Date  . Diverticulitis   . Eosinophilic esophagitis   . Fibromyalgia   . GERD (gastroesophageal reflux disease)   . Hyperlipidemia   . Hypertension   . Hypothyroidism   . IBS (irritable bowel syndrome)   . Migraines   . Mitral regurgitation 02/28/2014   Mild to moderate by echo 02/2014  . Obesity   . Osteoarthritis   . Otitis externa   . Palpitations   . TIA (transient  ischemic attack)     ROS:   All systems reviewed and negative except as noted in the HPI.   Past Surgical History:  Procedure Laterality Date  . ABDOMINAL HYSTERECTOMY    . carpel tunnel    . lbp gso    . TONSILLECTOMY AND ADENOIDECTOMY       Family History  Problem Relation Age of Onset  . Allergic rhinitis Mother   . Urticaria Mother   . Allergic rhinitis Father   . Asthma Father   . Allergic rhinitis Sister   . Angioedema Sister   . Urticaria Sister   . Allergic rhinitis Brother   . Asthma Brother      Social History   Socioeconomic History  . Marital status: Married    Spouse name: Not on file  . Number of children: Not on file  . Years of education: Not on file  . Highest education level: Not on file  Social Needs  . Financial resource  strain: Not on file  . Food insecurity - worry: Not on file  . Food insecurity - inability: Not on file  . Transportation needs - medical: Not on file  . Transportation needs - non-medical: Not on file  Occupational History  . Not on file  Tobacco Use  . Smoking status: Never Smoker  . Smokeless tobacco: Never Used  Substance and Sexual Activity  . Alcohol use: Yes    Alcohol/week: 0.6 oz    Types: 1 Glasses of wine per week  . Drug use: No  . Sexual activity: Yes  Other Topics Concern  . Not on file  Social History Narrative  . Not on file     BP 98/78   Pulse 76   Resp 16   Ht 5' 5.5" (1.664 m)   Wt 160 lb 12.8 oz (72.9 kg)   SpO2 98%   BMI 26.35 kg/m   Physical Exam:  Well appearing 67 yo woman, NAD HEENT: Unremarkable Neck:  6 cm JVD, no thyromegally Lymphatics:  No adenopathy Back:  No CVA tenderness Lungs:  Clear with no wheezes HEART:  Regular rate rhythm, no murmurs, no rubs, no clicks Abd:  soft, positive bowel sounds, no organomegally, no rebound, no guarding Ext:  2 plus pulses, no edema, no cyanosis, no clubbing Skin:  No rashes no nodules Neuro:  CN II through XII intact, motor grossly intact  EKG - reviewed NSR with no ventricular pre-excitation.  Assess/Plan: 1. SVT - the mechanism is unclear. I have recommended she undergo ILR insertion to see how much SVT she is actually having as it appears that she is not always symptomatic. 2. Syncope - the etiology is unclear although I suspect she is having post termination pauses from either SVT or another atrial arrhythmia 3. Sob - etiology is unclear. She has not had a stress test in over 3 years. She has a strong family h/o CAD. In additon she also had atypical chest pressure. 4. Cryptogenic stroke - She will undergo insertion of an ILR.  Heather Carey,M.D.

## 2017-05-08 ENCOUNTER — Ambulatory Visit (HOSPITAL_COMMUNITY)
Admission: RE | Admit: 2017-05-08 | Discharge: 2017-05-08 | Disposition: A | Discharge status: Home / Self Care | Payer: PRIVATE HEALTH INSURANCE | Source: Ambulatory Visit | Attending: Internal Medicine | Admitting: Internal Medicine

## 2017-05-08 ENCOUNTER — Encounter (HOSPITAL_COMMUNITY)
Admission: RE | Disposition: A | Discharge status: Home / Self Care | Payer: Self-pay | Source: Ambulatory Visit | Attending: Internal Medicine

## 2017-05-08 ENCOUNTER — Encounter (HOSPITAL_COMMUNITY): Payer: Self-pay | Admitting: Internal Medicine

## 2017-05-08 DIAGNOSIS — Z79890 Hormone replacement therapy (postmenopausal): Secondary | ICD-10-CM | POA: Diagnosis not present

## 2017-05-08 DIAGNOSIS — E669 Obesity, unspecified: Secondary | ICD-10-CM | POA: Diagnosis not present

## 2017-05-08 DIAGNOSIS — Z8673 Personal history of transient ischemic attack (TIA), and cerebral infarction without residual deficits: Secondary | ICD-10-CM | POA: Insufficient documentation

## 2017-05-08 DIAGNOSIS — I471 Supraventricular tachycardia: Secondary | ICD-10-CM | POA: Diagnosis not present

## 2017-05-08 DIAGNOSIS — Z6826 Body mass index (BMI) 26.0-26.9, adult: Secondary | ICD-10-CM | POA: Insufficient documentation

## 2017-05-08 DIAGNOSIS — Z79899 Other long term (current) drug therapy: Secondary | ICD-10-CM | POA: Insufficient documentation

## 2017-05-08 DIAGNOSIS — M797 Fibromyalgia: Secondary | ICD-10-CM | POA: Diagnosis not present

## 2017-05-08 DIAGNOSIS — E039 Hypothyroidism, unspecified: Secondary | ICD-10-CM | POA: Diagnosis not present

## 2017-05-08 DIAGNOSIS — Z8249 Family history of ischemic heart disease and other diseases of the circulatory system: Secondary | ICD-10-CM | POA: Insufficient documentation

## 2017-05-08 DIAGNOSIS — K219 Gastro-esophageal reflux disease without esophagitis: Secondary | ICD-10-CM | POA: Diagnosis not present

## 2017-05-08 DIAGNOSIS — R0602 Shortness of breath: Secondary | ICD-10-CM | POA: Diagnosis not present

## 2017-05-08 DIAGNOSIS — I6389 Other cerebral infarction: Secondary | ICD-10-CM | POA: Diagnosis not present

## 2017-05-08 DIAGNOSIS — I10 Essential (primary) hypertension: Secondary | ICD-10-CM | POA: Insufficient documentation

## 2017-05-08 HISTORY — PX: LOOP RECORDER INSERTION: EP1214

## 2017-05-08 SURGERY — LOOP RECORDER INSERTION

## 2017-05-08 MED ORDER — ACETAMINOPHEN 325 MG PO TABS: 325.0000 mg | ORAL_TABLET | ORAL | Status: DC | PRN

## 2017-05-08 MED ORDER — LIDOCAINE-EPINEPHRINE 1 %-1:100000 IJ SOLN
INTRAMUSCULAR | Status: AC
  Filled 2017-05-08: qty 1

## 2017-05-08 MED ORDER — ONDANSETRON HCL 4 MG/2ML IJ SOLN: 4.0000 mg | Freq: Four times a day (QID) | INTRAMUSCULAR | Status: DC | PRN

## 2017-05-08 MED ORDER — LIDOCAINE-EPINEPHRINE 1 %-1:100000 IJ SOLN
INTRAMUSCULAR | Status: DC | PRN
  Administered 2017-05-08: 30 mL

## 2017-05-08 SURGICAL SUPPLY — 2 items
LOOP REVEAL LINQSYS (Prosthesis & Implant Heart) ×2 IMPLANT
PACK LOOP INSERTION (CUSTOM PROCEDURE TRAY) ×2 IMPLANT

## 2017-05-08 NOTE — Interval H&P Note (Signed)
History and Physical Interval Note:  05/08/2017 7:03 AM  Heather IgoRobin Brecheen  has presented today for surgery, with the diagnosis of Svt, palpitations  The various methods of treatment have been discussed with the patient and family. After consideration of risks, benefits and other options for treatment, the patient has consented to  Procedure(s): LOOP RECORDER INSERTION (N/A) as a surgical intervention .  The patient's history has been reviewed, patient examined, no change in status, stable for surgery.  I have reviewed the patient's chart and labs.  Questions were answered to the patient's satisfaction.     Lewayne BuntingGregg Sangeeta Youse

## 2017-05-11 ENCOUNTER — Ambulatory Visit (INDEPENDENT_AMBULATORY_CARE_PROVIDER_SITE_OTHER): Payer: PRIVATE HEALTH INSURANCE

## 2017-05-11 DIAGNOSIS — R9431 Abnormal electrocardiogram [ECG] [EKG]: Secondary | ICD-10-CM | POA: Diagnosis not present

## 2017-05-11 DIAGNOSIS — R079 Chest pain, unspecified: Secondary | ICD-10-CM | POA: Diagnosis not present

## 2017-05-11 LAB — EXERCISE TOLERANCE TEST
Estimated workload: 7.2 METS
Exercise duration (min): 6 min
Exercise duration (sec): 10 s
MPHR: 156 {beats}/min
Peak HR: 142 {beats}/min
Percent HR: 91 %
RPE: 16
Rest HR: 74 {beats}/min

## 2017-05-18 ENCOUNTER — Ambulatory Visit (INDEPENDENT_AMBULATORY_CARE_PROVIDER_SITE_OTHER): Payer: Self-pay | Admitting: *Deleted

## 2017-05-18 DIAGNOSIS — I639 Cerebral infarction, unspecified: Secondary | ICD-10-CM

## 2017-05-19 ENCOUNTER — Telehealth: Payer: Self-pay | Admitting: *Deleted

## 2017-05-19 NOTE — Telephone Encounter (Signed)
LMOVM (DPR) requesting that patient send a manual transmission from [facility] home monitor.  Gave Device Clinic phone number for questions/concerns.  Received alert for "tachy" episode but no ECG available.  Will review when transmission received.

## 2017-05-20 LAB — CUP PACEART INCLINIC DEVICE CHECK
Date Time Interrogation Session: 20190207142255
Implantable Pulse Generator Implant Date: 20190129

## 2017-05-20 NOTE — Progress Notes (Signed)
Wound check appointment. Steri-strips removed. Wound without redness or edema. Incision edges approximated, wound well healed. Battery status: Good. R-waves 0.45 mV. 1 tachy episode-- ECG appears SVT, duration 24 seconds, Max V rate 176 bpm, patient states she had slight palpitations after drinking cup of coffee. 0 symptom episodes, 0 pause episodes, 0 brady episodes. 0 AF episodes (0% burden). Monthly summary reports and ROV with GT 08/11/17.

## 2017-05-22 NOTE — Telephone Encounter (Signed)
Manual transmission received on 05/19/17.  "Tachy" episode is from 05/18/17 previously reviewed at wound check appointment that day.

## 2017-06-07 ENCOUNTER — Ambulatory Visit (INDEPENDENT_AMBULATORY_CARE_PROVIDER_SITE_OTHER): Payer: PRIVATE HEALTH INSURANCE | Admitting: *Deleted

## 2017-06-07 DIAGNOSIS — I639 Cerebral infarction, unspecified: Secondary | ICD-10-CM

## 2017-06-07 NOTE — Progress Notes (Signed)
[facility] Summary Report / Loop Recorder 

## 2017-07-10 ENCOUNTER — Ambulatory Visit (INDEPENDENT_AMBULATORY_CARE_PROVIDER_SITE_OTHER): Payer: Medicare Other | Admitting: *Deleted

## 2017-07-10 DIAGNOSIS — I639 Cerebral infarction, unspecified: Secondary | ICD-10-CM | POA: Diagnosis not present

## 2017-07-11 NOTE — Progress Notes (Signed)
[facility] Summary Reprot / Loop Recorder 

## 2017-07-12 LAB — CUP PACEART REMOTE DEVICE CHECK
Date Time Interrogation Session: 20190227144407
Implantable Pulse Generator Implant Date: 20190129

## 2017-08-01 ENCOUNTER — Telehealth: Payer: Self-pay | Admitting: *Deleted

## 2017-08-01 ENCOUNTER — Encounter: Payer: Self-pay | Admitting: Internal Medicine

## 2017-08-01 NOTE — Telephone Encounter (Signed)
Spoke with patient regarding 4 tachy episodes noted on LINQ from 4/17 and 4/21, longest ~16sec.  ECGs appear SVT.  Patient reports she has been under more stress than usual as she has been packing and moving.  She reports palpitations and presyncope during episodes, but was able to lay down and put her feet up until symptoms resolved.  She does admit to forgetting to take her metoprolol and synthroid for a few days due to the move.  Encouraged compliance with medications.  Advised that Dr. Ladona Ridgelaylor will review episodes and we will call her back if any additional recommendations prior to f/u appointment with Dr. Ladona Ridgelaylor on 08/11/17.  Patient is agreeable and appreciative of call.  ECGs printed and placed in Dr. Valarie Conesaylor's LINQ folder for review.

## 2017-08-11 ENCOUNTER — Ambulatory Visit: Payer: Medicare Other | Admitting: Internal Medicine

## 2017-08-11 ENCOUNTER — Encounter: Payer: Self-pay | Admitting: Internal Medicine

## 2017-08-11 VITALS — BP 116/76 | HR 70 | Ht 65.0 in | Wt 163.4 lb

## 2017-08-11 DIAGNOSIS — I471 Supraventricular tachycardia: Secondary | ICD-10-CM | POA: Diagnosis not present

## 2017-08-11 DIAGNOSIS — R002 Palpitations: Secondary | ICD-10-CM

## 2017-08-11 LAB — CUP PACEART INCLINIC DEVICE CHECK
Date Time Interrogation Session: 20190503094551
Implantable Pulse Generator Implant Date: 20190129

## 2017-08-11 NOTE — Progress Notes (Signed)
HPI Heather Carey returns today for followup. She is a pleasant 67 yo woman with SVT and syncope, s/p insertion of an ILR a couple of months ago. She has chronic dyspnea. She also has a h/o cryptogenic stroke. In the interim, she has done well although she and her husband are moving out of there parsonage and into a condo. She denies chest pain or sob. She c/o palpitations. They awaken her and last seconds.  Allergies  Allergen Reactions  . Azithromycin Anaphylaxis  . Codeine Shortness Of Breath  . Ivp Dye [Iodinated Diagnostic Agents] Anaphylaxis  . Natamycin Other (See Comments)    Throat Swelling  . Other Anaphylaxis, Hives, Swelling and Other (See Comments)    Irregular heart beat Triple Antibiotic Ointment Nuts - Anaphylaxis  Berries - itching and swelling   . Rose Hives  . Shellfish Allergy Anaphylaxis  . Thimerosol [Thimerosal] Anaphylaxis  . Strawberry Extract Swelling    Pt reports throat swelling and itching with any type of berry  . Adhesive [Tape] Hives    Paper tape is ok  . Avelox [Moxifloxacin Hcl In Nacl]     Irregular heart beat  . Bacitracin Hives and Itching  . Cinnamon Hives  . Ginger Hives and Swelling  . Neomycin Hives  . Onion     MOUTH SWELLING  . Sunscreens [Aminobenzoate] Hives and Swelling    Avobenzene and Oxybenzene  . Watermelon [Citrullus Vulgaris]     Tongue swelling   . Benzalkonium Chloride Rash  . Cephalosporins Hives and Rash  . Penicillins     Childhood allergy Has patient had a PCN reaction causing immediate rash, facial/tongue/throat swelling, SOB or lightheadedness with hypotension: Unknown Has patient had a PCN reaction causing severe rash involving mucus membranes or skin necrosis: Unknown Has patient had a PCN reaction that required hospitalization: No Has patient had a PCN reaction occurring within the last 10 years: No If all of the above answers are "NO", then may proceed with Cephalosporin use.      Current  Outpatient Medications  Medication Sig Dispense Refill  . diphenhydrAMINE (BENADRYL) 2 % cream Apply 1 application topically daily as needed for itching.    . DiphenhydrAMINE Citrate (BENADRYL ALLERGY FASTMELT PO) Take 25 mg by mouth every 8 (eight) hours as needed (allergies and itching).    . EPINEPHrine 0.3 mg/0.3 mL IJ SOAJ injection Inject 0.3 mLs (0.3 mg total) into the muscle once. 2 Device 2  . famotidine (PEPCID) 20 MG tablet Take 20 mg by mouth daily as needed for heartburn.     . metoprolol succinate (TOPROL-XL) 25 MG 24 hr tablet Take 0.5 tablets (12.5 mg total) by mouth 2 (two) times daily. 30 tablet 3  . PROAIR HFA 108 (90 Base) MCG/ACT inhaler Inhale 2 puffs into the lungs every 6 (six) hours as needed for shortness of breath.  0  . SYNTHROID 125 MCG tablet Take 125 mcg once daily in the morning before breakfast  0   No current facility-administered medications for this visit.      Past Medical History:  Diagnosis Date  . Diverticulitis   . Eosinophilic esophagitis   . Fibromyalgia   . GERD (gastroesophageal reflux disease)   . Hyperlipidemia   . Hypertension   . Hypothyroidism   . IBS (irritable bowel syndrome)   . Migraines   . Mitral regurgitation 02/28/2014   Mild to moderate by echo 02/2014  . Obesity   . Osteoarthritis   .  Otitis externa   . Palpitations   . TIA (transient ischemic attack)     ROS:   All systems reviewed and negative except as noted in the HPI.   Past Surgical History:  Procedure Laterality Date  . ABDOMINAL HYSTERECTOMY    . carpel tunnel    . lbp gso    . LOOP RECORDER INSERTION N/A 05/08/2017   Procedure: LOOP RECORDER INSERTION;  Surgeon: Marinus Maw, MD;  Location[facility] INVASIVE CV LAB;  Service: Cardiovascular;  Laterality: N/A;  . TONSILLECTOMY AND ADENOIDECTOMY       Family History  Problem Relation Age of Onset  . Allergic rhinitis Mother   . Urticaria Mother   . Allergic rhinitis Father   . Asthma Father   .  Allergic rhinitis Sister   . Angioedema Sister   . Urticaria Sister   . Allergic rhinitis Brother   . Asthma Brother      Social History   Socioeconomic History  . Marital status: Married    Spouse name: Not on file  . Number of children: Not on file  . Years of education: Not on file  . Highest education level: Not on file  Occupational History  . Not on file  Social Needs  . Financial resource strain: Not on file  . Food insecurity:    Worry: Not on file    Inability: Not on file  . Transportation needs:    Medical: Not on file    Non-medical: Not on file  Tobacco Use  . Smoking status: Never Smoker  . Smokeless tobacco: Never Used  Substance and Sexual Activity  . Alcohol use: Yes    Alcohol/week: 0.6 oz    Types: 1 Glasses of wine per week  . Drug use: No  . Sexual activity: Yes  Lifestyle  . Physical activity:    Days per week: Not on file    Minutes per session: Not on file  . Stress: Not on file  Relationships  . Social connections:    Talks on phone: Not on file    Gets together: Not on file    Attends religious service: Not on file    Active member of club or organization: Not on file    Attends meetings of clubs or organizations: Not on file    Relationship status: Not on file  . Intimate partner violence:    Fear of current or ex partner: Not on file    Emotionally abused: Not on file    Physically abused: Not on file    Forced sexual activity: Not on file  Other Topics Concern  . Not on file  Social History Narrative  . Not on file     Ht  (1.651 m)   Wt 163 lb 6.4 oz (74.1 kg)   BMI 27.19 kg/m   Physical Exam:  Well appearing 67 yo woman, NAD HEENT: Unremarkable Neck:  6 cm JVD, no thyromegally Lymphatics:  No adenopathy Back:  No CVA tenderness Lungs:  Clear with no wheezes HEART:  Regular rate rhythm, no murmurs, no rubs, no clicks Abd:  soft, positive bowel sounds, no organomegally, no rebound, no guarding Ext:  2 plus  pulses, no edema, no cyanosis, no clubbing Skin:  No rashes no nodules Neuro:  CN II through XII intact, motor grossly intact  EKG - nsr  DEVICE  Normal device function.  See PaceArt for details. She has had several episodes of SVT  Assess/Plan: 1. SVT -  her symptoms are self limited but occurring more frequently. I encouraged her to take an extra half of toprol at night before going to bed as needed. 2. Cryptogenic stroke - she has had no atrial fib on ILR interogation.  3. Syncope - she has not had any and will undergo watchful waiting.  Leonia Reeves.D.

## 2017-08-11 NOTE — Patient Instructions (Addendum)
Medication Instructions:  Your physician recommends that you continue on your current medications as directed. Please refer to the Current Medication list given to you today.  Labwork: None ordered.  Testing/Procedures: None ordered.  Follow-Up: Your physician wants you to follow-up in: 6 months with Dr. Ladona Ridgel.   You will receive a reminder letter in the mail two months in advance. If you don't receive a letter, please call our office to schedule the follow-up appointment.  If you are feeling ok you can see [place] after a year.  Any Other Special Instructions Will Be Listed Below (If Applicable).  If you need a refill on your cardiac medications before your next appointment, please call your pharmacy.

## 2017-08-14 ENCOUNTER — Ambulatory Visit (INDEPENDENT_AMBULATORY_CARE_PROVIDER_SITE_OTHER): Payer: Medicare Other | Admitting: *Deleted

## 2017-08-14 DIAGNOSIS — I639 Cerebral infarction, unspecified: Secondary | ICD-10-CM | POA: Diagnosis not present

## 2017-08-14 LAB — CUP PACEART REMOTE DEVICE CHECK
Date Time Interrogation Session: 20190401154030
Implantable Pulse Generator Implant Date: 20190129

## 2017-08-14 NOTE — Progress Notes (Signed)
[facility] Summary Report / Loop Recorder 

## 2017-08-15 ENCOUNTER — Telehealth: Payer: Self-pay | Admitting: Cardiology

## 2017-08-15 NOTE — Telephone Encounter (Signed)
LMOVM requesting that pt send manual transmission b/c home monitor has not updated in at least 14 days.    

## 2017-09-05 LAB — CUP PACEART REMOTE DEVICE CHECK
Date Time Interrogation Session: 20190504160555
Implantable Pulse Generator Implant Date: 20190129

## 2017-09-08 ENCOUNTER — Other Ambulatory Visit: Payer: Self-pay | Admitting: Internal Medicine

## 2017-09-14 ENCOUNTER — Ambulatory Visit (INDEPENDENT_AMBULATORY_CARE_PROVIDER_SITE_OTHER): Payer: Medicare Other | Admitting: *Deleted

## 2017-09-14 DIAGNOSIS — I639 Cerebral infarction, unspecified: Secondary | ICD-10-CM

## 2017-09-14 NOTE — Progress Notes (Signed)
[facility] Summary Report / Loop Recorder 

## 2017-09-28 ENCOUNTER — Other Ambulatory Visit: Payer: Self-pay | Admitting: Internal Medicine

## 2017-10-10 ENCOUNTER — Telehealth: Payer: Self-pay

## 2017-10-10 ENCOUNTER — Telehealth: Payer: Self-pay | Admitting: Internal Medicine

## 2017-10-10 NOTE — Telephone Encounter (Signed)
New Message     1. Has your device fired? no  2. Is you device beeping? no  3. Are you experiencing draining or swelling at device site? no  4. Are you calling to see if we received your device transmission? No   5. Have you passed out? No   Patient is calling because at night she says she is having hard beats and it waking her up constantly. Please call to discuss.   Please route to Device Clinic Pool

## 2017-10-10 NOTE — Telephone Encounter (Signed)
error 

## 2017-10-10 NOTE — Telephone Encounter (Signed)
Lets continue to monitor ILR and try and correlate symptoms to arrhythmias. GT

## 2017-10-10 NOTE — Telephone Encounter (Signed)
Ms. Hollice EspyGibson calling to see if any episodes have been recorded on LINQ loop recorder. No episodes since 09/11/17- brief tachy episode during the day. She reports that 1:30-4 am every night she is woken up by irregular, fast, hard heart beats while laying down. Rolling over, yawning, subtle movements trigger these episodes. She has difficulty getting back to sleep. It is relieved by sitting or standing. Previously noted sharp right jaw and right arm pain returned last night, chest pressure radiating to back before bed. She reports that she has told Dr. Ladona Ridgelaylor about this before without resolution. ] She is feeling fine today, without pain or palpitations. She says that she just feels exhausted d/t lack of sleep. She is aware that Dr. Ladona Ridgelaylor is back in clinic tomorrow and that she may not hear back until then. She verbalizes understanding.

## 2017-10-11 NOTE — Telephone Encounter (Signed)
I reviewed with Dr. Ladona Ridgelaylor- he recommends increasing salt intake so that she can take her PM dose of metoprolol. Heather Carey verbalizes understanding.

## 2017-10-15 ENCOUNTER — Encounter (HOSPITAL_COMMUNITY): Payer: Self-pay | Admitting: Emergency Medicine

## 2017-10-15 ENCOUNTER — Other Ambulatory Visit: Payer: Self-pay

## 2017-10-15 ENCOUNTER — Emergency Department (HOSPITAL_COMMUNITY)
Admission: EM | Admit: 2017-10-15 | Discharge: 2017-10-15 | Disposition: A | Discharge status: Home / Self Care | Payer: Medicare Other | Attending: Emergency Medicine | Admitting: Emergency Medicine

## 2017-10-15 ENCOUNTER — Emergency Department (HOSPITAL_COMMUNITY): Payer: Medicare Other

## 2017-10-15 DIAGNOSIS — Z8673 Personal history of transient ischemic attack (TIA), and cerebral infarction without residual deficits: Secondary | ICD-10-CM | POA: Insufficient documentation

## 2017-10-15 DIAGNOSIS — R200 Anesthesia of skin: Secondary | ICD-10-CM | POA: Diagnosis not present

## 2017-10-15 DIAGNOSIS — I10 Essential (primary) hypertension: Secondary | ICD-10-CM | POA: Insufficient documentation

## 2017-10-15 DIAGNOSIS — Z79899 Other long term (current) drug therapy: Secondary | ICD-10-CM | POA: Insufficient documentation

## 2017-10-15 DIAGNOSIS — G43109 Migraine with aura, not intractable, without status migrainosus: Secondary | ICD-10-CM | POA: Diagnosis not present

## 2017-10-15 DIAGNOSIS — H538 Other visual disturbances: Secondary | ICD-10-CM | POA: Diagnosis present

## 2017-10-15 DIAGNOSIS — E039 Hypothyroidism, unspecified: Secondary | ICD-10-CM | POA: Diagnosis not present

## 2017-10-15 LAB — COMPREHENSIVE METABOLIC PANEL
ALT: 13 U/L (ref 0–44)
AST: 16 U/L (ref 15–41)
Albumin: 4 g/dL (ref 3.5–5.0)
Alkaline Phosphatase: 48 U/L (ref 38–126)
Anion gap: 6 (ref 5–15)
BUN: 8 mg/dL (ref 8–23)
CO2: 27 mmol/L (ref 22–32)
Calcium: 9.2 mg/dL (ref 8.9–10.3)
Chloride: 106 mmol/L (ref 98–111)
Creatinine, Ser: 0.7 mg/dL (ref 0.44–1.00)
GFR calc Af Amer: >60 mL/min (ref >60)
GFR calc non Af Amer: >60 mL/min (ref >60)
Glucose, Bld: 108 mg/dL — ABNORMAL HIGH (ref 70–99)
Potassium: 3.7 mmol/L (ref 3.5–5.1)
Sodium: 139 mmol/L (ref 135–145)
Total Bilirubin: 0.5 mg/dL (ref 0.3–1.2)
Total Protein: 6.8 g/dL (ref 6.5–8.1)

## 2017-10-15 LAB — I-STAT TROPONIN, ED: Troponin i, poc: 0 ng/mL (ref 0.00–0.08)

## 2017-10-15 LAB — I-STAT CHEM 8, ED
BUN: 8 mg/dL (ref 8–23)
Calcium, Ion: 1.17 mmol/L (ref 1.15–1.40)
Chloride: 104 mmol/L (ref 98–111)
Creatinine, Ser: 0.6 mg/dL (ref 0.44–1.00)
Glucose, Bld: 103 mg/dL — ABNORMAL HIGH (ref 70–99)
HCT: 35 % — ABNORMAL LOW (ref 36.0–46.0)
Hemoglobin: 11.9 g/dL — ABNORMAL LOW (ref 12.0–15.0)
Potassium: 3.7 mmol/L (ref 3.5–5.1)
Sodium: 141 mmol/L (ref 135–145)
TCO2: 25 mmol/L (ref 22–32)

## 2017-10-15 LAB — DIFFERENTIAL
Abs Immature Granulocytes: 0 10*3/uL (ref 0.0–0.1)
Basophils Absolute: 0 10*3/uL (ref 0.0–0.1)
Basophils Relative: 0 %
Eosinophils Absolute: 0.3 10*3/uL (ref 0.0–0.7)
Eosinophils Relative: 4 %
Immature Granulocytes: 0 %
Lymphocytes Relative: 33 %
Lymphs Abs: 2.3 10*3/uL (ref 0.7–4.0)
Monocytes Absolute: 0.4 10*3/uL (ref 0.1–1.0)
Monocytes Relative: 6 %
Neutro Abs: 3.9 10*3/uL (ref 1.7–7.7)
Neutrophils Relative %: 57 %

## 2017-10-15 LAB — CBC
HCT: 36.1 % (ref 36.0–46.0)
Hemoglobin: 12.1 g/dL (ref 12.0–15.0)
MCH: 30.3 pg (ref 26.0–34.0)
MCHC: 33.5 g/dL (ref 30.0–36.0)
MCV: 90.5 fL (ref 78.0–100.0)
Platelets: 205 10*3/uL (ref 150–400)
RBC: 3.99 MIL/uL (ref 3.87–5.11)
RDW: 12.7 % (ref 11.5–15.5)
WBC: 6.9 10*3/uL (ref 4.0–10.5)

## 2017-10-15 LAB — CBG MONITORING, ED: Glucose-Capillary: 109 mg/dL — ABNORMAL HIGH (ref 70–99)

## 2017-10-15 LAB — PROTIME-INR
INR: 1.07
Prothrombin Time: 13.9 seconds (ref 11.4–15.2)

## 2017-10-15 LAB — APTT: aPTT: 28 seconds (ref 24–36)

## 2017-10-15 MED ORDER — PROCHLORPERAZINE EDISYLATE 10 MG/2ML IJ SOLN
5.0000 mg | Freq: Once | INTRAMUSCULAR | Status: DC
Start: 2017-10-15 — End: 2017-10-15
  Filled 2017-10-15: qty 2

## 2017-10-15 MED ORDER — PROMETHAZINE HCL 25 MG/ML IJ SOLN
12.5000 mg | Freq: Once | INTRAMUSCULAR | Status: AC
  Administered 2017-10-15: 12.5 mg via INTRAVENOUS
  Filled 2017-10-15: qty 1

## 2017-10-15 MED ORDER — SODIUM CHLORIDE 0.9 % IV BOLUS
1000.0000 mL | Freq: Once | INTRAVENOUS | Status: AC
  Administered 2017-10-15: 1000 mL via INTRAVENOUS

## 2017-10-15 NOTE — Discharge Instructions (Addendum)
As discussed, your evaluation today has been largely reassuring.  But, it is important that you monitor your condition carefully, and do not hesitate to return to the ED if you develop new, or concerning changes in your condition. ? ?Otherwise, please follow-up with your physician for appropriate ongoing care. ? ?

## 2017-10-15 NOTE — ED Provider Notes (Signed)
[facility] EMERGENCY DEPARTMENT Provider Note   CSN: [id number] Arrival date & time: 10/15/17  1143     History   Chief Complaint Chief Complaint  Patient presents with  . Visual Field Change    HPI Laura Caldas is a 67 y.o. female.  HPI With multiple medical issues presents after an episode of facial paresthesia and vision changes. Episode was about 7 hours ago, patient recalls being outside, walking her dog, felt the sudden onset of numbness in her right face, mouth, and vision changes in the right eye. Vision changes were brief, and have resolved. She is continues to feel some numbness about the right lower face, appreciate some facial asymmetry when looking at herself in the mirror, denies speech changes, extremity weakness, confusion, dissertation, difficulty swallowing or speaking. She is here with her husband who assists with the HPI. Notably, the patient recalls seeing a number of baths around when the episode occurred, but did not feel any pain, nor did she appreciate any puncture wounds subsequently. Patient does have history of prior bad bite received immunoglobulin and vaccine 3 years ago.  Patient has an implanted cardiac loop recorder for SVT as well. She denies using anticoagulant.  Past Medical History:  Diagnosis Date  . Diverticulitis   . Eosinophilic esophagitis   . Fibromyalgia   . GERD (gastroesophageal reflux disease)   . Hyperlipidemia   . Hypertension   . Hypothyroidism   . IBS (irritable bowel syndrome)   . Migraines   . Mitral regurgitation 02/28/2014   Mild to moderate by echo 02/2014  . Obesity   . Osteoarthritis   . Otitis externa   . Palpitations   . TIA (transient ischemic attack)     Patient Active Problem List   Diagnosis Date Noted  . Malnutrition of moderate degree 03/09/2016  . Syncope 03/08/2016  . Sensory disturbance 03/08/2016  . Hypothyroidism 03/08/2016  . Urticaria with associated angioedema  04/27/2015  . Angioedema 04/27/2015  . Allergic reaction 01/07/2015  . Food allergy 01/07/2015  . Mitral regurgitation 02/28/2014  . MVP (mitral valve prolapse) 02/24/2014  . SOB (shortness of breath) 02/24/2014  . Snoring 02/24/2014  . Hypertension   . Hyperlipidemia   . Chest pain 02/10/2014    Past Surgical History:  Procedure Laterality Date  . ABDOMINAL HYSTERECTOMY    . carpel tunnel    . lbp gso    . LOOP RECORDER INSERTION N/A 05/08/2017   Procedure: LOOP RECORDER INSERTION;  Surgeon: Marinus Maw, MD;  Location: [facility] INVASIVE CV LAB;  Service: Cardiovascular;  Laterality: N/A;  . TONSILLECTOMY AND ADENOIDECTOMY       OB History   None      Home Medications    Prior to Admission medications   Medication Sig Start Date End Date Taking? Authorizing Provider  diphenhydrAMINE (BENADRYL) 2 % cream Apply 1 application topically daily as needed for itching.   Yes [provider]  DiphenhydrAMINE Citrate (BENADRYL ALLERGY FASTMELT PO) Take 25 mg by mouth every 8 (eight) hours as needed (allergies and itching).   Yes [provider]  EPINEPHrine 0.3 mg/0.3 mL IJ SOAJ injection Inject 0.3 mLs (0.3 mg total) into the muscle once. 04/27/15  Yes Bobbitt, Heywood Iles, MD  famotidine (PEPCID) 20 MG tablet Take 20 mg by mouth daily as needed for heartburn.    Yes [provider]  metoprolol succinate (TOPROL-XL) 25 MG 24 hr tablet Take 0.5 tablets (12.5 mg total) by mouth 2 (two)  times daily. 04/24/17  Yes Sheilah PigeonUrsuy, Renee Lynn, PA-C  PROAIR HFA 108 [phone](90 Base) MCG/ACT inhaler Inhale 2 puffs into the lungs every 6 (six) hours as needed for shortness of breath. 06/08/16  Yes [provider]  SYNTHROID 125 MCG tablet Take 125 mcg once daily in the morning before breakfast 04/24/17  Yes [provider]    Family History Family History  Problem Relation Age of Onset  . Allergic rhinitis Mother   . Urticaria Mother   . Allergic rhinitis Father     . Asthma Father   . Allergic rhinitis Sister   . Angioedema Sister   . Urticaria Sister   . Allergic rhinitis Brother   . Asthma Brother     Social History Social History   Tobacco Use  . Smoking status: Never Smoker  . Smokeless tobacco: Never Used  Substance Use Topics  . Alcohol use: Yes    Alcohol/week: 0.6 oz    Types: 1 Glasses of wine per week  . Drug use: No     Allergies   Azithromycin; Codeine; Ivp dye [iodinated diagnostic agents]; Natamycin; Other; Rose; Shellfish allergy; Thimerosol [thimerosal]; Strawberry extract; Adhesive [tape]; Avelox [moxifloxacin hcl in nacl]; Bacitracin; Cinnamon; Ginger; Neomycin; Onion; Sunscreens [aminobenzoate]; Watermelon [citrullus vulgaris]; Benzalkonium chloride; Cephalosporins; and Penicillins   Review of Systems Review of Systems  Constitutional:       Per HPI, otherwise negative  HENT:       Per HPI, otherwise negative  Eyes: Positive for visual disturbance.  Respiratory:       Per HPI, otherwise negative  Cardiovascular:       Per HPI, otherwise negative  Gastrointestinal: Negative for vomiting.  Endocrine:       Negative aside from HPI  Genitourinary:       Neg aside from HPI   Musculoskeletal:       Per HPI, otherwise negative  Skin: Negative.   Neurological: Positive for numbness. Negative for syncope.     Physical Exam Updated Vital Signs BP 135/88   Pulse 72   Temp 98.4 F (36.9 C)   Resp 13   SpO2 100%   Physical Exam  Constitutional: She is oriented to person, place, and time. She appears well-developed and well-nourished. No distress.  HENT:  Head: Normocephalic and atraumatic.  Eyes: Conjunctivae and EOM are normal.  Cardiovascular: Normal rate and regular rhythm.  Pulmonary/Chest: Effort normal and breath sounds normal. No stridor. No respiratory distress.  Abdominal: She exhibits no distension.  Musculoskeletal: She exhibits no edema.  Neurological: She is alert and oriented to person,  place, and time. She displays no atrophy and no tremor. No cranial nerve deficit or sensory deficit. She exhibits normal muscle tone. She displays no seizure activity. Coordination normal.  Skin: Skin is warm and dry.  Psychiatric: She has a normal mood and affect.  Nursing note and vitals reviewed.    ED Treatments / Results  Labs (all labs ordered are listed, but only abnormal results are displayed) Labs Reviewed  COMPREHENSIVE METABOLIC PANEL - Abnormal; Notable for the following components:      Result Value   Glucose, Bld 108 (*)    All other components within normal limits  CBG MONITORING, ED - Abnormal; Notable for the following components:   Glucose-Capillary 109 (*)    All other components within normal limits  I-STAT CHEM 8, ED - Abnormal; Notable for the following components:   Glucose, Bld 103 (*)    Hemoglobin 11.9 (*)  HCT 35.0 (*)    All other components within normal limits  PROTIME-INR  APTT  CBC  DIFFERENTIAL  I-STAT TROPONIN, ED    EKG EKG Interpretation  Date/Time:  Sunday October 15 2017 12:01:26 EDT Ventricular Rate:  83 PR Interval:  132 QRS Duration: 78 QT Interval:  370 QTC Calculation: 434 R Axis:   54 Text Interpretation:  Normal sinus rhythm T wave abnormality Artifact Abnormal ekg Confirmed by Gerhard Munch [phone]) on 10/15/2017 1:11:10 PM   Radiology Ct Head Wo Contrast  Result Date: 10/15/2017 CLINICAL DATA:  Sudden onset of RIGHT facial numbness, flashes of light. EXAM: CT HEAD WITHOUT CONTRAST TECHNIQUE: Contiguous axial images were obtained from the base of the skull through the vertex without intravenous contrast. COMPARISON:  MR brain 03/06/2016. FINDINGS: Brain: No evidence for acute infarction, hemorrhage, mass lesion, hydrocephalus, or extra-axial fluid. Normal cerebral volume. No white matter disease. Empty sella. Vascular: No hyperdense vessel or unexpected calcification. Skull: Calvarium intact. Sinuses/Orbits: No acute finding.  Other: None. IMPRESSION: Empty sella, otherwise negative CT head without contrast. This is unlikely to be related to RIGHT facial numbness. Similar appearance to prior MR brain, when technique differences are considered. Electronically Signed   By: Elsie Stain M.D.   On: 10/15/2017 13:58    Procedures Procedures (including critical care time)  Medications Ordered in ED Medications  prochlorperazine (COMPAZINE) injection 5 mg (0 mg Intravenous Hold 10/15/17 1613)  sodium chloride 0.9 % bolus 1,000 mL (0 mLs Intravenous Stopped 10/15/17 1542)  promethazine (PHENERGAN) injection 12.5 mg (12.5 mg Intravenous Given 10/15/17 1417)     Initial Impression / Assessment and Plan / ED Course  I have reviewed the triage vital signs and the nursing notes.  Pertinent labs & imaging results that were available during my care of the patient were reviewed by me and considered in my medical decision making (see chart for details).   Repeat exam patient continues to have symptoms. Discussed her case with our neurology colleagues given her history of prior stroke, migraine, implanted loop recorder, admission against MRI and angiography given her dye contrast allergy.  Update:, Patient in similar condition, ambulatory, but with some ongoing paresthesia  6:45 PM Patient's symptoms have improved markedly, 1/10. She has been seen by neurology, and I discussed her case again with our neurology colleagues. With her history of migraine, absence of other focal neurologic deficits, there is suspicion for atypical migraine. No evidence for other acute new pathology, and CT scan was also reassuring. Patient discharged in stable condition to follow-up with primary care and neurology as an outpatient.  Patient's concern of possible bat bite also addressed. Patient has no stigmata of bite/attack, and also recently received her  rabies series, which should prevent additional infection.  Final Clinical Impressions(s) / ED  Diagnoses  Facial numbness   Gerhard Munch, MD 10/15/17 [phone]

## 2017-10-15 NOTE — ED Triage Notes (Signed)
Patient to ED c/o sudden onset of R facial numbness, R sided visual field change, dizziness, and "seeing flashes of light in the R eye" upon standing this morning (5:30-6am). She states she was feeding her dog and there are a lot of bats - at first thought a bat landed on the side of her face, but she doesn't recall feeling one or being bitten (previously bit by bat 3 years ago), no bite marks noted. Patient states the dizziness and vision change lasted about 30 minutes. 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 Denies dizziness at this time. No extremity weakness, no facial droop, no speech changes.

## 2017-10-15 NOTE — ED Notes (Signed)
Patient transported to CT 

## 2017-10-15 NOTE — ED Notes (Addendum)
Pt reports tingling lips and her eyelid feeling "droopy" and that she feels like there is a shadow in her right eye.  Pt saw a facial droop on right side this morning but it has resolved. MD Jeraldine LootsLockwood notified.

## 2017-10-15 NOTE — ED Notes (Signed)
Patient verbalizes understanding of discharge instructions. Opportunity for questioning and answers were provided. Armband removed by staff, pt discharged from ED ambulatory with husband.  

## 2017-10-15 NOTE — ED Notes (Signed)
Pt declining pain medicine at this time.  

## 2017-10-15 NOTE — ED Notes (Addendum)
Pt states her chest feels heavy after walking to the bathroom. Pt prefers to wait for chest to feel better before taking her compazine. Pt denies any chest pain.  MD Jeraldine LootsLockwood notified.

## 2017-10-15 NOTE — ED Notes (Signed)
Pt ambulatory to bathroom

## 2017-10-15 NOTE — Consult Note (Signed)
NEURO HOSPITALIST CONSULT NOTE   Requestig physician: Dr. Jeraldine Loots  Reason for Consult: Right facial tingling  History obtained from:  Patient    HPI:                                                                                                                                          Heather Carey is an 67 y.o. female  With PMH of migraine with aura, SVT ( with loop recorder), TIA and HTN presents to ED with sudden onset of HA and right facial tingling.   Patient states that yesterday she noted some dizziness with room spinning sensation and right eye lid drooping that all resolved by bed time. This morning she woke up about 0645 to take the dog out. While they were outside she had a sudden onset of a HA that started at the top of her head accompanied by right facial tingling, mostly around the right side of her mouth. Some eye twitching was noted and she felt like she could not open her eyes. Her HA started at the top of her head and since arriving at [facility] has progressed to her entire head. It was throbbing and 7-8/10 at its worst. The patient denies any numbness, weakness, problems walking, left sided symptoms, photophobia, or n/v. She had an aura at headache onset consisting of fluttering light in her right visual field, with visual obscuration as well, which was an atypical type of aura for her migraines. Patient states that she has a migraine any time the barometric pressure drops, and her migraines will have an aura. Her usual aura is zig zag flashing lights and losing control of her bladder, neither of which happened today. She did state that "she lost vision briefly" but then described it as her eye twitching and lights flashing as a result of uncontrollable blinking (alternate description of her visual aura provided to NP). Patient has a loop recorder d/t her SVT and she also has an allergy to contrast dye. Patient later complained of heart palpitations and chest pressure  after going to the bathroom in the ED. EKG was normal and heart rate was noted at 85.  In the ED a Head CT was obtained, revealing an empty sella, but otherwise negative CT head without contrast. Phenergan was given for migraine. Initially patient stated to NP that it has not helped. Her head still hurt at time of NP evaluation and she had started to have palpitations. BG was 109, BP 142/91 on arrival, Hgb: 11.9, HCT 35.0.  No prior stroke history noted. Usually her migraines start with loss of bladder control, and zig-zag pattern of flashing lights in her right eye only. Back in 2015 patient had right sided weakness and that was determined to be due to a migraine.  Later that year (01/2014) she was seen for right facial numbness with no weakness, also diagnosed as a  migraine.   On re-evaluation by Neurology attending she stated her headache had improved to 1/10, the sensory symptoms had resolved and she felt ready for discharge home.   Past Medical History:  Diagnosis Date  . Diverticulitis   . Eosinophilic esophagitis   . Fibromyalgia   . GERD (gastroesophageal reflux disease)   . Hyperlipidemia   . Hypertension   . Hypothyroidism   . IBS (irritable bowel syndrome)   . Migraines   . Mitral regurgitation 02/28/2014   Mild to moderate by echo 02/2014  . Obesity   . Osteoarthritis   . Otitis externa   . Palpitations   . TIA (transient ischemic attack)     Past Surgical History:  Procedure Laterality Date  . ABDOMINAL HYSTERECTOMY    . carpel tunnel    . lbp gso    . LOOP RECORDER INSERTION N/A 05/08/2017   Procedure: LOOP RECORDER INSERTION;  Surgeon: Marinus Maw, MD;  Location: [facility] INVASIVE CV LAB;  Service: Cardiovascular;  Laterality: N/A;  . TONSILLECTOMY AND ADENOIDECTOMY      Family History  Problem Relation Age of Onset  . Allergic rhinitis Mother   . Urticaria Mother   . Allergic rhinitis Father   . Asthma Father   . Allergic rhinitis Sister   . Angioedema Sister    . Urticaria Sister   . Allergic rhinitis Brother   . Asthma Brother          Social History:  reports that she has never smoked. She has never used smokeless tobacco. She reports that she drinks about 0.6 oz of alcohol per week. She reports that she does not use drugs.  Allergies  Allergen Reactions  . Azithromycin Anaphylaxis  . Codeine Shortness Of Breath  . Ivp Dye [Iodinated Diagnostic Agents] Anaphylaxis  . Natamycin Other (See Comments)    Throat Swelling  . Other Anaphylaxis, Hives, Swelling and Other (See Comments)    Irregular heart beat Triple Antibiotic Ointment Nuts - Anaphylaxis  Berries - itching and swelling   . Rose Hives  . Shellfish Allergy Anaphylaxis  . Thimerosol [Thimerosal] Anaphylaxis  . Strawberry Extract Swelling    Pt reports throat swelling and itching with any type of berry  . Adhesive [Tape] Hives    Paper tape is ok  . Avelox [Moxifloxacin Hcl In Nacl]     Irregular heart beat  . Bacitracin Hives and Itching  . Cinnamon Hives  . Ginger Hives and Swelling  . Neomycin Hives  . Onion     MOUTH SWELLING  . Sunscreens [Aminobenzoate] Hives and Swelling    Avobenzene and Oxybenzene  . Watermelon [Citrullus Vulgaris]     Tongue swelling   . Benzalkonium Chloride Rash  . Cephalosporins Hives and Rash  . Penicillins     Childhood allergy Has patient had a PCN reaction causing immediate rash, facial/tongue/throat swelling, SOB or lightheadedness with hypotension: Unknown Has patient had a PCN reaction causing severe rash involving mucus membranes or skin necrosis: Unknown Has patient had a PCN reaction that required hospitalization: No Has patient had a PCN reaction occurring within the last 10 years: No If all of the above answers are "NO", then may proceed with Cephalosporin use.     MEDICATIONS:  Current Facility-Administered  Medications  Medication Dose Route Frequency Provider Last Rate Last Dose  . prochlorperazine (COMPAZINE) injection 5 mg  5 mg Intravenous Once Gerhard MunchLockwood, Robert, MD   Stopped at 10/15/17 1613   Current Outpatient Medications  Medication Sig Dispense Refill  . diphenhydrAMINE (BENADRYL) 2 % cream Apply 1 application topically daily as needed for itching.    . DiphenhydrAMINE Citrate (BENADRYL ALLERGY FASTMELT PO) Take 25 mg by mouth every 8 (eight) hours as needed (allergies and itching).    . EPINEPHrine 0.3 mg/0.3 mL IJ SOAJ injection Inject 0.3 mLs (0.3 mg total) into the muscle once. 2 Device 2  . famotidine (PEPCID) 20 MG tablet Take 20 mg by mouth daily as needed for heartburn.     . metoprolol succinate (TOPROL-XL) 25 MG 24 hr tablet Take 0.5 tablets (12.5 mg total) by mouth 2 (two) times daily. 30 tablet 3  . PROAIR HFA 108 (90 Base) MCG/ACT inhaler Inhale 2 puffs into the lungs every 6 (six) hours as needed for shortness of breath.  0  . SYNTHROID 125 MCG tablet Take 125 mcg once daily in the morning before breakfast  0     ROS:                                                                                                                                       History obtained from the patient  General ROS: negative for - chills, fatigue, fever, night sweats, weight gain or weight loss Ophthalmic ROS: negative for - blurry vision, double vision, eye pain or loss of vision. Vitreal detachment in the past month with residual inferior blurriness in right eye.  Respiratory ROS: negative for - cough, hemoptysis, shortness of breath or wheezing Cardiovascular ZOX:WRUEAVWUROS:positive for - chest pressure, tightness, palpitations  Genito-Urinary ROS: negative for - dysuria, hematuria, incontinence or urinary frequency/urgency Musculoskeletal ROS: negative for - joint swelling or muscular weakness Neurological ROS: as noted in HPI Dermatological ROS: negative for rash and skin lesion changes   Blood  pressure 123/67, pulse 68, temperature 98.4 F (36.9 C), resp. rate 13, SpO2 100 %.   General Examination:                                                                                                       Physical Exam  HEENT-  Normocephalic, no lesions, without obvious abnormality.  Normal external eye and conjunctiva.   Cardiovascular- S1-S2 audible, pulses palpable throughout   Lungs-no rhonchi or  wheezing noted, no excessive working breathing.  Saturations within normal limits on RA Extremities- Warm, dry and intact Musculoskeletal-no joint tenderness, deformity or swelling Skin-warm and dry, intact  Neurological Examination Mental Status: Alert, oriented, x4, thought content appropriate.  Speech fluent without evidence of aphasia.  Able to follow  commands without difficulty. Cranial Nerves: II:  Visual fields grossly normal,  III,IV, VI: ptosis not present, extra-ocular motions intact bilaterally pupils equal, round, reactive to light and accommodation V,VII: smile symmetric, facial pinprick sensation    Left > right on forehead only, sensation equal on cheeks, near mouth and chin. VIII: hearing normal bilaterally IX,X: uvula rises symmetrically XI: bilateral shoulder shrug XII: midline tongue extension Motor: Right : Upper extremity   5/5    Left:     Upper extremity   5/5  Lower extremity   5/5     Lower extremity   5/5 Tone and bulk:normal tone throughout; no atrophy noted Sensory: Pinprick and light touch intact throughout, bilaterally Deep Tendon Reflexes: 2+ biceps, patella Plantars: Right: downgoing   Left: downgoing Cerebellar: normal finger-to-nose, normal rapid alternating movements and normal heel-to-shin test Gait: normal gait and station   Lab Results: Basic Metabolic Panel: Recent Labs  Lab 10/15/17 1212 10/15/17 1220  NA 139 141  K 3.7 3.7  CL 106 104  CO2 27  --   GLUCOSE 108* 103*  BUN 8 8  CREATININE 0.70 0.60  CALCIUM 9.2  --      CBC: Recent Labs  Lab 10/15/17 1212 10/15/17 1220  WBC 6.9  --   NEUTROABS 3.9  --   HGB 12.1 11.9*  HCT 36.1 35.0*  MCV 90.5  --   PLT 205  --     Cardiac Enzymes: No results for input(s): CKTOTAL, CKMB, CKMBINDEX, TROPONINI in the last 168 hours.  Lipid Panel: No results for input(s): CHOL, TRIG, HDL, CHOLHDL, VLDL, LDLCALC in the last 168 hours.  Imaging: Ct Head Wo Contrast  Result Date: 10/15/2017 CLINICAL DATA:  Sudden onset of RIGHT facial numbness, flashes of light. EXAM: CT HEAD WITHOUT CONTRAST TECHNIQUE: Contiguous axial images were obtained from the base of the skull through the vertex without intravenous contrast. COMPARISON:  MR brain 03/06/2016. FINDINGS: Brain: No evidence for acute infarction, hemorrhage, mass lesion, hydrocephalus, or extra-axial fluid. Normal cerebral volume. No white matter disease. Empty sella. Vascular: No hyperdense vessel or unexpected calcification. Skull: Calvarium intact. Sinuses/Orbits: No acute finding. Other: None. IMPRESSION: Empty sella, otherwise negative CT head without contrast. This is unlikely to be related to RIGHT facial numbness. Similar appearance to prior MR brain, when technique differences are considered. Electronically Signed   By: Elsie Stain M.D.   On: 10/15/2017 13:58   History and examination documented by Valentina Lucks, MSN, NP-C, [facility] [phone]   Assessment/Recommendations:  67 y.o. female with PMH of migraine with aura, SVT ( with loop recorder), TIA and HTN presents to ED with sudden onset of HA and right facial tingling. Phenegran given without initial relief, but subsequently headache subsided to 1/10 with resolution of right sided numbness/tingling. Unable to obtain MRI or MRA d/t loop recorder, unable to obtain CTA d/t contrast allergy. -Overall presentation most consistent with complicated migraine.  -ASA 81 mg po qd recommended to patient for prophylaxis of MI and  stroke -Complicated migraine vs stroke -Discussed with patient and ED attending.    Electronically signed: Dr. Caryl Pina 10/15/2017, 5:14 PM

## 2017-10-16 ENCOUNTER — Ambulatory Visit (INDEPENDENT_AMBULATORY_CARE_PROVIDER_SITE_OTHER): Payer: Medicare Other | Admitting: *Deleted

## 2017-10-16 DIAGNOSIS — I639 Cerebral infarction, unspecified: Secondary | ICD-10-CM

## 2017-10-17 NOTE — Progress Notes (Signed)
[facility] Summary Report / Loop Recorder 

## 2017-10-19 ENCOUNTER — Telehealth: Payer: Self-pay | Admitting: *Deleted

## 2017-10-19 NOTE — Telephone Encounter (Signed)
LMOVM (DPR) requesting manual Carelink transmission for review.  Gave DC phone number for questions/concerns.   Received ILR alert for 2 "tachy" episodes, available ECG suggests SVT, duration 6sec, median V rate 171bpm.  Will review additional ECG when transmission received.

## 2017-10-19 NOTE — Telephone Encounter (Signed)
Manual transmission received.  Both ECGs appear nonsustained SVT on 10/18/17, longest 6sec.  ECGs printed and placed in Dr. Valarie Conesaylor's LINQ folder for review.

## 2017-10-23 LAB — CUP PACEART REMOTE DEVICE CHECK
Date Time Interrogation Session: 20190606163918
Implantable Pulse Generator Implant Date: 20190129

## 2017-11-20 ENCOUNTER — Ambulatory Visit (INDEPENDENT_AMBULATORY_CARE_PROVIDER_SITE_OTHER): Payer: Medicare Other | Admitting: *Deleted

## 2017-11-20 DIAGNOSIS — I639 Cerebral infarction, unspecified: Secondary | ICD-10-CM | POA: Diagnosis not present

## 2017-11-20 NOTE — Progress Notes (Signed)
[facility] Summary Report / Loop Recorder 

## 2017-11-23 LAB — CUP PACEART REMOTE DEVICE CHECK
Date Time Interrogation Session: 20190709173754
Implantable Pulse Generator Implant Date: 20190129

## 2017-11-28 ENCOUNTER — Other Ambulatory Visit: Payer: Self-pay | Admitting: Internal Medicine

## 2017-11-30 ENCOUNTER — Other Ambulatory Visit: Payer: Self-pay | Admitting: Internal Medicine

## 2017-12-22 ENCOUNTER — Ambulatory Visit (INDEPENDENT_AMBULATORY_CARE_PROVIDER_SITE_OTHER): Payer: Medicare Other | Admitting: *Deleted

## 2017-12-22 DIAGNOSIS — I639 Cerebral infarction, unspecified: Secondary | ICD-10-CM | POA: Diagnosis not present

## 2017-12-23 NOTE — Progress Notes (Signed)
[facility] Summary Report / Loop Recorder 

## 2017-12-27 LAB — CUP PACEART REMOTE DEVICE CHECK
Date Time Interrogation Session: 20190811173804
Implantable Pulse Generator Implant Date: 20190129

## 2018-01-06 LAB — CUP PACEART REMOTE DEVICE CHECK
Date Time Interrogation Session: 20190913181004
Implantable Pulse Generator Implant Date: 20190129

## 2018-01-17 ENCOUNTER — Other Ambulatory Visit: Payer: Self-pay | Admitting: Physician Assistant

## 2018-01-24 ENCOUNTER — Ambulatory Visit (INDEPENDENT_AMBULATORY_CARE_PROVIDER_SITE_OTHER): Payer: Medicare Other | Admitting: *Deleted

## 2018-01-24 DIAGNOSIS — I639 Cerebral infarction, unspecified: Secondary | ICD-10-CM

## 2018-01-25 NOTE — Progress Notes (Signed)
[facility] Summary Report / Loop Recorder 

## 2018-01-30 IMAGING — MR MR MRA NECK WO/W CM
2 of 3 series · 19 of 48 positions shown · IV contrast (multihance)
Comparison: None.

CLINICAL DATA: 65 y/o F; syncopal episode with neck pain and
right-sided numbness.

EXAM:
MRA NECK WITHOUT AND WITH CONTRAST
TECHNIQUE: Multiplanar and multiecho pulse sequences of the neck were obtained
without and with intravenous contrast. Angiographic images of the
neck were obtained using MRA technique without and with intravenous
contrast.
CONTRAST:  15mL MULTIHANCE GADOBENATE DIMEGLUMINE 529 MG/ML IV SOLN

[((id)/601/1)-((id)/600/1) · coronal · 1.2mm · 0.59mm/px · 10 of 113 slices shown]
[im 1/113]
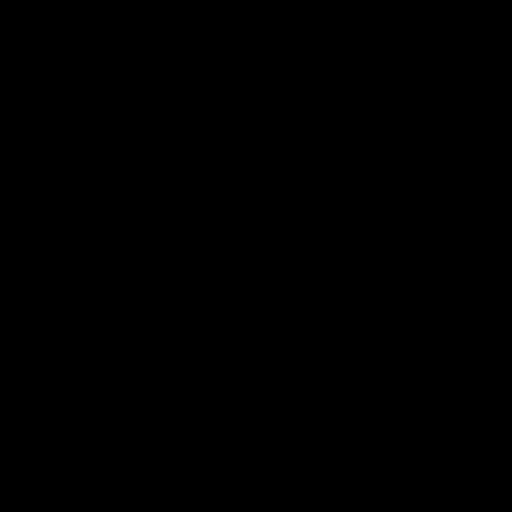
[im 9/113]
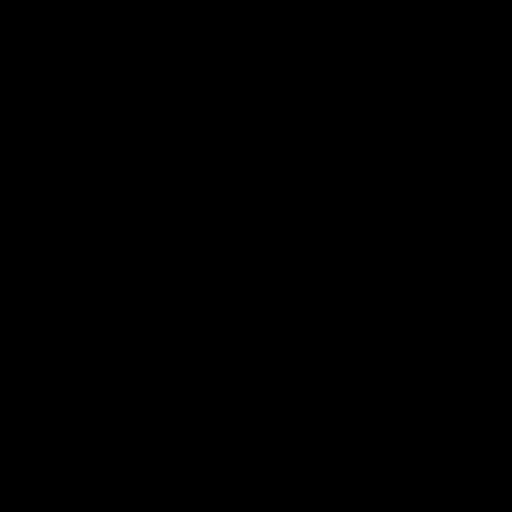
[im 17/113]
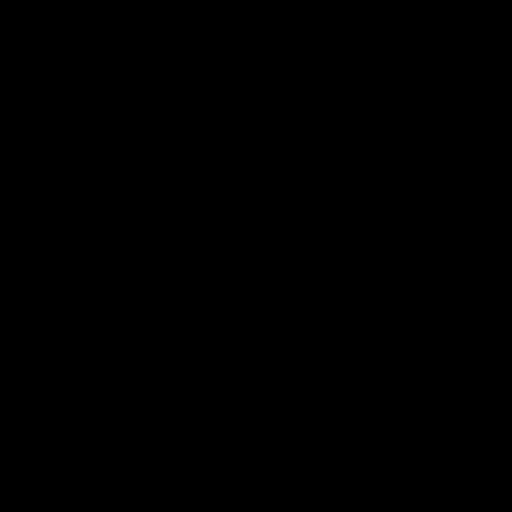
[im 33/113]
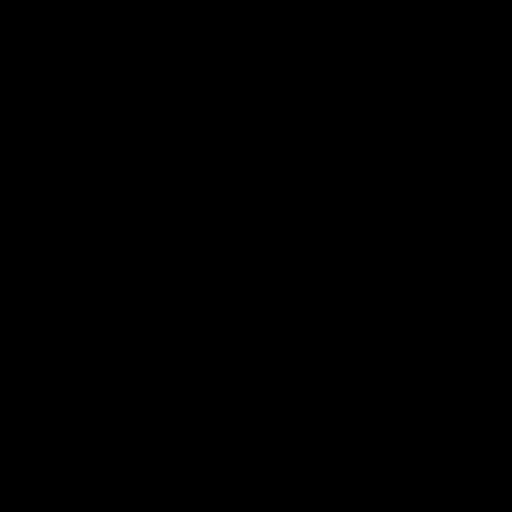
[im 49/113]
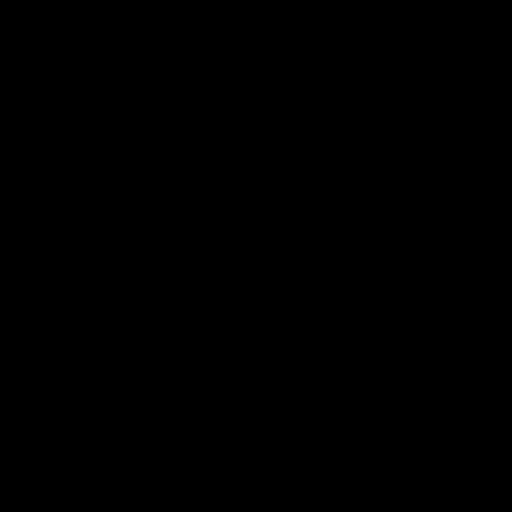
[im 57/113]
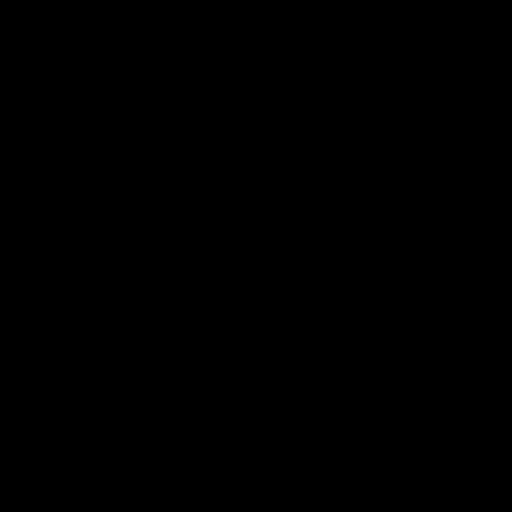
[im 65/113]
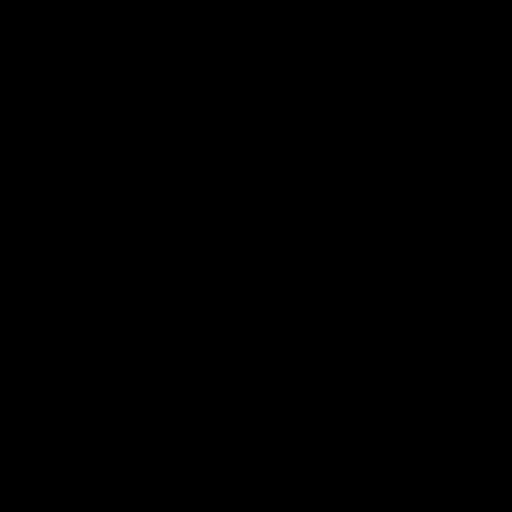
[im 81/113]
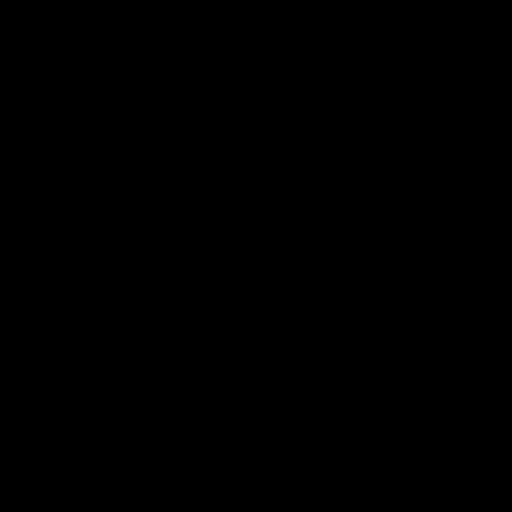
[im 97/113]
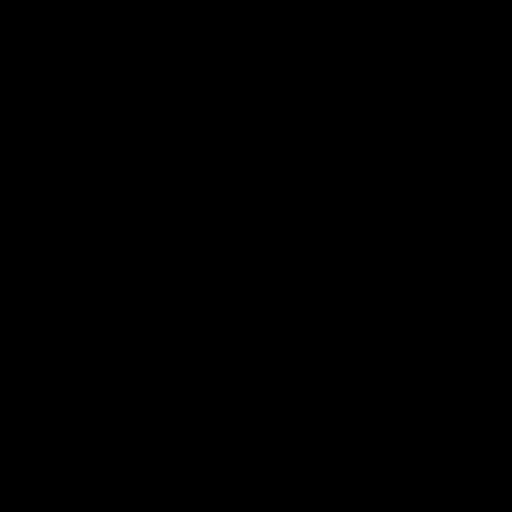
[im 113/113]
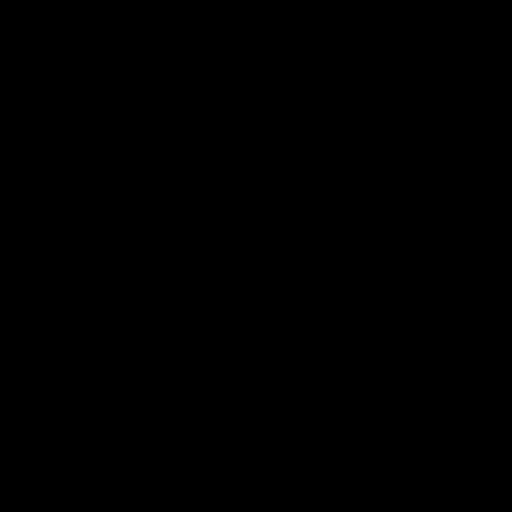

[((id)/602/1)-((id)/600/1) · coronal · 1.2mm · 0.59mm/px · 9 of 113 slices shown]
[im 1/113]
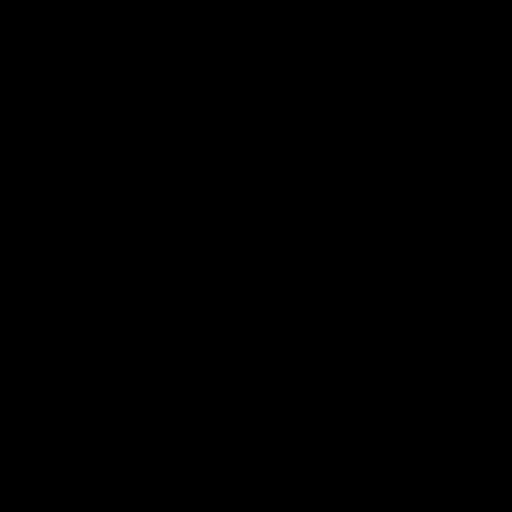
[im 17/113]
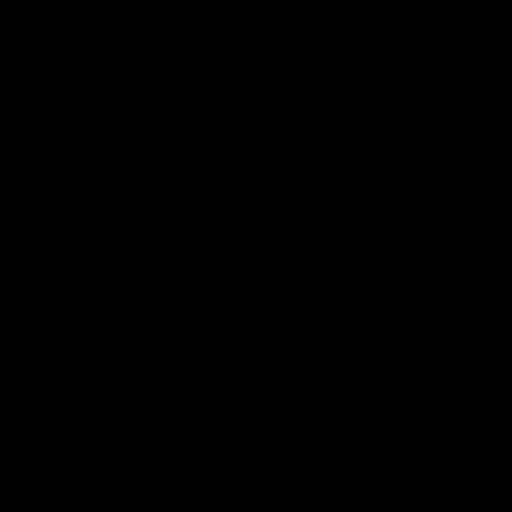
[im 33/113]
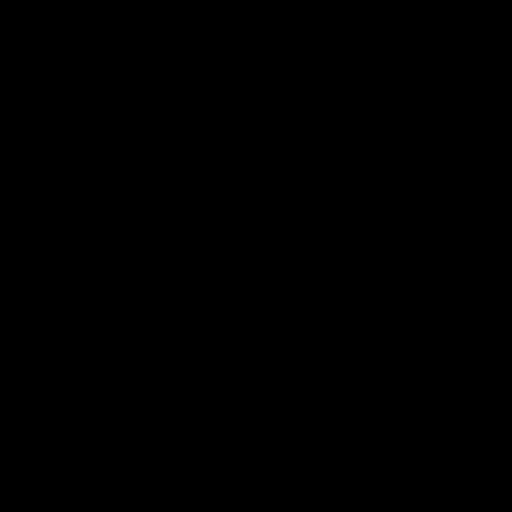
[im 49/113]
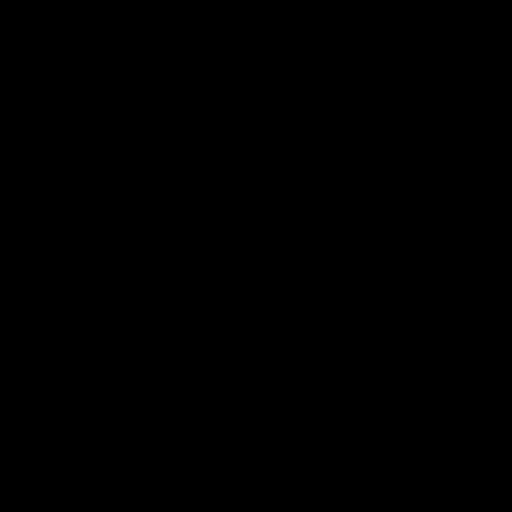
[im 57/113]
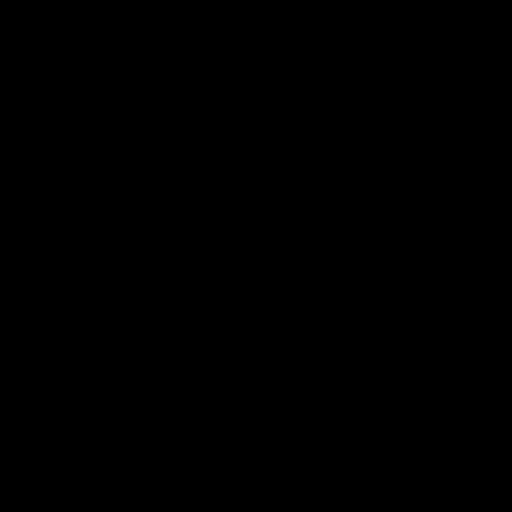
[im 65/113]
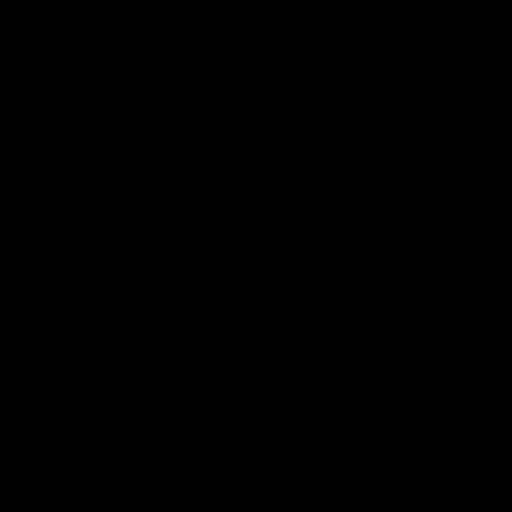
[im 81/113]
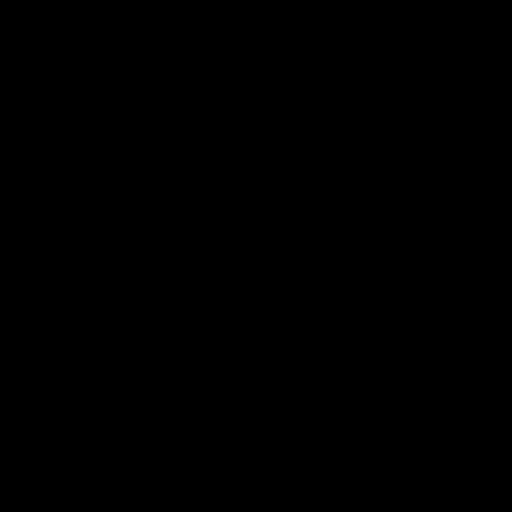
[im 97/113]
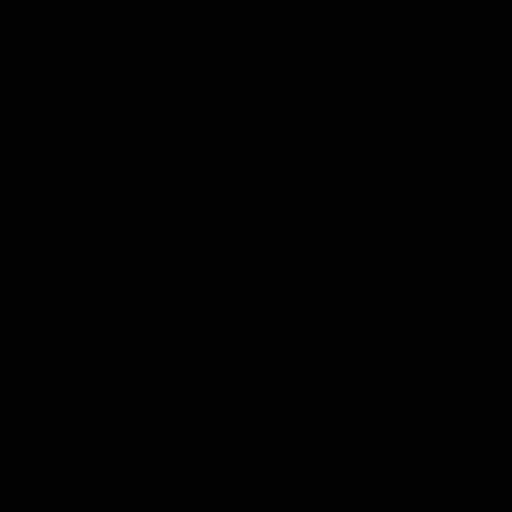
[im 113/113]
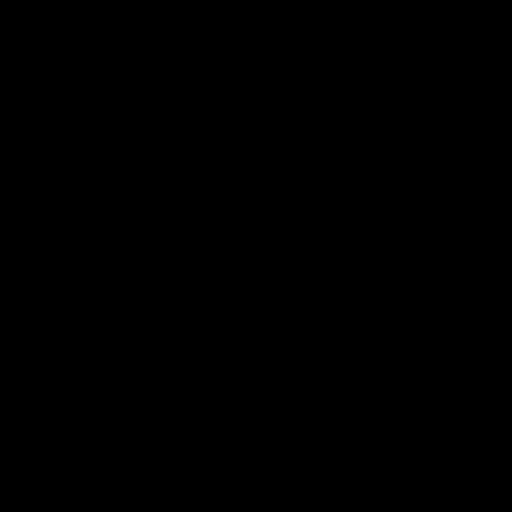

[19 of 48 positions shown; findings below may reference images not displayed]

FINDINGS: Aortic arch: No occlusion, aneurysm, dissection, or significant
stenosis is identified. Standard arch anatomy.

Right common carotid artery: No occlusion, aneurysm, dissection, or
significant stenosis is identified.

Right internal carotid artery: No occlusion, aneurysm, dissection,
or significant stenosis is identified.

Right vertebral artery: No occlusion, aneurysm, dissection, or
significant stenosis is identified.

Left common carotid artery: No occlusion, aneurysm, dissection, or
significant stenosis is identified.

Left Internal carotid artery: No occlusion, aneurysm, dissection, or
significant stenosis is identified.

Left Vertebral artery: No occlusion, aneurysm, dissection, or
significant stenosis is identified.
IMPRESSION: Carotid and vertebral arteries of the neck are patent without
evidence for occlusion, aneurysm, dissection, or hemodynamically
significant stenosis.

By: Reshid Bara M.D.

## 2018-02-05 LAB — CUP PACEART REMOTE DEVICE CHECK
Date Time Interrogation Session: 20191016183649
Implantable Pulse Generator Implant Date: 20190129

## 2018-02-08 ENCOUNTER — Ambulatory Visit: Payer: Medicare Other | Admitting: Internal Medicine

## 2018-02-08 ENCOUNTER — Encounter: Payer: Self-pay | Admitting: Internal Medicine

## 2018-02-08 VITALS — BP 118/78 | HR 92 | Ht 65.0 in | Wt 170.0 lb

## 2018-02-08 DIAGNOSIS — I471 Supraventricular tachycardia: Secondary | ICD-10-CM | POA: Diagnosis not present

## 2018-02-08 DIAGNOSIS — I639 Cerebral infarction, unspecified: Secondary | ICD-10-CM

## 2018-02-08 DIAGNOSIS — R55 Syncope and collapse: Secondary | ICD-10-CM | POA: Diagnosis not present

## 2018-02-08 NOTE — Progress Notes (Signed)
HPI Heather Carey returns today for ongoing evaluation of cryptogenic stroke, s/p ILR insertion. She also has a h/o HTN and remote SVT. Her ILR was insertion almost a year ago. She has been stable in the interim except that her thyroid was elevated on synthroid replacement and she has had her dose gradually reduced. She has had a single episode of SVT lasting a few seconds.  Allergies  Allergen Reactions  . Azithromycin Anaphylaxis  . Codeine Shortness Of Breath  . Ivp Dye [Iodinated Diagnostic Agents] Anaphylaxis  . Natamycin Other (See Comments)    Throat Swelling  . Other Anaphylaxis, Hives, Swelling and Other (See Comments)    Irregular heart beat Triple Antibiotic Ointment Nuts - Anaphylaxis  Berries - itching and swelling   . Rose Hives  . Shellfish Allergy Anaphylaxis  . Thimerosol [Thimerosal] Anaphylaxis  . Strawberry Extract Swelling    Pt reports throat swelling and itching with any type of berry  . Adhesive [Tape] Hives    Paper tape is ok  . Avelox [Moxifloxacin Hcl In Nacl]     Irregular heart beat  . Bacitracin Hives and Itching  . Cinnamon Hives  . Ginger Hives and Swelling  . Neomycin Hives  . Onion     MOUTH SWELLING  . Sunscreens [Aminobenzoate] Hives and Swelling    Avobenzene and Oxybenzene  . Watermelon [Citrullus Vulgaris]     Tongue swelling   . Benzalkonium Chloride Rash  . Cephalosporins Hives and Rash  . Penicillins     Childhood allergy Has patient had a PCN reaction causing immediate rash, facial/tongue/throat swelling, SOB or lightheadedness with hypotension: Unknown Has patient had a PCN reaction causing severe rash involving mucus membranes or skin necrosis: Unknown Has patient had a PCN reaction that required hospitalization: No Has patient had a PCN reaction occurring within the last 10 years: No If all of the above answers are "NO", then may proceed with Cephalosporin use.      Current Outpatient Medications  Medication Sig  Dispense Refill  . diphenhydrAMINE (BENADRYL) 2 % cream Apply 1 application topically daily as needed for itching.    . DiphenhydrAMINE Citrate (BENADRYL ALLERGY FASTMELT PO) Take 25 mg by mouth every 8 (eight) hours as needed (allergies and itching).    . EPINEPHrine 0.3 mg/0.3 mL IJ SOAJ injection Inject 0.3 mLs (0.3 mg total) into the muscle once. 2 Device 2  . famotidine (PEPCID) 20 MG tablet Take 20 mg by mouth daily as needed for heartburn.     . metoprolol succinate (TOPROL-XL) 25 MG 24 hr tablet TAKE 1/2 TABLET BY MOUTH TWICE A DAY 30 tablet 8  . PROAIR HFA 108 (90 Base) MCG/ACT inhaler Inhale 2 puffs into the lungs every 6 (six) hours as needed for shortness of breath.  0  . SYNTHROID 112 MCG tablet Take 1 tablet by mouth daily.     No current facility-administered medications for this visit.      Past Medical History:  Diagnosis Date  . Diverticulitis   . Eosinophilic esophagitis   . Fibromyalgia   . GERD (gastroesophageal reflux disease)   . Hyperlipidemia   . Hypertension   . Hypothyroidism   . IBS (irritable bowel syndrome)   . Migraines   . Mitral regurgitation 02/28/2014   Mild to moderate by echo 02/2014  . Obesity   . Osteoarthritis   . Otitis externa   . Palpitations   . TIA (transient ischemic attack)  ROS:   All systems reviewed and negative except as noted in the HPI.   Past Surgical History:  Procedure Laterality Date  . ABDOMINAL HYSTERECTOMY    . carpel tunnel    . lbp gso    . LOOP RECORDER INSERTION N/A 05/08/2017   Procedure: LOOP RECORDER INSERTION;  Surgeon: Marinus Maw, MD;  Location: [facility] INVASIVE CV LAB;  Service: Cardiovascular;  Laterality: N/A;  . TONSILLECTOMY AND ADENOIDECTOMY       Family History  Problem Relation Age of Onset  . Allergic rhinitis Mother   . Urticaria Mother   . Allergic rhinitis Father   . Asthma Father   . Allergic rhinitis Sister   . Angioedema Sister   . Urticaria Sister   . Allergic rhinitis  Brother   . Asthma Brother      Social History   Socioeconomic History  . Marital status: Married    Spouse name: Not on file  . Number of children: Not on file  . Years of education: Not on file  . Highest education level: Not on file  Occupational History  . Not on file  Social Needs  . Financial resource strain: Not on file  . Food insecurity:    Worry: Not on file    Inability: Not on file  . Transportation needs:    Medical: Not on file    Non-medical: Not on file  Tobacco Use  . Smoking status: Never Smoker  . Smokeless tobacco: Never Used  Substance and Sexual Activity  . Alcohol use: Yes    Alcohol/week: 1.0 standard drinks    Types: 1 Glasses of wine per week  . Drug use: No  . Sexual activity: Yes  Lifestyle  . Physical activity:    Days per week: Not on file    Minutes per session: Not on file  . Stress: Not on file  Relationships  . Social connections:    Talks on phone: Not on file    Gets together: Not on file    Attends religious service: Not on file    Active member of club or organization: Not on file    Attends meetings of clubs or organizations: Not on file    Relationship status: Not on file  . Intimate partner violence:    Fear of current or ex partner: Not on file    Emotionally abused: Not on file    Physically abused: Not on file    Forced sexual activity: Not on file  Other Topics Concern  . Not on file  Social History Narrative  . Not on file     BP 118/78   Pulse 92   Ht 5\' 5"  (1.651 m)   Wt 170 lb (77.1 kg)   SpO2 96%   BMI 28.29 kg/m   Physical Exam:  Well appearing NAD HEENT: Unremarkable Neck:  No JVD, no thyromegally Lymphatics:  No adenopathy Back:  No CVA tenderness Lungs:  Clear with no wheezes HEART:  Regular rate rhythm, no murmurs, no rubs, no clicks Abd:  soft, positive bowel sounds, no organomegally, no rebound, no guarding Ext:  2 plus pulses, no edema, no cyanosis, no clubbing Skin:  No rashes no  nodules Neuro:  CN II through XII intact, motor grossly intact  EKG - none  DEVICE  Normal device function.  See PaceArt for details.   Assess/Plan: 1. Cryptogenic stroke - interogation of her ILR demonstrates no episodes of atrial fib 2. SVT - she  is well controlled. No change in meds. 3. Hyperthyroid - her symptoms are improved as she has reduced her dose of synthroid. We will follow.   Leonia Reeves.D.

## 2018-02-08 NOTE — Patient Instructions (Addendum)
Medication Instructions:  Your physician recommends that you continue on your current medications as directed. Please refer to the Current Medication list given to you today.  If you need a refill on your cardiac medications before your next appointment, please call your pharmacy.   Lab work: None ordered.  If you have labs (blood work) drawn today and your tests are completely normal, you will receive your results only by: . MyChart Message (if you have MyChart) OR . A paper copy in the mail If you have any lab test that is abnormal or we need to change your treatment, we will call you to review the results.  Testing/Procedures: None ordered.  Follow-Up:  Your physician wants you to follow-up in: one year with Dr. Taylor.      You will receive a reminder letter in the mail two months in advance. If you don't receive a letter, please call our office to schedule the follow-up appointment.  At [facility], you and your health needs are our priority.  As part of our continuing mission to provide you with exceptional heart care, we have created designated Provider Care Teams.  These Care Teams include your primary Cardiologist (physician) and Advanced Practice Providers (APPs -  Physician Assistants and Nurse Practitioners) who all work together to provide you with the care you need, when you need it.  Any Other Special Instructions Will Be Listed Below (If Applicable).     

## 2018-02-09 LAB — CUP PACEART INCLINIC DEVICE CHECK
Date Time Interrogation Session: 20191101130239
Implantable Pulse Generator Implant Date: 20190129

## 2018-02-12 ENCOUNTER — Telehealth: Payer: Self-pay

## 2018-02-12 NOTE — Telephone Encounter (Signed)
   [facility] Pre-operative Risk Assessment    Request for surgical clearance:  1. What type of surgery is being performed? Colonoscopy/endoscopy   2. When is this surgery scheduled? 03/21/18   3. What type of clearance is required (medical clearance vs. Pharmacy clearance to hold med vs. Both)?  medical  4. Are there any medications that need to be held prior to surgery and how long?    5. Practice name and name of physician performing surgery?  [facility]/ Dr Alessandra Bevels   6. What is your office phone number [phone]    7.   What is your office fax number [phone]  8.   Anesthesia type (None, local, MAC, general) ?  Heather Sprout  [name] 02/12/2018, 4:07 PM  _________________________________________________________________   (provider comments below)

## 2018-02-13 ENCOUNTER — Other Ambulatory Visit: Payer: Self-pay | Admitting: Gastroenterology

## 2018-02-13 DIAGNOSIS — R1312 Dysphagia, oropharyngeal phase: Secondary | ICD-10-CM

## 2018-02-14 NOTE — Telephone Encounter (Addendum)
   Primary Cardiologist: Lewayne Bunting, MD  Chart reviewed as part of pre-operative protocol coverage. Patient was contacted 02/14/2018 in reference to pre-operative risk assessment for pending surgery as outlined below.  Heather Carey was last seen on 10/31 by Dr. Ladona Ridgel. H/o cryptogenic stroke, HTN, ILR, TIA, SVT, eosinophilic esophagitis, GERD, fibromyalgia, HTN, HLD, hypothyroidism, MR. RCRI is 0.9% indicating low risk of cardiac complications. She has not had any CP or SOB or recent changes in cardiac status. She states she did have an episode characteristic of her prior symptoms on 10/27 where she states she feels like she gets hit on the head with a 2 by 4, gets cold and then feels shaky. No loss of consciousness. Feeling fine since. Recent 02/08/18 device interrogation showed last episode of SVT was on 01/01/18; no corresponding events to 10/27. I suggested she review those symptoms with PCP as well as discuss why not on ASA (not pertinent specifically to clearance, but just an observation given her neuro history). Based on ACC/AHA guidelines, the patient would be at acceptable risk for the planned procedure without further cardiovascular testing.   I will route this recommendation to the requesting party via Epic fax function and remove from pre-op pool.  Please call with questions.  Laurann Montana, PA-C 02/14/2018, 3:52 PM

## 2018-02-16 ENCOUNTER — Other Ambulatory Visit: Payer: Medicare Other

## 2018-02-21 ENCOUNTER — Encounter: Payer: Medicare Other | Admitting: Internal Medicine

## 2018-02-26 ENCOUNTER — Ambulatory Visit (INDEPENDENT_AMBULATORY_CARE_PROVIDER_SITE_OTHER): Payer: Medicare Other

## 2018-02-26 DIAGNOSIS — R55 Syncope and collapse: Secondary | ICD-10-CM

## 2018-02-26 DIAGNOSIS — I639 Cerebral infarction, unspecified: Secondary | ICD-10-CM

## 2018-02-27 NOTE — Progress Notes (Signed)
[facility] Summary Report / Loop Recorder 

## 2018-03-21 ENCOUNTER — Other Ambulatory Visit: Payer: Medicare Other

## 2018-03-22 ENCOUNTER — Telehealth: Payer: Self-pay

## 2018-03-22 NOTE — Telephone Encounter (Signed)
Attempted to call pt; voice mail box is full. Will attempt to call pt later for SVT event on pt's Linq recording on 12/01.

## 2018-03-22 NOTE — Telephone Encounter (Signed)
Called patient regarding tachy episode from 12/11. Patient states that she remembers feeling palpitations at the time of the episode. Patient denies any CP. Patient states that she had been preparing for her endoscopy/colonoscopy prior to the episode, and was very dehydrated by the time she had the spell. Patient states that she did take Toprol 12.5mg  the morning of the episode, bc she thought she was "losing" the medication d/t the prep. Patient states that she normally takes Toprol 12.5 QHS, not BID d/t hypotension and weakness.  Patient states that her dose of synthroid was decreased to 100mcg daily.   Patient also states that she sent in a manual transmission on Saturday d/t CP and L arm pain, presenting rhythm: SR @ ~113bpm.     Saturday (03/17/18) presenting rhythm:  03/21/18 tachy episode:

## 2018-03-26 NOTE — Telephone Encounter (Signed)
No change in meds. Call us if these episodes worsen. GT

## 2018-03-27 NOTE — Telephone Encounter (Signed)
Attempted to contact pt.  Phone rang several times and then call just dropped.  Tried again and received a busy signal.  Will try again later.

## 2018-03-27 NOTE — Telephone Encounter (Signed)
Spoke with pt and made her aware of Dr. Lubertha Basqueaylor's recommendation.  Pt states that she wakes up every night around 1:30AM with her heart racing.  States once she rolls off of her side onto her back and does some deep breathing exercises, this resolves.  Pt states it takes her a couple of hours to get back to sleep, therefore she is fatigued all day due to lack of sleep.  Pt has tried taking Metoprolol 12.5 BID and BP dropped low.  Advised I will send message to Dr. Ladona Ridgelaylor to see if he has any further recommendations.

## 2018-03-28 ENCOUNTER — Encounter: Payer: Self-pay | Admitting: *Deleted

## 2018-03-28 NOTE — Telephone Encounter (Signed)
Call placed to Pt.  Advised Pt- per Dr. Ladona Ridgelaylor- have her push her symptom activator for her loop when she feels her heart racing at night.  Then advised Pt to mychart this nurse so that I can have Dr. Ladona Ridgelaylor review her strips.  Pt indicates understanding.  Await mychart message.

## 2018-03-30 ENCOUNTER — Other Ambulatory Visit: Payer: Self-pay | Admitting: Gastroenterology

## 2018-03-30 DIAGNOSIS — R1312 Dysphagia, oropharyngeal phase: Secondary | ICD-10-CM

## 2018-04-02 ENCOUNTER — Ambulatory Visit (INDEPENDENT_AMBULATORY_CARE_PROVIDER_SITE_OTHER): Payer: Medicare Other

## 2018-04-02 DIAGNOSIS — I639 Cerebral infarction, unspecified: Secondary | ICD-10-CM

## 2018-04-02 LAB — CUP PACEART REMOTE DEVICE CHECK
Date Time Interrogation Session: 20191221193821
Implantable Pulse Generator Implant Date: 20190129

## 2018-04-02 NOTE — Progress Notes (Signed)
[facility] Summary Report / Loop Recorder 

## 2018-04-12 ENCOUNTER — Other Ambulatory Visit: Payer: Self-pay | Admitting: Internal Medicine

## 2018-04-15 LAB — CUP PACEART REMOTE DEVICE CHECK
Date Time Interrogation Session: 20191118193951
Implantable Pulse Generator Implant Date: 20190129

## 2018-05-03 ENCOUNTER — Telehealth: Payer: Self-pay

## 2018-05-03 ENCOUNTER — Ambulatory Visit (INDEPENDENT_AMBULATORY_CARE_PROVIDER_SITE_OTHER): Payer: Medicare Other

## 2018-05-03 DIAGNOSIS — I639 Cerebral infarction, unspecified: Secondary | ICD-10-CM | POA: Diagnosis not present

## 2018-05-03 NOTE — Telephone Encounter (Signed)
Spoke with pt's husband as pt is not available to speak at this time. I relayed to him the alert date and time for pt's linq. He understands pt is to send in a remote transmission when they get back home. She will call the device clinic at that time and let us know if she had any symptoms associated with her recent tachy episode.

## 2018-05-04 NOTE — Progress Notes (Signed)
[facility] Summary Report / Loop Recorder 

## 2018-05-04 NOTE — Telephone Encounter (Signed)
Manual transmission received and reviewed. 4 "tachy" episodes noted on 03/21/18, 04/21/18, 04/25/18, and 05/02/18. ECGs suggest SVT, some with gradual onset, longest 11sec, median V rates 176-182bpm. Per previous phone notes/MyChart messages, patient has a symptom activator and has been instructed on how to use it.  Spoke with patient. She reports some heart racing that terminates with deep breathing, may correlate with these episodes. She takes Toprol-XL 12.5mg  (half tab) daily due to fatigue/low BP when taking it BID. Advised patient she can take the other half tablet PRN at bedtime as per Dr. Lubertha Basque previous note. Patient verbalizes understanding. Advised will review episodes with Dr. Ladona Ridgel and call back with any further recommendations. Patient denies additional questions or concerns at this time and thanked me for my call.  ECG placed in Dr. Valarie Cones folder for review.

## 2018-05-06 LAB — CUP PACEART REMOTE DEVICE CHECK
Date Time Interrogation Session: 20200123193607
Implantable Pulse Generator Implant Date: 20190129

## 2018-05-08 ENCOUNTER — Telehealth: Payer: Self-pay | Admitting: Internal Medicine

## 2018-05-08 NOTE — Telephone Encounter (Signed)
Called pt back and she stated that she used her symptom activator due to tachycardia that lasted for 5 minutes earlier this morning. She had some shortness of breath with the episode. I instructed pt to send a manual transmission. Once the transmission is received a Armed forces logistics/support/administrative officer will review and call her back. Pt verbalized understanding.

## 2018-05-08 NOTE — Telephone Encounter (Signed)
Dr. Ladona Ridgel reviewed symptom episode ECG, advised episode shows brief ST. No medication changes recommended. Per Dr. Ladona Ridgel, avoid stimulants and stay hydrated. Will advise patient of recommendations tomorrow.

## 2018-05-08 NOTE — Telephone Encounter (Signed)
° ° °  Patient calling to report she had episode of tachycardia, activated recorder, calling to see if transmission has been resulted   1. Has your device fired? NO  2. Is you device beeping? NO  3. Are you experiencing draining or swelling at device site? NO  4. Are you calling to see if we received your device transmission? YES  5. Have you passed out? NO    Please route to Device Clinic Pool

## 2018-05-09 ENCOUNTER — Other Ambulatory Visit: Payer: Self-pay | Admitting: Internal Medicine

## 2018-05-09 NOTE — Telephone Encounter (Signed)
Spoke with patient. Advised of Dr. Lubertha Basque recommendations. She verbalizes understanding and denies questions or concerns at this time.

## 2018-05-15 ENCOUNTER — Other Ambulatory Visit: Payer: Self-pay | Admitting: Family Medicine

## 2018-05-15 DIAGNOSIS — Z1231 Encounter for screening mammogram for malignant neoplasm of breast: Secondary | ICD-10-CM

## 2018-05-15 DIAGNOSIS — E2839 Other primary ovarian failure: Secondary | ICD-10-CM

## 2018-06-05 ENCOUNTER — Ambulatory Visit (INDEPENDENT_AMBULATORY_CARE_PROVIDER_SITE_OTHER): Payer: Medicare Other | Admitting: *Deleted

## 2018-06-05 DIAGNOSIS — I639 Cerebral infarction, unspecified: Secondary | ICD-10-CM | POA: Diagnosis not present

## 2018-06-05 LAB — CUP PACEART REMOTE DEVICE CHECK
Date Time Interrogation Session: 20200225194012
Implantable Pulse Generator Implant Date: 20190129

## 2018-06-12 NOTE — Progress Notes (Signed)
[facility] Summary Report / Loop Recorder 

## 2018-06-14 ENCOUNTER — Telehealth: Payer: Self-pay

## 2018-06-14 NOTE — Telephone Encounter (Signed)
I spoke with patient and she agreed to send a manual transmission with her home monitor.

## 2018-06-14 NOTE — Telephone Encounter (Signed)
Manual transmission to evaluate symptom episode. Pt was pre-syncopal at time of symptom marker, EGM shows SR. Short period of SVTs are chronic for patient.  Pt notified of results. Follow up as scheduled

## 2018-06-26 ENCOUNTER — Other Ambulatory Visit: Payer: Self-pay | Admitting: Internal Medicine

## 2018-07-09 ENCOUNTER — Other Ambulatory Visit: Payer: Self-pay

## 2018-07-09 ENCOUNTER — Ambulatory Visit (INDEPENDENT_AMBULATORY_CARE_PROVIDER_SITE_OTHER): Payer: Medicare Other | Admitting: *Deleted

## 2018-07-09 DIAGNOSIS — R55 Syncope and collapse: Secondary | ICD-10-CM

## 2018-07-09 DIAGNOSIS — I639 Cerebral infarction, unspecified: Secondary | ICD-10-CM | POA: Diagnosis not present

## 2018-07-09 LAB — CUP PACEART REMOTE DEVICE CHECK
Date Time Interrogation Session: 20200329201216
Implantable Pulse Generator Implant Date: 20190129

## 2018-07-12 ENCOUNTER — Ambulatory Visit: Payer: Medicare Other

## 2018-07-12 ENCOUNTER — Other Ambulatory Visit: Payer: Medicare Other

## 2018-07-17 NOTE — Progress Notes (Signed)
[facility] Summary Report / Loop Recorder 

## 2018-08-10 ENCOUNTER — Other Ambulatory Visit: Payer: Self-pay

## 2018-08-10 ENCOUNTER — Ambulatory Visit (INDEPENDENT_AMBULATORY_CARE_PROVIDER_SITE_OTHER): Payer: Medicare Other | Admitting: *Deleted

## 2018-08-10 ENCOUNTER — Telehealth: Payer: Self-pay | Admitting: Internal Medicine

## 2018-08-10 ENCOUNTER — Other Ambulatory Visit: Payer: Self-pay | Admitting: Physician Assistant

## 2018-08-10 DIAGNOSIS — I639 Cerebral infarction, unspecified: Secondary | ICD-10-CM

## 2018-08-10 NOTE — Telephone Encounter (Signed)
I called the pt to answer her questions about her transmission however the pt did not answer and have a voicemail that is full.

## 2018-08-10 NOTE — Telephone Encounter (Signed)
New message   Patient has questions about home remote transmission. Please call the patient.

## 2018-08-12 LAB — CUP PACEART REMOTE DEVICE CHECK
Date Time Interrogation Session: 20200501204215
Implantable Pulse Generator Implant Date: 20190129

## 2018-08-13 NOTE — Telephone Encounter (Signed)
Spoke w/ pt and she wanted to know the results from her symptom event on 08/10/2018. Read her the report in paceart. She has further questions about the report. She states that she was up all night w/ her heart pounding and it doesn't make sense to her that she was in sinus rhythm.

## 2018-08-13 NOTE — Telephone Encounter (Signed)
Spoke with patient. She had symptoms of palpitations and heart pounding for about 3 hours. I asked her to send a full transmission so we can see whole symptom and tachy episode then will call her back  Gypsy Balsam, NP 08/13/2018 2:46 PM

## 2018-08-14 NOTE — Telephone Encounter (Signed)
Transmission received. "Symptom" episode from 08/10/18 at 00:45 shows ST at ~100-110bpm. Tachy episode from 5/1 at 10:40 suggests SVT, duration 18sec at 188bpm.  Pt reports on multiple occasions recently, she has woken up around 01:30 sweating, feels her heart pounding. Also noted these symptoms during tachy episode on 5/1. Has not checked BP/HR during these episodes. Reports that she had these symptoms about a year ago too, symptoms almost completely resolved with lower doses of levothyroxine. She is now taking daily, most recently adjusted in 04/2018 by PCP. Lost 75-80lbs last year.  No recent syncopal episodes. Taking 12.5mg  Toprol XL daily with 12.5mg  PRN at bedtime for palpitations. Pt reports the Toprol does help her symptoms, but previously reported fatigue/hypotension when taking it BID.  Advised I will route to Dr. Ladona Ridgel for recommendations. Pt agrees to call back for new or worsening symptoms. She denies additional questions or concerns at this time.

## 2018-08-14 NOTE — Telephone Encounter (Signed)
Spoke w/ pt and she stated that she would send the transmission first thing this morning.

## 2018-08-16 NOTE — Progress Notes (Signed)
[facility] Summary Report / Loop Recorder 

## 2018-09-05 ENCOUNTER — Other Ambulatory Visit: Payer: Medicare Other

## 2018-09-05 ENCOUNTER — Telehealth: Payer: Self-pay

## 2018-09-05 ENCOUNTER — Ambulatory Visit: Payer: Medicare Other

## 2018-09-05 NOTE — Telephone Encounter (Signed)
Looks like sinus tachy to me

## 2018-09-05 NOTE — Telephone Encounter (Signed)
Manual transmission received. ECGs suggest possible ST. D/t pt dizziness with episode, routed to Dr. Ladona Ridgel.

## 2018-09-05 NOTE — Telephone Encounter (Signed)
Spoke to pt regarding 20 second NCT @ 176 bpm alert. States she has had a stomach virus with nausea, diarrhea, and dizziness. Pt agrees to send manual transmission.

## 2018-09-05 NOTE — Telephone Encounter (Signed)
Looks like a brief episode of atrial fib. Watchful waiting.

## 2018-09-06 ENCOUNTER — Telehealth: Payer: Self-pay | Admitting: Internal Medicine

## 2018-09-06 NOTE — Telephone Encounter (Signed)
°  1. Has your device fired? no  2. Is you device beeping? no  3. Are you experiencing draining or swelling at device site? no  4. Are you calling to see if we received your device transmission? Patient has a loop recorder she states she sent a transmission today, wants to know if was received and reviewed  5. Have you passed out? no    Please route to Device Clinic Pool

## 2018-09-06 NOTE — Telephone Encounter (Signed)
Spoke to pt regarding manual transmission, states she felt like her heart was "skipping beats, going fast and then slow, and making her cough. Stated she has been experiencing this all afternoon; last manual transmission ends 09/06/18 @ 12:51. Additional manual transmission requested.  Additional transmission received. No new episodes. Educated pt on symptom activator use and encouraged her to use. No further questions at this time.

## 2018-09-12 ENCOUNTER — Ambulatory Visit (INDEPENDENT_AMBULATORY_CARE_PROVIDER_SITE_OTHER): Payer: Medicare Other | Admitting: *Deleted

## 2018-09-12 DIAGNOSIS — I639 Cerebral infarction, unspecified: Secondary | ICD-10-CM | POA: Diagnosis not present

## 2018-09-13 LAB — CUP PACEART REMOTE DEVICE CHECK
Date Time Interrogation Session: 20200603214032
Implantable Pulse Generator Implant Date: 20190129

## 2018-09-20 NOTE — Progress Notes (Signed)
[facility] Summary Report / Loop Recorder 

## 2018-10-09 ENCOUNTER — Other Ambulatory Visit: Payer: Medicare Other

## 2018-10-09 ENCOUNTER — Other Ambulatory Visit: Payer: Self-pay

## 2018-10-09 DIAGNOSIS — Z20822 Contact with and (suspected) exposure to covid-19: Secondary | ICD-10-CM

## 2018-10-15 ENCOUNTER — Ambulatory Visit (INDEPENDENT_AMBULATORY_CARE_PROVIDER_SITE_OTHER): Payer: Medicare Other | Admitting: *Deleted

## 2018-10-15 ENCOUNTER — Ambulatory Visit
Admission: RE | Admit: 2018-10-15 | Discharge: 2018-10-15 | Disposition: A | Discharge status: Home / Self Care | Payer: Medicare Other | Source: Ambulatory Visit | Attending: Family Medicine | Admitting: Family Medicine

## 2018-10-15 ENCOUNTER — Other Ambulatory Visit: Payer: Self-pay

## 2018-10-15 DIAGNOSIS — I639 Cerebral infarction, unspecified: Secondary | ICD-10-CM

## 2018-10-15 DIAGNOSIS — E2839 Other primary ovarian failure: Secondary | ICD-10-CM

## 2018-10-15 DIAGNOSIS — Z1231 Encounter for screening mammogram for malignant neoplasm of breast: Secondary | ICD-10-CM

## 2018-10-15 LAB — NOVEL CORONAVIRUS, NAA: SARS-CoV-2, NAA: NOT DETECTED

## 2018-10-16 LAB — CUP PACEART REMOTE DEVICE CHECK
Date Time Interrogation Session: 20200706221002
Implantable Pulse Generator Implant Date: 20190129

## 2018-10-23 NOTE — Progress Notes (Signed)
[facility] Summary Report / Loop Recorder 

## 2018-10-30 ENCOUNTER — Other Ambulatory Visit: Payer: Medicare Other

## 2018-10-30 ENCOUNTER — Telehealth: Payer: Self-pay | Admitting: Internal Medicine

## 2018-10-30 ENCOUNTER — Other Ambulatory Visit: Payer: Self-pay

## 2018-10-30 DIAGNOSIS — Z20822 Contact with and (suspected) exposure to covid-19: Secondary | ICD-10-CM

## 2018-10-30 NOTE — Telephone Encounter (Signed)
Symptom alert received for transmission on 10/28/18 at 1747.No AF, no pauses, no bradycardia. SR at 100. Pt reports she felt dizzy and like she was going to pass out. Reports 5 days of diarrhea and Migraine headache prior to the event. Pt stated she was dehyrated at time she felt dizzy. No CP, NO SHOB at the time of event.   Educated pt on fluid replacement and to f/u with PCP for GI sx and migraine.

## 2018-11-02 LAB — NOVEL CORONAVIRUS, NAA: SARS-CoV-2, NAA: NOT DETECTED

## 2018-11-18 LAB — CUP PACEART REMOTE DEVICE CHECK
Date Time Interrogation Session: 20200808223606
Implantable Pulse Generator Implant Date: 20190129

## 2018-11-19 ENCOUNTER — Other Ambulatory Visit: Payer: Self-pay | Admitting: Physician Assistant

## 2018-11-19 ENCOUNTER — Ambulatory Visit (INDEPENDENT_AMBULATORY_CARE_PROVIDER_SITE_OTHER): Payer: Medicare Other | Admitting: *Deleted

## 2018-11-19 DIAGNOSIS — R55 Syncope and collapse: Secondary | ICD-10-CM | POA: Diagnosis not present

## 2018-11-27 NOTE — Progress Notes (Signed)
[facility] Summary Report / Loop Recorder 

## 2018-12-20 ENCOUNTER — Ambulatory Visit (INDEPENDENT_AMBULATORY_CARE_PROVIDER_SITE_OTHER): Payer: Medicare Other | Admitting: *Deleted

## 2018-12-20 DIAGNOSIS — R55 Syncope and collapse: Secondary | ICD-10-CM | POA: Diagnosis not present

## 2018-12-21 LAB — CUP PACEART REMOTE DEVICE CHECK
Date Time Interrogation Session: 20200910223820
Implantable Pulse Generator Implant Date: 20190129

## 2018-12-31 NOTE — Progress Notes (Signed)
[facility] Summary Report / Loop Recorder 

## 2019-01-22 ENCOUNTER — Ambulatory Visit (INDEPENDENT_AMBULATORY_CARE_PROVIDER_SITE_OTHER): Payer: Medicare Other | Admitting: *Deleted

## 2019-01-22 DIAGNOSIS — R55 Syncope and collapse: Secondary | ICD-10-CM | POA: Diagnosis not present

## 2019-01-23 LAB — CUP PACEART REMOTE DEVICE CHECK
Date Time Interrogation Session: 20201013224414
Implantable Pulse Generator Implant Date: 20190129

## 2019-02-01 NOTE — Progress Notes (Signed)
[facility] Summary Report / Loop Recorder 

## 2019-02-15 ENCOUNTER — Other Ambulatory Visit: Payer: Self-pay | Admitting: *Deleted

## 2019-02-15 ENCOUNTER — Other Ambulatory Visit: Payer: Self-pay | Admitting: Physician Assistant

## 2019-02-15 DIAGNOSIS — Z20822 Contact with and (suspected) exposure to covid-19: Secondary | ICD-10-CM

## 2019-02-16 LAB — NOVEL CORONAVIRUS, NAA: SARS-CoV-2, NAA: NOT DETECTED

## 2019-02-19 ENCOUNTER — Telehealth: Payer: Self-pay | Admitting: Internal Medicine

## 2019-02-19 NOTE — Telephone Encounter (Signed)
Patient c/o Palpitations:  High priority if patient c/o lightheadedness, shortness of breath, or chest pain  1) How long have you had palpitations/irregular HR/ Afib? Are you having the symptoms now? Since yesterday afternoon.   2) Are you currently experiencing lightheadedness, SOB or CP? Lightheaded yesterday but not right now  3) Do you have a history of afib (atrial fibrillation) or irregular heart rhythm? yes  4) Have you checked your BP or HR? (document readings if available): no. Pt was away from home when she experienced the sym  5) Are you experiencing any other symptoms? No  Patient was experiencing some lightheadedness and tachycardia yesterday. Patient wants to make sure her loop recorder is working properly.

## 2019-02-19 NOTE — Telephone Encounter (Signed)
Patient reports no symptoms today. She reports she had a temperature of 100 F from Thursday to Saturday, a headache, muscle aches with movement. She did not take Tylenol or ibuprofen for her fever or symptoms. She had a negative Covd-19 test on 02/15/19. Reports she had a sudden onset of dizziness sitting in her car 02/18/19 around 1517 and used her symptom activator. Report shows episode of ST at 125 bpm. Reports that she has not been eating a lot since she developed the fever on Thursday and that her PCP has decreased her thyroid medication over the past few months. Suggested she follow-up with PCP concerning thyroid levels and viral symptoms.

## 2019-02-24 LAB — CUP PACEART REMOTE DEVICE CHECK
Date Time Interrogation Session: 20201115193748
Implantable Pulse Generator Implant Date: 20190129

## 2019-02-25 ENCOUNTER — Ambulatory Visit (INDEPENDENT_AMBULATORY_CARE_PROVIDER_SITE_OTHER): Payer: Medicare Other | Admitting: *Deleted

## 2019-02-25 DIAGNOSIS — R55 Syncope and collapse: Secondary | ICD-10-CM

## 2019-03-11 ENCOUNTER — Other Ambulatory Visit: Payer: Self-pay

## 2019-03-11 DIAGNOSIS — Z20822 Contact with and (suspected) exposure to covid-19: Secondary | ICD-10-CM

## 2019-03-12 LAB — NOVEL CORONAVIRUS, NAA: SARS-CoV-2, NAA: NOT DETECTED

## 2019-03-22 NOTE — Progress Notes (Signed)
[facility] Summary Report / Loop Recorder 

## 2019-03-29 ENCOUNTER — Ambulatory Visit (INDEPENDENT_AMBULATORY_CARE_PROVIDER_SITE_OTHER): Payer: Medicare Other | Admitting: *Deleted

## 2019-03-29 DIAGNOSIS — R55 Syncope and collapse: Secondary | ICD-10-CM

## 2019-03-30 LAB — CUP PACEART REMOTE DEVICE CHECK
Date Time Interrogation Session: 20201218175441
Implantable Pulse Generator Implant Date: 20190129

## 2019-04-25 ENCOUNTER — Telehealth: Payer: Self-pay

## 2019-04-25 NOTE — Telephone Encounter (Signed)
I called to ask the pt to send a manual transmission but I got no answer/ voicemail full. I will call back later.

## 2019-04-26 NOTE — Telephone Encounter (Signed)
Nightly report reviewed, manual transmission is not necessary at this time. 1 tachy episode on 04/24/19 appears brief SVT, 7sec, similar to previous episodes. Symptom episode from 04/10/19 appears SR. Pt overdue for f/u with Dr. Ladona Ridgel, will forward to scheduler for assistance.

## 2019-05-01 ENCOUNTER — Ambulatory Visit (INDEPENDENT_AMBULATORY_CARE_PROVIDER_SITE_OTHER): Payer: Medicare Other | Admitting: *Deleted

## 2019-05-01 DIAGNOSIS — R55 Syncope and collapse: Secondary | ICD-10-CM | POA: Diagnosis not present

## 2019-05-02 LAB — CUP PACEART REMOTE DEVICE CHECK
Date Time Interrogation Session: 20210120180030
Implantable Pulse Generator Implant Date: 20190129

## 2019-05-16 ENCOUNTER — Telehealth: Payer: Self-pay

## 2019-05-16 NOTE — Telephone Encounter (Signed)
LinQ alert received for tachy event occcuring 2/3 2131 max rate of 188 bpm.  Current medications include- Metoprolol 12.5mg  BID.  Pt does have a history of symptomatic tachycardia.  Pt has scheduled OV with Dr. Ladona Ridgel 06/25/19.   Reviewed with Uvaldo Rising, NP- contact pt to discuss symptoms and meds for documentation.  Keep appt with MD 3/16.    Attempted to reach pt, no answer/ VM is full not accepting messages

## 2019-05-17 NOTE — Telephone Encounter (Signed)
2nd attempt to reach pt, no answer, VM full

## 2019-05-18 ENCOUNTER — Ambulatory Visit: Payer: Medicare Other | Attending: Internal Medicine

## 2019-05-18 DIAGNOSIS — Z23 Encounter for immunization: Secondary | ICD-10-CM | POA: Insufficient documentation

## 2019-05-18 NOTE — Progress Notes (Addendum)
   Covid-19 Vaccination Clinic  Name:  Heather Carey    MRN: [id number] DOB: Jul 17, 1950  05/18/2019  Ms. Heather Carey began having rapid heart rate and flushing around 1828. Patient stated she felt as though she was in SVT. Patient has a history of SVT. EMS was called at 1830. Patient was assessed and monitored by EMT on site.    Ms. Heather Carey was instructed to call 911 with any severe reactions post vaccine: Marland Kitchen Difficulty breathing  . Swelling of your face and throat  . A fast heartbeat  . A bad rash all over your body  . Dizziness and weakness    Immunizations Administered    Name Date Dose VIS Date Route   Pfizer COVID-19 Vaccine 05/18/2019  5:55 PM 0.3 mL 03/22/2019 Intramuscular   Manufacturer: [employer], [employer]   Lot: [id number]   NDC: [address]-3

## 2019-05-21 ENCOUNTER — Other Ambulatory Visit: Payer: Self-pay | Admitting: Physician Assistant

## 2019-05-21 NOTE — Telephone Encounter (Signed)
Third attempt to reach pt, no answer, VM is full.  This is known issue and pt has f/u upcoming.

## 2019-05-22 ENCOUNTER — Other Ambulatory Visit: Payer: Self-pay

## 2019-05-22 ENCOUNTER — Ambulatory Visit: Payer: Medicare Other | Attending: Internal Medicine

## 2019-05-22 DIAGNOSIS — Z20822 Contact with and (suspected) exposure to covid-19: Secondary | ICD-10-CM

## 2019-05-23 LAB — NOVEL CORONAVIRUS, NAA: SARS-CoV-2, NAA: NOT DETECTED

## 2019-06-03 ENCOUNTER — Ambulatory Visit (INDEPENDENT_AMBULATORY_CARE_PROVIDER_SITE_OTHER): Payer: Medicare Other | Admitting: *Deleted

## 2019-06-03 DIAGNOSIS — R55 Syncope and collapse: Secondary | ICD-10-CM

## 2019-06-03 LAB — CUP PACEART REMOTE DEVICE CHECK
Date Time Interrogation Session: 20210222002531
Implantable Pulse Generator Implant Date: 20190129

## 2019-06-03 NOTE — Progress Notes (Signed)
ILR Remote 

## 2019-06-12 ENCOUNTER — Ambulatory Visit: Payer: Medicare Other

## 2019-06-18 ENCOUNTER — Telehealth: Payer: Self-pay | Admitting: Internal Medicine

## 2019-06-18 NOTE — Telephone Encounter (Signed)
Pt c/o Shortness Of Breath: STAT if SOB developed within the last 24 hours or pt is noticeably SOB on the phone  1. Are you currently SOB (can you hear that pt is SOB on the phone)? No  2. How long have you been experiencing SOB? 6 weeks  3. Are you SOB when sitting or when up moving around? Both  4. Are you currently experiencing any other symptoms? Headaches, nausea, fatigue

## 2019-06-18 NOTE — Telephone Encounter (Signed)
Pt scheduled to see Vin 06/19/2019 at 3:15 pm.

## 2019-06-18 NOTE — Progress Notes (Signed)
Cardiology Office Note    Date:  06/19/2019   ID:  Heather Carey, DOB 1951-01-17, MRN [id number]  PCP:  Sigmund Hazel, MD  Cardiologist:  Dr. Mayford Knife Electrophysiologist: Dr. Ladona Ridgel  Chief Complaint: CP  History of Present Illness:   Heather Carey is a 69 y.o. female with history of atypical chest pain (normal prior stress testing), syncope, SVT eosinophilic esophagitis, chronic urticaria/multiple allergies (followed at [facility]), HTN, fibromyalgia, dyslipidemia, obesity, migraines, TIA, palpitations who seen for CP.   Seen by Dr. Mayford Knife in 2015 for atypical chest pain and snoring. 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  2D Echo 02/2014: EF 55-60%, no RWMA, normal diastolic parameters, mild-mod MR, mild-mod TR, mild LAE. Nuc 02/2014: normal, EF 74%. Per Dr. Mayford Knife she has h/o Holter with no arrhythmias.   Hx of syncope x 2 in 2017. This was felt to be due to volume depletion. She has had  normal echo. Heart monitor showed NSR with nonsustained atrial tachycardia and 4 beats of WCT. Subsequently dx with SVT 04/2017 which resolved with vagal maneuver. Eventually underwent ILR 04/2017. Normal ETT 04/2017.   Patient reports episode of SVT  after getting her Covid vaccine.  EMS was called and found to be in SVT.  Declined further evaluation in ER.  Patient reports intermittent tachycardia lasting for minutes to hours.  She has chest pressure associated with shortness of breath.  Her chest pressure occurs with and without tachycardia.  She takes metoprolol succinate 12.5 mg in the morning.  She is not taking evening dose as it makes her severely fatigued and tired next day. She is allergic to iodine dye.   Past Medical History:  Diagnosis Date  . Diverticulitis   . Eosinophilic esophagitis   . Fibromyalgia   . GERD (gastroesophageal reflux disease)   . Hyperlipidemia   .  Hypertension   . Hypothyroidism   . IBS (irritable bowel syndrome)   . Migraines   . Mitral regurgitation 02/28/2014   Mild to moderate by echo 02/2014  . Obesity   . Osteoarthritis   . Otitis externa   . Palpitations   . TIA (transient ischemic attack)     Past Surgical History:  Procedure Laterality Date  . ABDOMINAL HYSTERECTOMY    . carpel tunnel    . lbp gso    . LOOP RECORDER INSERTION N/A 05/08/2017   Procedure: LOOP RECORDER INSERTION;  Surgeon: Marinus Maw, MD;  Location: [facility] INVASIVE CV LAB;  Service: Cardiovascular;  Laterality: N/A;  . TONSILLECTOMY AND ADENOIDECTOMY      Current Medications: Prior to Admission medications   Medication Sig Start Date End Date Taking? Authorizing Provider  diphenhydrAMINE (BENADRYL) 2 % cream Apply 1 application topically daily as needed for itching.    [provider]  DiphenhydrAMINE Citrate (BENADRYL ALLERGY FASTMELT PO) Take 25 mg by mouth every 8 (eight) hours as needed (allergies and itching).    [provider]  EPINEPHrine 0.3 mg/0.3 mL IJ SOAJ injection Inject 0.3 mLs (0.3 mg total) into the muscle once. 04/27/15   Bobbitt, Heywood Iles, MD  famotidine (PEPCID) 20 MG tablet Take 20 mg by mouth daily as needed for heartburn.     [provider]  metoprolol succinate (TOPROL-XL) 25 MG 24 hr tablet Take 0.5 tablets (12.5 mg total) by mouth 2 (two) times daily. Please keep upcoming  appt in March with Dr. Ladona Ridgelaylor before anymore refills. Thank you 05/21/19   Sheilah PigeonUrsuy, Renee Lynn, PA-C  PROAIR HFA 108 [phone](90 Base) MCG/ACT inhaler Inhale 2 puffs into the lungs every 6 (six) hours as needed for shortness of breath. 06/08/16   [provider]  SYNTHROID 100 MCG tablet Take 1 tablet by mouth daily. 06/27/18   [provider]    Allergies:   Azithromycin, Codeine, Ivp dye [iodinated diagnostic agents], Natamycin, Other, Rose, Shellfish allergy, Thimerosol [thimerosal], Strawberry extract, Adhesive [tape],  Avelox [moxifloxacin hcl in nacl], Bacitracin, Cinnamon, Ginger, Neomycin, Onion, Sunscreens [aminobenzoate], Watermelon [citrullus vulgaris], Benzalkonium chloride, Cephalosporins, and Penicillins   Social History   Socioeconomic History  . Marital status: Married    Spouse name: Not on file  . Number of children: Not on file  . Years of education: Not on file  . Highest education level: Not on file  Occupational History  . Not on file  Tobacco Use  . Smoking status: Never Smoker  . Smokeless tobacco: Never Used  Substance and Sexual Activity  . Alcohol use: Yes    Alcohol/week: 1.0 standard drinks    Types: 1 Glasses of wine per week  . Drug use: No  . Sexual activity: Yes  Other Topics Concern  . Not on file  Social History Narrative  . Not on file   Social Determinants of Health   Financial Resource Strain:   . Difficulty of Paying Living Expenses: Not on file  Food Insecurity:   . Worried About Programme researcher, broadcasting/film/videounning Out of Food in the Last Year: Not on file  . Ran Out of Food in the Last Year: Not on file  Transportation Needs:   . Lack of Transportation (Medical): Not on file  . Lack of Transportation (Non-Medical): Not on file  Physical Activity:   . Days of Exercise per Week: Not on file  . Minutes of Exercise per Session: Not on file  Stress:   . Feeling of Stress : Not on file  Social Connections:   . Frequency of Communication with Friends and Family: Not on file  . Frequency of Social Gatherings with Friends and Family: Not on file  . Attends Religious Services: Not on file  . Active Member of Clubs or Organizations: Not on file  . Attends BankerClub or Organization Meetings: Not on file  . Marital Status: Not on file     Family History:  The patient's family history includes Allergic rhinitis in her brother, father, mother, and sister; Angioedema in her sister; Asthma in her brother and father; Urticaria in her mother and sister.  ROS:   Please see the history of present  illness.    ROS All other systems reviewed and are negative.   PHYSICAL EXAM:   VS:  BP 110/72   Pulse 71   Ht 5\' 5"  (1.651 m)   Wt 186 lb (84.4 kg)   SpO2 100%   BMI 30.95 kg/m    GEN: Well nourished, well developed, in no acute distress  HEENT: normal  Neck: no JVD, carotid bruits, or masses Cardiac: RRR; no murmurs, rubs, or gallops,no edema  Respiratory:  clear to auscultation bilaterally, normal work of breathing GI: soft, nontender, nondistended, + BS MS: no deformity or atrophy  Skin: warm and dry, no rash Neuro:  Alert and Oriented x 3, Strength and sensation are intact Psych: euthymic mood, full affect  Wt Readings from Last 3 Encounters:  06/19/19 186 lb (84.4 kg)  02/08/18 170  lb (77.1 kg)  08/11/17 163 lb 6.4 oz (74.1 kg)      Studies/Labs Reviewed:   EKG:  EKG is ordered today.  The ekg ordered today demonstrates normal sinus rhythm at rate of 71 bpm  Recent Labs: No results found for requested labs within last 8760 hours.   Lipid Panel    Component Value Date/Time   CHOL (H) 03/11/2009 1950    227        ATP III CLASSIFICATION:  <200     mg/dL   Desirable  373-428  mg/dL   Borderline High  >=768    mg/dL   High          TRIG 115 (H) 03/11/2009 1950   HDL 31 (L) 03/11/2009 1950   CHOLHDL 7.3 03/11/2009 1950   VLDL 40 03/11/2009 1950   LDLCALC (H) 03/11/2009 1950    156        Total Cholesterol/HDL:CHD Risk Coronary Heart Disease Risk Table                     Men   Women  1/2 Average Risk   3.4   3.3  Average Risk       5.0   4.4  2 X Average Risk   9.6   7.1  3 X Average Risk  23.4   11.0        Use the calculated Patient Ratio above and the CHD Risk Table to determine the patient's CHD Risk.        ATP III CLASSIFICATION (LDL):  <100     mg/dL   Optimal  726-203  mg/dL   Near or Above                    Optimal  130-159  mg/dL   Borderline  559-741  mg/dL   High  >638     mg/dL   Very High    Additional studies/ records that  were reviewed today include:   ETT 05/11/17  Blood pressure demonstrated a normal response to exercise.  No T wave inversion was noted during stress.  There was no ST segment deviation noted during stress.  Overall, the patient's exercise capacity was mildly impaired  Duke Treadmill Score: low risk   Negative stress test without evidence of ischemia at given workload.    ASSESSMENT & PLAN:    1. Chest pressure Associated with and without tachycardia.  Seems patient having angina secondary to tachycardia however symptoms occurring without tachycardia as well.  Personally discussed with Dr. Ladona Ridgel.  Get stress test to rule out ischemia.  She may need SVT ablation afterwards.  Continue current dose of beta-blocker.  Check TSH.  2. SVT -As summarized above  3. S/p ILR - Followed by EP   Medication Adjustments/Labs and Tests Ordered: Current medicines are reviewed at length with the patient today.  Concerns regarding medicines are outlined above.  Medication changes, Labs and Tests ordered today are listed in the Patient Instructions below. Patient Instructions  Medication Instructions:  Your physician recommends that you continue on your current medications as directed. Please refer to the Current Medication list given to you today.  *If you need a refill on your cardiac medications before your next appointment, please call your pharmacy*   Lab Work: TODAY:  TSH  If you have labs (blood work) drawn today and your tests are completely normal, you will receive your results only by: Marland Kitchen MyChart Message (  if you have MyChart) OR . A paper copy in the mail If you have any lab test that is abnormal or we need to change your treatment, we will call you to review the results.   Testing/Procedures: Your physician has requested that you have a lexiscan myoview ASAP. For further information please visit [email or web address]. Please follow instruction sheet, as  given.    Follow-Up: At [facility], you and your health needs are our priority.  As part of our continuing mission to provide you with exceptional heart care, we have created designated Provider Care Teams.  These Care Teams include your primary Cardiologist (physician) and Advanced Practice Providers (APPs -  Physician Assistants and Nurse Practitioners) who all work together to provide you with the care you need, when you need it.  We recommend signing up for the patient portal called "MyChart".  Sign up information is provided on this After Visit Summary.  MyChart is used to connect with patients for Virtual Visits (Telemedicine).  Patients are able to view lab/test results, encounter notes, upcoming appointments, etc.  Non-urgent messages can be sent to your provider as well.   To learn more about what you can do with MyChart, go to [email or web address].    Your next appointment:   AS SOON AFTER STRESS TEST AS CAN PER DR. Lovena Le  The format for your next appointment:   In Person  Provider:   Cristopher Peru, MD   Other Instructions  Cardiac Nuclear Scan A cardiac nuclear scan is a test that measures blood flow to the heart when a person is resting and when he or she is exercising. The test looks for problems such as:  Not enough blood reaching a portion of the heart.  The heart muscle not working normally. You may need this test if:  You have heart disease.  You have had abnormal lab results.  You have had heart surgery or a balloon procedure to open up blocked arteries (angioplasty).  You have chest pain.  You have shortness of breath. In this test, a radioactive dye (tracer) is injected into your bloodstream. After the tracer has traveled to your heart, an imaging device is used to measure how much of the tracer is absorbed by or distributed to various areas of your heart. This procedure is usually done at a hospital and takes 2-4 hours. Tell a health care provider  about:  Any allergies you have.  All medicines you are taking, including vitamins, herbs, eye drops, creams, and over-the-counter medicines.  Any problems you or family members have had with anesthetic medicines.  Any blood disorders you have.  Any surgeries you have had.  Any medical conditions you have.  Whether you are pregnant or may be pregnant. What are the risks? Generally, this is a safe procedure. However, problems may occur, including:  Serious chest pain and heart attack. This is only a risk if the stress portion of the test is done.  Rapid heartbeat.  Sensation of warmth in your chest. This usually passes quickly.  Allergic reaction to the tracer. What happens before the procedure?  Ask your health care provider about changing or stopping your regular medicines. This is especially important if you are taking diabetes medicines or blood thinners.  Follow instructions from your health care provider about eating or drinking restrictions.  Remove your jewelry on the day of the procedure. What happens during the procedure?  An IV will be inserted into one of your veins.  Your health care provider  will inject a small amount of radioactive tracer through the IV.  You will wait for 20-40 minutes while the tracer travels through your bloodstream.  Your heart activity will be monitored with an electrocardiogram (ECG).  You will lie down on an exam table.  Images of your heart will be taken for about 15-20 minutes.  You may also have a stress test. For this test, one of the following may be done: ? You will exercise on a treadmill or stationary bike. While you exercise, your heart's activity will be monitored with an ECG, and your blood pressure will be checked. ? You will be given medicines that will increase blood flow to parts of your heart. This is done if you are unable to exercise.  When blood flow to your heart has peaked, a tracer will again be injected  through the IV.  After 20-40 minutes, you will get back on the exam table and have more images taken of your heart.  Depending on the type of tracer used, scans may need to be repeated 3-4 hours later.  Your IV line will be removed when the procedure is over. The procedure may vary among health care providers and hospitals. What happens after the procedure?  Unless your health care provider tells you otherwise, you may return to your normal schedule, including diet, activities, and medicines.  Unless your health care provider tells you otherwise, you may increase your fluid intake. This will help to flush the contrast dye from your body. Drink enough fluid to keep your urine pale yellow.  Ask your health care provider, or the department that is doing the test: ? When will my results be ready? ? How will I get my results? Summary  A cardiac nuclear scan measures the blood flow to the heart when a person is resting and when he or she is exercising.  Tell your health care provider if you are pregnant.  Before the procedure, ask your health care provider about changing or stopping your regular medicines. This is especially important if you are taking diabetes medicines or blood thinners.  After the procedure, unless your health care provider tells you otherwise, increase your fluid intake. This will help flush the contrast dye from your body.  After the procedure, unless your health care provider tells you otherwise, you may return to your normal schedule, including diet, activities, and medicines. This information is not intended to replace advice given to you by your health care provider. Make sure you discuss any questions you have with your health care provider. Document Revised: 09/11/2017 Document Reviewed: 09/11/2017 Elsevier Patient Education  [address]      Vonzella Nipple [place], [place]  06/19/2019 4:28 PM    [facility] [address] [place],  [place], [place]  [address] Phone: [phone]; Fax: [phone]

## 2019-06-19 ENCOUNTER — Other Ambulatory Visit: Payer: Self-pay

## 2019-06-19 ENCOUNTER — Encounter: Payer: Self-pay | Admitting: Physician Assistant

## 2019-06-19 ENCOUNTER — Encounter: Payer: Self-pay | Admitting: *Deleted

## 2019-06-19 ENCOUNTER — Ambulatory Visit: Payer: Medicare Other | Admitting: Physician Assistant

## 2019-06-19 VITALS — BP 110/72 | HR 71 | Ht 65.0 in | Wt 186.0 lb

## 2019-06-19 DIAGNOSIS — I471 Supraventricular tachycardia: Secondary | ICD-10-CM

## 2019-06-19 DIAGNOSIS — R002 Palpitations: Secondary | ICD-10-CM

## 2019-06-19 DIAGNOSIS — R55 Syncope and collapse: Secondary | ICD-10-CM

## 2019-06-19 DIAGNOSIS — R0789 Other chest pain: Secondary | ICD-10-CM

## 2019-06-19 DIAGNOSIS — R0602 Shortness of breath: Secondary | ICD-10-CM

## 2019-06-19 DIAGNOSIS — I341 Nonrheumatic mitral (valve) prolapse: Secondary | ICD-10-CM | POA: Diagnosis not present

## 2019-06-19 DIAGNOSIS — R079 Chest pain, unspecified: Secondary | ICD-10-CM

## 2019-06-19 NOTE — Patient Instructions (Addendum)
Medication Instructions:  Your physician recommends that you continue on your current medications as directed. Please refer to the Current Medication list given to you today.  *If you need a refill on your cardiac medications before your next appointment, please call your pharmacy*   Lab Work: TODAY:  TSH  If you have labs (blood work) drawn today and your tests are completely normal, you will receive your results only by: Marland Kitchen MyChart Message (if you have MyChart) OR . A paper copy in the mail If you have any lab test that is abnormal or we need to change your treatment, we will call you to review the results.   Testing/Procedures: Your physician has requested that you have a lexiscan myoview ASAP. For further information please visit [email or web address]. Please follow instruction sheet, as given.    Follow-Up: At [facility], you and your health needs are our priority.  As part of our continuing mission to provide you with exceptional heart care, we have created designated Provider Care Teams.  These Care Teams include your primary Cardiologist (physician) and Advanced Practice Providers (APPs -  Physician Assistants and Nurse Practitioners) who all work together to provide you with the care you need, when you need it.  We recommend signing up for the patient portal called "MyChart".  Sign up information is provided on this After Visit Summary.  MyChart is used to connect with patients for Virtual Visits (Telemedicine).  Patients are able to view lab/test results, encounter notes, upcoming appointments, etc.  Non-urgent messages can be sent to your provider as well.   To learn more about what you can do with MyChart, go to [email or web address].    Your next appointment:   AS SOON AFTER STRESS TEST AS CAN PER DR. Lovena Le  The format for your next appointment:   In Person  Provider:   Cristopher Peru, MD   Other Instructions  Cardiac Nuclear Scan A cardiac nuclear scan is a test  that measures blood flow to the heart when a person is resting and when he or she is exercising. The test looks for problems such as:  Not enough blood reaching a portion of the heart.  The heart muscle not working normally. You may need this test if:  You have heart disease.  You have had abnormal lab results.  You have had heart surgery or a balloon procedure to open up blocked arteries (angioplasty).  You have chest pain.  You have shortness of breath. In this test, a radioactive dye (tracer) is injected into your bloodstream. After the tracer has traveled to your heart, an imaging device is used to measure how much of the tracer is absorbed by or distributed to various areas of your heart. This procedure is usually done at a hospital and takes 2-4 hours. Tell a health care provider about:  Any allergies you have.  All medicines you are taking, including vitamins, herbs, eye drops, creams, and over-the-counter medicines.  Any problems you or family members have had with anesthetic medicines.  Any blood disorders you have.  Any surgeries you have had.  Any medical conditions you have.  Whether you are pregnant or may be pregnant. What are the risks? Generally, this is a safe procedure. However, problems may occur, including:  Serious chest pain and heart attack. This is only a risk if the stress portion of the test is done.  Rapid heartbeat.  Sensation of warmth in your chest. This usually passes quickly.  Allergic reaction to the tracer.  What happens before the procedure?  Ask your health care provider about changing or stopping your regular medicines. This is especially important if you are taking diabetes medicines or blood thinners.  Follow instructions from your health care provider about eating or drinking restrictions.  Remove your jewelry on the day of the procedure. What happens during the procedure?  An IV will be inserted into one of your veins.  Your  health care provider will inject a small amount of radioactive tracer through the IV.  You will wait for 20-40 minutes while the tracer travels through your bloodstream.  Your heart activity will be monitored with an electrocardiogram (ECG).  You will lie down on an exam table.  Images of your heart will be taken for about 15-20 minutes.  You may also have a stress test. For this test, one of the following may be done: ? You will exercise on a treadmill or stationary bike. While you exercise, your heart's activity will be monitored with an ECG, and your blood pressure will be checked. ? You will be given medicines that will increase blood flow to parts of your heart. This is done if you are unable to exercise.  When blood flow to your heart has peaked, a tracer will again be injected through the IV.  After 20-40 minutes, you will get back on the exam table and have more images taken of your heart.  Depending on the type of tracer used, scans may need to be repeated 3-4 hours later.  Your IV line will be removed when the procedure is over. The procedure may vary among health care providers and hospitals. What happens after the procedure?  Unless your health care provider tells you otherwise, you may return to your normal schedule, including diet, activities, and medicines.  Unless your health care provider tells you otherwise, you may increase your fluid intake. This will help to flush the contrast dye from your body. Drink enough fluid to keep your urine pale yellow.  Ask your health care provider, or the department that is doing the test: ? When will my results be ready? ? How will I get my results? Summary  A cardiac nuclear scan measures the blood flow to the heart when a person is resting and when he or she is exercising.  Tell your health care provider if you are pregnant.  Before the procedure, ask your health care provider about changing or stopping your regular medicines.  This is especially important if you are taking diabetes medicines or blood thinners.  After the procedure, unless your health care provider tells you otherwise, increase your fluid intake. This will help flush the contrast dye from your body.  After the procedure, unless your health care provider tells you otherwise, you may return to your normal schedule, including diet, activities, and medicines. This information is not intended to replace advice given to you by your health care provider. Make sure you discuss any questions you have with your health care provider. Document Revised: 09/11/2017 Document Reviewed: 09/11/2017 Elsevier Patient Education  2020 [employer].

## 2019-06-20 ENCOUNTER — Telehealth (HOSPITAL_COMMUNITY): Payer: Self-pay | Admitting: *Deleted

## 2019-06-20 ENCOUNTER — Encounter (HOSPITAL_COMMUNITY): Payer: Self-pay | Admitting: *Deleted

## 2019-06-20 NOTE — Progress Notes (Deleted)
Cardiology Office Note Date:  06/20/2019  Patient ID:  Heather Carey, Heather Carey 1950-12-19, MRN [id number] PCP:  Kathyrn Lass, MD  Cardiologist:  Dr. Radford Pax Electrophysiologist: Dr. Lovena Le    Chief Complaint: ***   History of Present Illness: Heather Carey is a 69 y.o. female with history of fibromyalgia, HTN, HLD, hypothyroidism, TIA, OA,  eosinophilic esophagitis,chronic urticaria/multiple allergies (followed at [facility]), obesity, migraines, SVT  Hx of syncope x 2 in 2017. This was felt to be due to volume depletion.She has had  normal echo. Heart monitor showed NSR with nonsustained atrial tachycardia and 4 beats of WCT.  ER 04/20/17 with generalized weakness, palpitations, reports of recent GI illness.  ER MD note reports HR 170-200, SVT, vagal maneuver resolved the tachycardia, continue her metoprolol and f/u out patient. 2 nights prior to her going to the ER she mentioned she had R eye visual change with a lateral movement of blurring/change that seemed to move in/out, she saw her eye MD who thought was vitreous detachment.  She has not had any focal weakness or symptoms otherwise.  She has been having palpitations on off, the following day developed a racing sensation and much more persistent, the day she went to the ER associated with left arm numbness/tingling, and SOB.  She was found in an SVT 178bpm.  Initially adenosine was ineffective though a vagal maneuver resulted in SR and resolution of symptoms.   Her TSH was low and her PMD decreased her synthroid dose.    She saw V. Bhagat, PA-C 06/19/19 with an episode of SVT after her COVID shot EMS was called and found to be in SVT.  Declined further evaluation in ER.  Patient reports intermittent tachycardia lasting for minutes to hours.  She has chest pressure associated with shortness of breath.  Her chest pressure occurs with and without tachycardia.  She takes metoprolol succinate 12.5 mg in the morning.  She is not taking evening dose as it  makes her severely fatigued and tired next day. In d/w Dr. Lovena Le, planned to continue BB, check TSH, get a stress test and see him in f/u to discuss ablation.    *** Symptoms *** TSH, labs results *** stress, scheduled? *** meds, take BB BID...Marland Kitchen. why isn't she??     Device information MDT ILR, implanted 05/08/2017, cryptogenic stroke   Past Medical History:  Diagnosis Date  . Diverticulitis   . Eosinophilic esophagitis   . Fibromyalgia   . GERD (gastroesophageal reflux disease)   . Hyperlipidemia   . Hypertension   . Hypothyroidism   . IBS (irritable bowel syndrome)   . Migraines   . Mitral regurgitation 02/28/2014   Mild to moderate by echo 02/2014  . Obesity   . Osteoarthritis   . Otitis externa   . Palpitations   . TIA (transient ischemic attack)     Past Surgical History:  Procedure Laterality Date  . ABDOMINAL HYSTERECTOMY    . carpel tunnel    . lbp gso    . LOOP RECORDER INSERTION N/A 05/08/2017   Procedure: LOOP RECORDER INSERTION;  Surgeon: Evans Lance, MD;  Location: [facility];  Service: Cardiovascular;  Laterality: N/A;  . TONSILLECTOMY AND ADENOIDECTOMY      Current Outpatient Medications  Medication Sig Dispense Refill  . diphenhydrAMINE (BENADRYL) 2 % cream Apply 1 application topically daily as needed for itching.    . DiphenhydrAMINE Citrate (BENADRYL ALLERGY FASTMELT PO) Take 25 mg by mouth every 8 (eight) hours as  needed (allergies and itching).    . EPINEPHrine 0.3 mg/0.3 mL IJ SOAJ injection Inject 0.3 mLs (0.3 mg total) into the muscle once. 2 Device 2  . famotidine (PEPCID) 20 MG tablet Take 20 mg by mouth daily as needed for heartburn.     . metoprolol succinate (TOPROL-XL) 25 MG 24 hr tablet Take 12.5 mg by mouth daily.    Marland Kitchen PROAIR HFA 108 (90 Base) MCG/ACT inhaler Inhale 2 puffs into the lungs every 6 (six) hours as needed for shortness of breath.  0  . SYNTHROID 100 MCG tablet Take 1 tablet by mouth daily.     No current  facility-administered medications for this visit.    Allergies:   Azithromycin, Codeine, Ivp dye [iodinated diagnostic agents], Natamycin, Other, Rose, Shellfish allergy, Thimerosol [thimerosal], Strawberry extract, Adhesive [tape], Avelox [moxifloxacin hcl in nacl], Bacitracin, Cinnamon, Ginger, Neomycin, Onion, Sunscreens [aminobenzoate], Watermelon [citrullus vulgaris], Benzalkonium chloride, Cephalosporins, and Penicillins   Social History:  The patient  reports that she has never smoked. She has never used smokeless tobacco. She reports current alcohol use of about 1.0 standard drinks of alcohol per week. She reports that she does not use drugs.   Family History:  The patient's family history includes Allergic rhinitis in her brother, father, mother, and sister; Angioedema in her sister; Asthma in her brother and father; Urticaria in her mother and sister.  ROS:  Please see the history of present illness.  All other systems are reviewed and otherwise negative.   PHYSICAL EXAM:  VS:  There were no vitals taken for this visit. BMI: There is no height or weight on file to calculate BMI. Well nourished, well developed, in no acute distress  HEENT: normocephalic, atraumatic  Neck: no JVD, carotid bruits or masses Cardiac: *** RRR; no significant murmurs, no rubs, or gallops Lungs: *** CTA b/l, no wheezing, rhonchi or rales  Abd: soft, nontender MS: no deformity or *** atrophy Ext:  *** no edema  Skin: warm and dry, she has large areas of skin erythema irritation where her ECG leads were, she state improviong, had been large hives/weeping.  Skin appears intact. Neuro:  No gross deficits appreciated Psych: euthymic mood, full affect  *** ILR site: stable, no skin changes, tethering, tenderness   EKG:  Not done today   ETT 05/11/17  Blood pressure demonstrated a normal response to exercise.  No T wave inversion was noted during stress.  There was no ST segment deviation noted during  stress.  Overall, the patient's exercise capacity was mildly impaired  [facility] Treadmill Score: low risk  Negative stress test without evidence of ischemia at given workload.   03/21/16: TTE Study Conclusions - Left ventricle: The cavity size was normal. Systolic function was   normal. The estimated ejection fraction was in the range of 55%   to 60%. Wall motion was normal; there were no regional wall   motion abnormalities. The pulmonary vein flow pattern was normal.   The tissue Doppler parameters were normal. Left ventricular   diastolic function parameters were normal. - Aortic valve: There was no regurgitation. - Aortic root: The aortic root was normal in size. - Mitral valve: Structurally normal valve. There was mild   regurgitation. - Left atrium: The atrium was at the upper limits of normal in   size. - Right ventricle: The cavity size was normal. Wall thickness was   normal. Systolic function was normal. - Right atrium: The atrium was normal in size. - Tricuspid valve:  There was mild regurgitation. - Pulmonary arteries: Systolic pressure was within the normal   range. - Inferior vena cava: The vessel was normal in size. - Pericardium, extracardiac: There was no pericardial effusion.  Recent Labs: No results found for requested labs within last 8760 hours.  No results found for requested labs within last 8760 hours.   CrCl cannot be calculated (Patient's most recent lab result is older than the maximum 21 days allowed.).   Wt Readings from Last 3 Encounters:  06/19/19 186 lb (84.4 kg)  02/08/18 170 lb (77.1 kg)  08/11/17 163 lb 6.4 oz (74.1 kg)     Other studies reviewed: Additional studies/records reviewed today include: summarized above  ASSESSMENT AND PLAN:  1. SVT     ***   2. HTN     *** Looks good  3. CP     ***    Disposition: ***  Current medicines are reviewed at length with the patient today.  The patient did not have any concerns  regarding medicines.  Norma Fredrickson, PA-C 06/20/2019 6:12 AM     [facility] [address] [place] [address] [phone] (office)  [phone] (fax)

## 2019-06-20 NOTE — Telephone Encounter (Signed)
Attempted to call patient unable to leave a message, mailbox full, my chart letter sent.  Ricky Ala

## 2019-06-21 ENCOUNTER — Encounter: Payer: Medicare Other | Admitting: Physician Assistant

## 2019-06-24 ENCOUNTER — Ambulatory Visit (HOSPITAL_COMMUNITY): Payer: Medicare Other | Attending: Cardiology

## 2019-06-24 ENCOUNTER — Other Ambulatory Visit: Payer: Self-pay

## 2019-06-24 DIAGNOSIS — R079 Chest pain, unspecified: Secondary | ICD-10-CM | POA: Diagnosis not present

## 2019-06-24 LAB — MYOCARDIAL PERFUSION IMAGING
LV dias vol: 80 mL (ref 46–106)
LV sys vol: 29 mL
Peak HR: 117 {beats}/min
Rest HR: 72 {beats}/min
SDS: 0
SRS: 0
SSS: 0
TID: 0.95

## 2019-06-24 LAB — TSH: TSH: 0.277 u[IU]/mL — ABNORMAL LOW (ref 0.450–4.500)

## 2019-06-24 MED ORDER — TECHNETIUM TC 99M TETROFOSMIN IV KIT
9.6000 | PACK | Freq: Once | INTRAVENOUS | Status: AC | PRN
  Administered 2019-06-24: 9.6 via INTRAVENOUS
  Filled 2019-06-24: qty 10

## 2019-06-24 MED ORDER — REGADENOSON 0.4 MG/5ML IV SOLN
0.4000 mg | Freq: Once | INTRAVENOUS | Status: AC
  Administered 2019-06-24: 0.4 mg via INTRAVENOUS

## 2019-06-24 MED ORDER — TECHNETIUM TC 99M TETROFOSMIN IV KIT
31.1000 | PACK | Freq: Once | INTRAVENOUS | Status: AC | PRN
  Administered 2019-06-24: 31.1 via INTRAVENOUS
  Filled 2019-06-24: qty 32

## 2019-06-25 ENCOUNTER — Encounter: Payer: Medicare Other | Admitting: Internal Medicine

## 2019-07-03 ENCOUNTER — Encounter: Payer: Self-pay | Admitting: Internal Medicine

## 2019-07-03 ENCOUNTER — Ambulatory Visit: Payer: Medicare Other | Admitting: Internal Medicine

## 2019-07-03 ENCOUNTER — Other Ambulatory Visit: Payer: Self-pay

## 2019-07-03 VITALS — BP 102/67 | HR 71 | Temp 98.5°F | Ht 65.5 in | Wt 188.0 lb

## 2019-07-03 DIAGNOSIS — R55 Syncope and collapse: Secondary | ICD-10-CM | POA: Diagnosis not present

## 2019-07-03 DIAGNOSIS — I471 Supraventricular tachycardia: Secondary | ICD-10-CM

## 2019-07-03 DIAGNOSIS — I639 Cerebral infarction, unspecified: Secondary | ICD-10-CM

## 2019-07-03 LAB — CUP PACEART INCLINIC DEVICE CHECK
Date Time Interrogation Session: 20210324110901
Implantable Pulse Generator Implant Date: 20190129

## 2019-07-03 NOTE — Patient Instructions (Signed)
Medication Instructions:  Your physician recommends that you continue on your current medications as directed. Please refer to the Current Medication list given to you today.  *If you need a refill on your cardiac medications before your next appointment, please call your pharmacy*   Lab Work: NONE   If you have labs (blood work) drawn today and your tests are completely normal, you will receive your results only by: . MyChart Message (if you have MyChart) OR . A paper copy in the mail If you have any lab test that is abnormal or we need to change your treatment, we will call you to review the results.   Testing/Procedures: NONE    Follow-Up: At [facility], you and your health needs are our priority.  As part of our continuing mission to provide you with exceptional heart care, we have created designated Provider Care Teams.  These Care Teams include your primary Cardiologist (physician) and Advanced Practice Providers (APPs -  Physician Assistants and Nurse Practitioners) who all work together to provide you with the care you need, when you need it.  We recommend signing up for the patient portal called "MyChart".  Sign up information is provided on this After Visit Summary.  MyChart is used to connect with patients for Virtual Visits (Telemedicine).  Patients are able to view lab/test results, encounter notes, upcoming appointments, etc.  Non-urgent messages can be sent to your provider as well.   To learn more about what you can do with MyChart, go to [email or web address].    Your next appointment:   1 year(s)  The format for your next appointment:   In Person  Provider:   Gregg Taylor, MD   Other Instructions Thank you for choosing [facility]!    

## 2019-07-03 NOTE — Progress Notes (Signed)
HPI Heather Carey returns today for ongoing evaluation and management of her SVT. She has a remote h/o cryptogenic stroke and is s/p ILR insertion. She has episodes of SVT which she can usually terminate with vagal maneuvers. Her bp has been low. She went into SVT 5 minutes after receiving her first [place] vaccine. She did not go to the ED. She feels like her energy level is reduced since the first vaccine.  Allergies  Allergen Reactions  . Azithromycin Anaphylaxis  . Codeine Shortness Of Breath  . Ivp Dye [Iodinated Diagnostic Agents] Anaphylaxis  . Natamycin Other (See Comments)    Throat Swelling  . Other Anaphylaxis, Hives, Swelling and Other (See Comments)    Irregular heart beat Triple Antibiotic Ointment Nuts - Anaphylaxis  Berries - itching and swelling   . Rose Hives  . Shellfish Allergy Anaphylaxis  . Thimerosol [Thimerosal] Anaphylaxis  . Strawberry Extract Swelling    Pt reports throat swelling and itching with any type of berry  . Adhesive [Tape] Hives    Paper tape is ok  . Avelox [Moxifloxacin Hcl In Nacl]     Irregular heart beat  . Bacitracin Hives and Itching  . Cinnamon Hives  . Ginger Hives and Swelling  . Neomycin Hives  . Onion     MOUTH SWELLING  . Sunscreens [Aminobenzoate] Hives and Swelling    Avobenzene and Oxybenzene  . Watermelon [Citrullus Vulgaris]     Tongue swelling   . Benzalkonium Chloride Rash  . Cephalosporins Hives and Rash  . Penicillins     Childhood allergy Has patient had a PCN reaction causing immediate rash, facial/tongue/throat swelling, SOB or lightheadedness with hypotension: Unknown Has patient had a PCN reaction causing severe rash involving mucus membranes or skin necrosis: Unknown Has patient had a PCN reaction that required hospitalization: No Has patient had a PCN reaction occurring within the last 10 years: No If all of the above answers are "NO", then may proceed with Cephalosporin use.      Current  Outpatient Medications  Medication Sig Dispense Refill  . diphenhydrAMINE (BENADRYL) 2 % cream Apply 1 application topically daily as needed for itching.    . DiphenhydrAMINE Citrate (BENADRYL ALLERGY FASTMELT PO) Take 25 mg by mouth every 8 (eight) hours as needed (allergies and itching).    . famotidine (PEPCID) 20 MG tablet Take 20 mg by mouth daily as needed for heartburn.     . levothyroxine (SYNTHROID) 88 MCG tablet Take 88 mcg by mouth daily before breakfast.    . metoprolol succinate (TOPROL-XL) 25 MG 24 hr tablet Take 12.5 mg by mouth daily.    Marland Kitchen PROAIR HFA 108 (90 Base) MCG/ACT inhaler Inhale 2 puffs into the lungs every 6 (six) hours as needed for shortness of breath.  0  . EPINEPHrine 0.3 mg/0.3 mL IJ SOAJ injection Inject 0.3 mLs (0.3 mg total) into the muscle once. 2 Device 2   No current facility-administered medications for this visit.     Past Medical History:  Diagnosis Date  . Diverticulitis   . Eosinophilic esophagitis   . Fibromyalgia   . GERD (gastroesophageal reflux disease)   . Hyperlipidemia   . Hypertension   . Hypothyroidism   . IBS (irritable bowel syndrome)   . Migraines   . Mitral regurgitation 02/28/2014   Mild to moderate by echo 02/2014  . Obesity   . Osteoarthritis   . Otitis externa   . Palpitations   . TIA (transient  ischemic attack)     ROS:   All systems reviewed and negative except as noted in the HPI.   Past Surgical History:  Procedure Laterality Date  . ABDOMINAL HYSTERECTOMY    . carpel tunnel    . lbp gso    . LOOP RECORDER INSERTION N/A 05/08/2017   Procedure: LOOP RECORDER INSERTION;  Surgeon: Marinus Maw, MD;  Location: [facility];  Service: Cardiovascular;  Laterality: N/A;  . TONSILLECTOMY AND ADENOIDECTOMY       Family History  Problem Relation Age of Onset  . Allergic rhinitis Mother   . Urticaria Mother   . Allergic rhinitis Father   . Asthma Father   . Allergic rhinitis Sister   . Angioedema  Sister   . Urticaria Sister   . Allergic rhinitis Brother   . Asthma Brother      Social History   Socioeconomic History  . Marital status: Married    Spouse name: Not on file  . Number of children: Not on file  . Years of education: Not on file  . Highest education level: Not on file  Occupational History  . Not on file  Tobacco Use  . Smoking status: Never Smoker  . Smokeless tobacco: Never Used  Substance and Sexual Activity  . Alcohol use: Yes    Alcohol/week: 1.0 standard drinks    Types: 1 Glasses of wine per week  . Drug use: No  . Sexual activity: Yes  Other Topics Concern  . Not on file  Social History Narrative  . Not on file   Social Determinants of Health   Financial Resource Strain:   . Difficulty of Paying Living Expenses:   Food Insecurity:   . Worried About Programme researcher, broadcasting/film/video in the Last Year:   . Barista in the Last Year:   Transportation Needs:   . Freight forwarder (Medical):   Marland Kitchen Lack of Transportation (Non-Medical):   Physical Activity:   . Days of Exercise per Week:   . Minutes of Exercise per Session:   Stress:   . Feeling of Stress :   Social Connections:   . Frequency of Communication with Friends and Family:   . Frequency of Social Gatherings with Friends and Family:   . Attends Religious Services:   . Active Member of Clubs or Organizations:   . Attends Banker Meetings:   Marland Kitchen Marital Status:   Intimate Partner Violence:   . Fear of Current or Ex-Partner:   . Emotionally Abused:   Marland Kitchen Physically Abused:   . Sexually Abused:      BP 102/67   Pulse 71   Temp 98.5 F (36.9 C)   Ht 5' 5.5" (1.664 m)   Wt 188 lb (85.3 kg)   BMI 30.81 kg/m   Physical Exam:  Well appearing NAD HEENT: Unremarkable Neck:  No JVD, no thyromegally Lymphatics:  No adenopathy Back:  No CVA tenderness Lungs:  Clear with no wheezes HEART:  Regular rate rhythm, no murmurs, no rubs, no clicks Abd:  soft, positive bowel  sounds, no organomegally, no rebound, no guarding Ext:  2 plus pulses, no edema, no cyanosis, no clubbing Skin:  No rashes no nodules Neuro:  CN II through XII intact, motor grossly intact   DEVICE  Normal device function.  See PaceArt for details.   Assess/Plan: 1. SVT - we discussed its affect on her life, catheter ablation and medical therapy. She will undergo  watchful waiting and take her low dose toprol. 2. HTN - her bp has been running low.  3. Sob - her stress test was negative. Her EF is normal. 4. Covid - she can undergo a second vaccine. I told her to take her toprol about an hour before the vaccine.  Heather Carey.D.

## 2019-07-04 ENCOUNTER — Ambulatory Visit (INDEPENDENT_AMBULATORY_CARE_PROVIDER_SITE_OTHER): Payer: Medicare Other | Admitting: *Deleted

## 2019-07-04 DIAGNOSIS — R55 Syncope and collapse: Secondary | ICD-10-CM | POA: Diagnosis not present

## 2019-07-04 LAB — CUP PACEART REMOTE DEVICE CHECK
Date Time Interrogation Session: 20210325012653
Implantable Pulse Generator Implant Date: 20190129

## 2019-07-04 NOTE — Progress Notes (Signed)
ILR Remote 

## 2019-07-10 ENCOUNTER — Ambulatory Visit: Payer: Medicare Other | Admitting: Physician Assistant

## 2019-07-31 ENCOUNTER — Ambulatory Visit: Payer: Medicare Other | Attending: Internal Medicine

## 2019-07-31 DIAGNOSIS — Z23 Encounter for immunization: Secondary | ICD-10-CM

## 2019-07-31 NOTE — Progress Notes (Signed)
   Covid-19 Vaccination Clinic  Name:  Heather Carey    MRN: [id number] DOB: 07/29/1950  07/31/2019  [name] was observed post Covid-19 immunization for 30 minutes based on pre-vaccination screening without incident. She was provided with Vaccine Information Sheet and instruction to access the V-Safe system.   [name] was instructed to call 911 with any severe reactions post vaccine: Marland Kitchen Difficulty breathing  . Swelling of face and throat  . A fast heartbeat  . A bad rash all over body  . Dizziness and weakness   Immunizations Administered    Name Date Dose VIS Date Route   Pfizer COVID-19 Vaccine 07/31/2019  3:20 PM 0.3 mL 06/05/2018 Intramuscular   Manufacturer: [employer], [employer]   Lot: [id number]   NDC: [address]-0

## 2019-08-05 LAB — CUP PACEART REMOTE DEVICE CHECK
Date Time Interrogation Session: 20210425012713
Implantable Pulse Generator Implant Date: 20190129

## 2019-08-06 ENCOUNTER — Ambulatory Visit (INDEPENDENT_AMBULATORY_CARE_PROVIDER_SITE_OTHER): Payer: Medicare Other | Admitting: *Deleted

## 2019-08-06 DIAGNOSIS — R55 Syncope and collapse: Secondary | ICD-10-CM

## 2019-08-07 NOTE — Progress Notes (Signed)
ILR Remote 

## 2019-08-17 ENCOUNTER — Other Ambulatory Visit: Payer: Self-pay | Admitting: Physician Assistant

## 2019-08-30 ENCOUNTER — Telehealth: Payer: Self-pay | Admitting: Internal Medicine

## 2019-08-30 ENCOUNTER — Other Ambulatory Visit: Payer: Self-pay

## 2019-08-30 ENCOUNTER — Encounter (HOSPITAL_COMMUNITY): Payer: Self-pay | Admitting: Emergency Medicine

## 2019-08-30 ENCOUNTER — Emergency Department (HOSPITAL_BASED_OUTPATIENT_CLINIC_OR_DEPARTMENT_OTHER)
Admit: 2019-08-30 | Discharge: 2019-08-30 | Disposition: A | Discharge status: Home / Self Care | Payer: Medicare Other | Attending: Emergency Medicine | Admitting: Emergency Medicine

## 2019-08-30 ENCOUNTER — Emergency Department (HOSPITAL_COMMUNITY)
Admission: EM | Admit: 2019-08-30 | Discharge: 2019-08-30 | Disposition: A | Discharge status: Home / Self Care | Payer: Medicare Other | Attending: Emergency Medicine | Admitting: Emergency Medicine

## 2019-08-30 DIAGNOSIS — I10 Essential (primary) hypertension: Secondary | ICD-10-CM | POA: Insufficient documentation

## 2019-08-30 DIAGNOSIS — R52 Pain, unspecified: Secondary | ICD-10-CM | POA: Diagnosis not present

## 2019-08-30 DIAGNOSIS — Z79899 Other long term (current) drug therapy: Secondary | ICD-10-CM | POA: Insufficient documentation

## 2019-08-30 DIAGNOSIS — G43909 Migraine, unspecified, not intractable, without status migrainosus: Secondary | ICD-10-CM

## 2019-08-30 DIAGNOSIS — E039 Hypothyroidism, unspecified: Secondary | ICD-10-CM | POA: Diagnosis not present

## 2019-08-30 DIAGNOSIS — M79604 Pain in right leg: Secondary | ICD-10-CM | POA: Diagnosis not present

## 2019-08-30 LAB — BASIC METABOLIC PANEL
Anion gap: 8 (ref 5–15)
BUN: 10 mg/dL (ref 8–23)
CO2: 26 mmol/L (ref 22–32)
Calcium: 8.7 mg/dL — ABNORMAL LOW (ref 8.9–10.3)
Chloride: 103 mmol/L (ref 98–111)
Creatinine, Ser: 0.82 mg/dL (ref 0.44–1.00)
GFR calc Af Amer: >60 mL/min (ref >60)
GFR calc non Af Amer: >60 mL/min (ref >60)
Glucose, Bld: 112 mg/dL — ABNORMAL HIGH (ref 70–99)
Potassium: 3.5 mmol/L (ref 3.5–5.1)
Sodium: 137 mmol/L (ref 135–145)

## 2019-08-30 LAB — CBC
HCT: 38.2 % (ref 36.0–46.0)
Hemoglobin: 12.6 g/dL (ref 12.0–15.0)
MCH: 30.8 pg (ref 26.0–34.0)
MCHC: 33 g/dL (ref 30.0–36.0)
MCV: 93.4 fL (ref 80.0–100.0)
Platelets: 210 10*3/uL (ref 150–400)
RBC: 4.09 MIL/uL (ref 3.87–5.11)
RDW: 12.7 % (ref 11.5–15.5)
WBC: 6.8 10*3/uL (ref 4.0–10.5)
nRBC: 0 % (ref 0.0–0.2)

## 2019-08-30 MED ORDER — ACETAMINOPHEN 325 MG PO TABS
650.0000 mg | ORAL_TABLET | Freq: Once | ORAL | Status: AC
  Administered 2019-08-30: 650 mg via ORAL
  Filled 2019-08-30: qty 2

## 2019-08-30 MED ORDER — SODIUM CHLORIDE 0.9% FLUSH: 3.0000 mL | Freq: Once | INTRAVENOUS | Status: DC

## 2019-08-30 NOTE — Telephone Encounter (Signed)
New message   STAT if patient feels like he/she is going to faint   1) Are you dizzy now? Yes   2) Do you feel faint or have you passed out? Patient states that she felt like she was going to pass out last night   3) Do you have any other symptoms?extreme pain in lower right leg, dizziness and lightheaded   4) Have you checked your HR and BP (record if available)? no

## 2019-08-30 NOTE — Telephone Encounter (Signed)
Attempted to call the patient.. No answer, went straight to voicemail and unable to leave a message because the VM box is full.

## 2019-08-30 NOTE — Telephone Encounter (Signed)
Called the patient back who states she had chest pressure, lightheaded and dizziness last night. She thinks that she was tachycardic but unable to check her BP/HR at this time. She is also concerned about a potential blood clot in her right leg because it has been causing her extreme pain. She denies any swelling or redness at the site of pain. Advised the patient to go to the ED to be evaluated in person. She verbalized understanding.

## 2019-08-30 NOTE — Telephone Encounter (Signed)
Accessed pt's chart to see who called her. Reached out to [facility] via secure chat and then transferred the call.

## 2019-08-30 NOTE — Telephone Encounter (Signed)
Attempted to call patient x2-staight to voicemail with no ringing and unable to leave a message due to VM being full

## 2019-08-30 NOTE — ED Provider Notes (Signed)
Medical screening examination/treatment/procedure(s) were conducted as a shared visit with non-physician practitioner(s) and myself.  I personally evaluated the patient during the encounter. Briefly, the patient is a 69 y.o. female with history of migraines, TIA, fibromyalgia, SVT who presents to the ED with right calf pain.  Patient had episode of severe right calf pain overnight.  Similar episode the other day as well.  Has had some right-sided headache as well.  Slightly worse than her normal migraines but those symptoms have improved over the course of the day.  Usually takes Tylenol for headaches but has not had any today.  Patient has normal neurological exam.  No concern for stroke.  Did not hit her head and no trauma.  DVT study is negative for clot.  Electrolytes are unremarkable.  She has good pulses in her feet bilaterally and doubt peripheral arterial disease.  Overall likely muscle spasms.  Patient was given Tylenol.  She is given reassurance and recommend follow-up with primary care doctor.  No concern for acute emergent process at this time.  This chart was dictated using voice recognition software.  Despite best efforts to proofread,  errors can occur which can change the documentation meaning.     EKG Interpretation  Date/Time:  Friday Aug 30 2019 12:13:10 EDT Ventricular Rate:  83 PR Interval:  136 QRS Duration: 78 QT Interval:  364 QTC Calculation: 427 R Axis:   18 Text Interpretation: Normal sinus rhythm Nonspecific ST abnormality Abnormal ECG Confirmed by Virgina Norfolk [phone]) on 08/30/2019 5:49:57 PM           Virgina Norfolk, DO 08/30/19 1821

## 2019-08-30 NOTE — ED Triage Notes (Signed)
Pt arrives to ED with c/o of waking up suddenly around 1am with very bad right calf pain she states she stood up to take some medication and suddenly felt faint and had tightness in her chest.

## 2019-08-30 NOTE — Progress Notes (Signed)
Right lower extremity venous duplex completed. Refer to "CV Proc" under chart review to view preliminary results.  08/30/2019 1:03 PM Eula Fried., MHA, RVT, RDCS, RDMS

## 2019-08-30 NOTE — Discharge Instructions (Signed)
Continue to drink plenty of fluids,  continue to take Tylenol for headaches. Your work-up today was overall reassuring, there is no evidence of clot in your legs. Follow up with your primary care doctor if your symptoms do not improve. Return to the ER if your symptoms worsen.

## 2019-08-30 NOTE — Telephone Encounter (Signed)
Rerouted to [employer]

## 2019-08-30 NOTE — ED Provider Notes (Signed)
[facility] Provider Note   CSN: [id number] Arrival date & time: 08/30/19  1156     History Chief Complaint  Patient presents with  . Leg Pain  . Near Syncope    Heather Carey is a 69 y.o. female.  HPI 69 year old female with history of migraines, IBS, hypertension, hypothyroidism, TIA, fibromyalgia, hyperlipidemia, obesity, mitral regurg, presents to the ER after waking up this morning with severe right calf pain, with subsequent shortness of breath and chest pressure.  She states that this is happened once before, but she woke up with severe right calf pain with associated shortness of breath and chest tightness.  She states that she has a history of SVT and felt like her heart was racing when she woke up.  She states that she has some breathing exercises that she does to decrease her heart rate, and around 3:30 AM she checked her heart rate and it was in the 80s.  She also endorses a headache which has been fairly constant all day, states it is temporal and she feels like she has pressure behind her right eye.  She states that she has mildly decreased vision in her right eye and has not noted any new visual disturbances or changes.  She states that she has a history of migraines but this headache feels slightly different as her headaches are normally frontal and are associated with aura.  She states that she no longer has chest pain and shortness of breath, but her headache lingers.  She has not noticed any unilateral swelling, no recent travel or surgeries.  She denies any fevers, chills, nausea, vomiting, abdominal pain, back pain, chest pain, shortness of breath in the ER.    Past Medical History:  Diagnosis Date  . Diverticulitis   . Eosinophilic esophagitis   . Fibromyalgia   . GERD (gastroesophageal reflux disease)   . Hyperlipidemia   . Hypertension   . Hypothyroidism   . IBS (irritable bowel syndrome)   . Migraines   . Mitral  regurgitation 02/28/2014   Mild to moderate by echo 02/2014  . Obesity   . Osteoarthritis   . Otitis externa   . Palpitations   . TIA (transient ischemic attack)     Patient Active Problem List   Diagnosis Date Noted  . Malnutrition of moderate degree 03/09/2016  . Syncope 03/08/2016  . Sensory disturbance 03/08/2016  . Hypothyroidism 03/08/2016  . Urticaria with associated angioedema 04/27/2015  . Angioedema 04/27/2015  . Allergic reaction 01/07/2015  . Food allergy 01/07/2015  . Mitral regurgitation 02/28/2014  . MVP (mitral valve prolapse) 02/24/2014  . SOB (shortness of breath) 02/24/2014  . Snoring 02/24/2014  . Hypertension   . Hyperlipidemia   . Chest pain 02/10/2014    Past Surgical History:  Procedure Laterality Date  . ABDOMINAL HYSTERECTOMY    . carpel tunnel    . lbp gso    . LOOP RECORDER INSERTION N/A 05/08/2017   Procedure: LOOP RECORDER INSERTION;  Surgeon: Evans Lance, MD;  Location: [facility];  Service: Cardiovascular;  Laterality: N/A;  . TONSILLECTOMY AND ADENOIDECTOMY       OB History   No obstetric history on file.     Family History  Problem Relation Age of Onset  . Allergic rhinitis Mother   . Urticaria Mother   . Allergic rhinitis Father   . Asthma Father   . Allergic rhinitis Sister   . Angioedema Sister   .  Urticaria Sister   . Allergic rhinitis Brother   . Asthma Brother     Social History   Tobacco Use  . Smoking status: Never Smoker  . Smokeless tobacco: Never Used  Substance Use Topics  . Alcohol use: Yes    Alcohol/week: 1.0 standard drinks    Types: 1 Glasses of wine per week  . Drug use: No    Home Medications Prior to Admission medications   Medication Sig Start Date End Date Taking? Authorizing Provider  diphenhydrAMINE (BENADRYL) 2 % cream Apply 1 application topically daily as needed for itching.    [provider]  DiphenhydrAMINE Citrate (BENADRYL ALLERGY FASTMELT PO) Take 25 mg by  mouth every 8 (eight) hours as needed (allergies and itching).    [provider]  EPINEPHrine 0.3 mg/0.3 mL IJ SOAJ injection Inject 0.3 mLs (0.3 mg total) into the muscle once. 04/27/15   Bobbitt, Heywood Ilesalph Carter, MD  famotidine (PEPCID) 20 MG tablet Take 20 mg by mouth daily as needed for heartburn.     [provider]  levothyroxine (SYNTHROID) 88 MCG tablet Take 88 mcg by mouth daily before breakfast.    [provider]  metoprolol succinate (TOPROL-XL) 25 MG 24 hr tablet TAKE 1/2 TABLET (12.5 MG TOTAL) BY MOUTH 2 (TWO) TIMES DAILY. 08/20/19   Marinus Mawaylor, Gregg W, MD  PROAIR HFA 108 [phone](90 Base) MCG/ACT inhaler Inhale 2 puffs into the lungs every 6 (six) hours as needed for shortness of breath. 06/08/16   [provider]    Allergies    Azithromycin, Codeine, Ivp dye [iodinated diagnostic agents], Natamycin, Other, Rose, Shellfish allergy, Thimerosol [thimerosal], Strawberry extract, Adhesive [tape], Avelox [moxifloxacin hcl in nacl], Bacitracin, Cinnamon, Ginger, Neomycin, Onion, Sunscreens [aminobenzoate], Watermelon [citrullus vulgaris], Benzalkonium chloride, Cephalosporins, and Penicillins  Review of Systems   Review of Systems  Constitutional: Negative for activity change, chills and fever.  HENT: Negative for ear pain and sore throat.   Eyes: Positive for pain. Negative for redness and visual disturbance.  Respiratory: Positive for shortness of breath. Negative for cough.   Cardiovascular: Positive for chest pain. Negative for palpitations.  Gastrointestinal: Negative for abdominal pain, diarrhea, nausea and vomiting.  Genitourinary: Negative for dysuria and hematuria.  Musculoskeletal: Negative for arthralgias, back pain, neck pain and neck stiffness.  Skin: Negative for color change and rash.  Neurological: Positive for dizziness and headaches. Negative for seizures, syncope and weakness.  Psychiatric/Behavioral: Negative for confusion.  All other systems  reviewed and are negative.   Physical Exam Updated Vital Signs BP 133/73   Pulse 74   Temp 98.4 F (36.9 C) (Oral)   Resp 19   Ht 5' 5.5" (1.664 m)   Wt 81.6 kg   SpO2 100%   BMI 29.50 kg/m   Physical Exam Vitals and nursing note reviewed.  Constitutional:      General: She is not in acute distress.    Appearance: She is well-developed. She is not ill-appearing, toxic-appearing or diaphoretic.  HENT:     Head: Normocephalic and atraumatic.     Nose: Nose normal.     Mouth/Throat:     Mouth: Mucous membranes are moist.     Pharynx: Oropharynx is clear.  Eyes:     Conjunctiva/sclera: Conjunctivae normal.     Pupils: Pupils are equal, round, and reactive to light.  Cardiovascular:     Rate and Rhythm: Normal rate and regular rhythm.     Pulses: Normal pulses.     Heart sounds:  Normal heart sounds. No murmur.  Pulmonary:     Effort: Pulmonary effort is normal. No respiratory distress.     Breath sounds: Normal breath sounds.  Abdominal:     General: Abdomen is flat.     Palpations: Abdomen is soft.     Tenderness: There is no abdominal tenderness.  Musculoskeletal:        General: No swelling, tenderness, deformity or signs of injury. Normal range of motion.     Cervical back: Normal range of motion and neck supple.     Right lower leg: No edema.     Left lower leg: No edema.     Comments: No evidence of erythema, cellulitis, unilateral leg swelling.  2+ DP pulses bilaterally.  Full range of motion, sensations and strength in upper and lower extremities.  Skin:    General: Skin is warm and dry.     Capillary Refill: Capillary refill takes less than 2 seconds.  Neurological:     General: No focal deficit present.     Mental Status: She is alert and oriented to person, place, and time.     Cranial Nerves: No cranial nerve deficit.     Sensory: No sensory deficit.     Motor: No weakness.     Coordination: Coordination normal.     Gait: Gait abnormal.     Comments:  Mental Status:  Alert, thought content appropriate, able to give a coherent history. Speech fluent without evidence of aphasia. Able to follow 2 step commands without difficulty.  Cranial Nerves:  II: Peripheral visual fields grossly normal, pupils equal, round, reactive to light III,IV, VI: ptosis not present, extra-ocular motions intact bilaterally  V,VII: smile symmetric, facial light touch sensation equal VIII: hearing grossly normal to voice  X: uvula elevates symmetrically  XI: bilateral shoulder shrug symmetric and strong XII: midline tongue extension without fassiculations Motor:  Normal tone. 5/5 strength of BUE and BLE major muscle groups including strong and equal grip strength and dorsiflexion/plantar flexion Sensory: light touch normal in all extremities. Cerebellar: normal finger-to-nose with bilateral upper extremities, Romberg sign absent Gait: not accessed  Psychiatric:        Mood and Affect: Mood normal.        Behavior: Behavior normal.     ED Results / Procedures / Treatments   Labs (all labs ordered are listed, but only abnormal results are displayed) Labs Reviewed  BASIC METABOLIC PANEL - Abnormal; Notable for the following components:      Result Value   Glucose, Bld 112 (*)    Calcium 8.7 (*)    All other components within normal limits  CBC  CBG MONITORING, ED    EKG EKG Interpretation  Date/Time:  Friday Aug 30 2019 12:13:10 EDT Ventricular Rate:  83 PR Interval:  136 QRS Duration: 78 QT Interval:  364 QTC Calculation: 427 R Axis:   18 Text Interpretation: Normal sinus rhythm Nonspecific ST abnormality Abnormal ECG Confirmed by Virgina Norfolk [phone] on 08/30/2019 5:49:57 PM   Radiology VAS [place] LOWER EXTREMITY VENOUS (DVT) (ONLY [facility] & [facility])  Result Date: 08/30/2019  Lower Venous DVTStudy Indications: Pain, and chest pain.  Comparison Study: No prior study Performing Technologist: Gertie Fey MHA, RDMS, RVT, RDCS  Examination Guidelines:  A complete evaluation includes B-mode imaging, spectral Doppler, color Doppler, and power Doppler as needed of all accessible portions of each vessel. Bilateral testing is considered an integral part of a complete examination. Limited examinations for reoccurring indications may be  performed as noted. The reflux portion of the exam is performed with the patient in reverse Trendelenburg.  +---------+---------------+---------+-----------+----------+--------------+ RIGHT    CompressibilityPhasicitySpontaneityPropertiesThrombus Aging +---------+---------------+---------+-----------+----------+--------------+ CFV      Full           Yes      Yes                                 +---------+---------------+---------+-----------+----------+--------------+ SFJ      Full                                                        +---------+---------------+---------+-----------+----------+--------------+ FV Prox  Full                                                        +---------+---------------+---------+-----------+----------+--------------+ FV Mid   Full                                                        +---------+---------------+---------+-----------+----------+--------------+ FV DistalFull                                                        +---------+---------------+---------+-----------+----------+--------------+ PFV      Full                                                        +---------+---------------+---------+-----------+----------+--------------+ POP      Full           Yes      Yes                                 +---------+---------------+---------+-----------+----------+--------------+ PTV      Full                                                        +---------+---------------+---------+-----------+----------+--------------+ PERO     Full                                                         +---------+---------------+---------+-----------+----------+--------------+ Soleal   Full                                                        +---------+---------------+---------+-----------+----------+--------------+   +----+---------------+---------+-----------+----------+--------------+  LEFTCompressibilityPhasicitySpontaneityPropertiesThrombus Aging +----+---------------+---------+-----------+----------+--------------+ CFV Full           Yes      Yes                                 +----+---------------+---------+-----------+----------+--------------+     Summary: RIGHT: - There is no evidence of deep vein thrombosis in the lower extremity.  - No cystic structure found in the popliteal fossa.  LEFT: - No evidence of common femoral vein obstruction.  *See table(s) above for measurements and observations. Electronically signed by Lemar Livings MD on 08/30/2019 at 3:01:07 PM.    Final     Procedures Procedures (including critical care time)  Medications Ordered in ED Medications  sodium chloride flush (NS) 0.9 % injection 3 mL (has no administration in time range)  acetaminophen (TYLENOL) tablet 650 mg (650 mg Oral Given 08/30/19 1809)    ED Course  I have reviewed the triage vital signs and the nursing notes.  Pertinent labs & imaging results that were available during my care of the patient were reviewed by me and considered in my medical decision making (see chart for details).    MDM Rules/Calculators/A&P                      69 year old female with headache, right leg pain at 1:30 AM this morning. On presentation to the ER, patient is alert and oriented, in no acute distress, without increased work of breathing, nontoxic appearing. Vitals overall reassuring, she is not tachycardic, sats 100% and respirations 19. Physical exam without acute neurologic abnormalities, no evidence of unilateral leg swelling, overlying cellulitis, negative Homans, 2+ DP pulses. Doubt PAD,  ischemic leg. Right calf pain highly consistent with muscle cramp. She states that she continues to have a headache, which is slightly worse than her normal baseline. She did say that she took Tylenol early this morning around 1:30 AM and her headache did improve, she has not taken any since she has been in the ER for most of the day. Low concern for stroke, intracranial bleed. No head trauma, not on anticoagulation. Patient treated with Tylenol in the ER, headache improved.  DVT study negative for clot. Electrolytes unremarkable, CBC without leukocytosis. EKG unchanged from previous. Patient is overall reassured by the work-up. Encouraged follow-up with PCP. Patient voices understanding is agreeable to this plan. At this stage in the ED course, the patient been adequately screened and is stable for discharge.  Patient was seen and evaluated by Dr. Lockie Mola who agrees with the above plan.  Final Clinical Impression(s) / ED Diagnoses Final diagnoses:  Right leg pain  Migraine without status migrainosus, not intractable, unspecified migraine type    Rx / DC Orders ED Discharge Orders    None       Leone Brand 08/30/19 1853    Virgina Norfolk, DO 08/30/19 2008    Virgina Norfolk, DO 08/30/19 2009

## 2019-09-04 ENCOUNTER — Ambulatory Visit (INDEPENDENT_AMBULATORY_CARE_PROVIDER_SITE_OTHER): Payer: Medicare Other | Admitting: *Deleted

## 2019-09-04 DIAGNOSIS — I639 Cerebral infarction, unspecified: Secondary | ICD-10-CM

## 2019-09-04 LAB — CUP PACEART REMOTE DEVICE CHECK
Date Time Interrogation Session: 20210526014714
Implantable Pulse Generator Implant Date: 20190129

## 2019-09-05 NOTE — Progress Notes (Signed)
[facility] Summary Report / Loop Recorder 

## 2019-10-07 ENCOUNTER — Ambulatory Visit (INDEPENDENT_AMBULATORY_CARE_PROVIDER_SITE_OTHER): Payer: Medicare Other | Admitting: *Deleted

## 2019-10-07 DIAGNOSIS — I639 Cerebral infarction, unspecified: Secondary | ICD-10-CM | POA: Diagnosis not present

## 2019-10-07 LAB — CUP PACEART REMOTE DEVICE CHECK
Date Time Interrogation Session: 20210628025400
Implantable Pulse Generator Implant Date: 20190129

## 2019-10-08 NOTE — Progress Notes (Signed)
[facility] Summary Report / Loop Recorder 

## 2019-11-11 ENCOUNTER — Ambulatory Visit (INDEPENDENT_AMBULATORY_CARE_PROVIDER_SITE_OTHER): Payer: Medicare Other | Admitting: *Deleted

## 2019-11-11 DIAGNOSIS — R55 Syncope and collapse: Secondary | ICD-10-CM

## 2019-11-11 LAB — CUP PACEART REMOTE DEVICE CHECK
Date Time Interrogation Session: 20210731030453
Implantable Pulse Generator Implant Date: 20190129

## 2019-11-12 ENCOUNTER — Telehealth: Payer: Self-pay | Admitting: Internal Medicine

## 2019-11-12 NOTE — Telephone Encounter (Signed)
Pt c/o of Chest Pain: STAT if CP now or developed within 24 hours  1. Are you having CP right now? No   2. Are you experiencing any other symptoms (ex. SOB, nausea, vomiting, sweating)? Dizziness and nausea  3. How long have you been experiencing CP? Last night around midnight  4. Is your CP continuous or coming and going? Continuous - very irregular, and hard and fast.    5. Have you taken Nitroglycerin? No   ? Patient states she had a episode during the night with her loop recorder that woke her out of a deep sleep. Her heart was pounding and had a little chest pain - it wasn't sharp, but just uncomfortable. She states she had a lot of dizziness and nausea yesterday - also had same the symptoms during the episode. It lasted 5-10 mins maybe, she said she tried breathing exercises to get it to calm down but it was harder and faster than normal.

## 2019-11-12 NOTE — Telephone Encounter (Signed)
The patient reports she was woken up at midnight last night with significant heart pounding "faster than ever." She also had some chest discomfort during the episode.  Yesterday, she had "waves of dizziness" as well. She has no symptoms now but is very concerned about her heart rhythm last night. She sent in a remote and will await a call from the [facility] with results/recommendations.

## 2019-11-12 NOTE — Telephone Encounter (Signed)
Attempted to call patient. No answer. LMOVM with direct number provided.  Transmission reviewed and no findings noted.

## 2019-11-13 NOTE — Progress Notes (Signed)
[facility] Summary Report / Loop Recorder 

## 2019-11-13 NOTE — Telephone Encounter (Signed)
Attempted to call patient back, call went straight to voicemail twice.  Left message advising patient to callback.

## 2019-11-13 NOTE — Telephone Encounter (Signed)
The pt states no one called her back about the findings on the loop. She states her heart was pounding and she felt like she was having a tach episode. I let her know that the nurse tried to call her twice and the phone went straight to voicemail. I told her per then nurse phone note there was no findings on the transmission. She states it is no way possible for there to be nothing on there. She states she feel like she wasted her money getting the loop in. I told her I will have the nurse call her back. She states you are the device person you are going to explain it to me. I said ma'am I am the medical assistant that helps the nurses. I do not read the transmission. I will have the nurse call her back and she can tell them the symptoms. They could look at the transmission closer to see if anything is on there. I told her to make sure she answer the phone and I verified the phone number [phone].

## 2019-11-14 NOTE — Telephone Encounter (Signed)
Attempted to return patients phone call. Phone went straight to voicemail. LMOVM provided direct phone number [phone].

## 2019-11-19 NOTE — Telephone Encounter (Signed)
Attempted to reach pt.  Call continues to go straight to VM.  Noted that mychart message sent 8/6 has been read by patient.

## 2019-12-15 LAB — CUP PACEART REMOTE DEVICE CHECK
Date Time Interrogation Session: 20210902030631
Implantable Pulse Generator Implant Date: 20190129

## 2019-12-17 ENCOUNTER — Ambulatory Visit (INDEPENDENT_AMBULATORY_CARE_PROVIDER_SITE_OTHER): Payer: Medicare Other | Admitting: *Deleted

## 2019-12-17 DIAGNOSIS — I639 Cerebral infarction, unspecified: Secondary | ICD-10-CM | POA: Diagnosis not present

## 2019-12-18 NOTE — Progress Notes (Signed)
[facility] Summary Report / Loop Recorder 

## 2020-01-05 ENCOUNTER — Other Ambulatory Visit: Payer: Self-pay | Admitting: Internal Medicine

## 2020-01-16 LAB — CUP PACEART REMOTE DEVICE CHECK
Date Time Interrogation Session: 20211005030910
Implantable Pulse Generator Implant Date: 20190129

## 2020-01-20 ENCOUNTER — Ambulatory Visit (INDEPENDENT_AMBULATORY_CARE_PROVIDER_SITE_OTHER): Payer: Medicare Other

## 2020-01-20 DIAGNOSIS — I639 Cerebral infarction, unspecified: Secondary | ICD-10-CM

## 2020-01-22 NOTE — Progress Notes (Signed)
[facility] Summary Report / Loop Recorder 

## 2020-01-24 ENCOUNTER — Other Ambulatory Visit: Payer: Self-pay

## 2020-01-24 ENCOUNTER — Other Ambulatory Visit: Payer: Medicare Other

## 2020-01-24 DIAGNOSIS — Z20822 Contact with and (suspected) exposure to covid-19: Secondary | ICD-10-CM

## 2020-01-25 LAB — SARS-COV-2, NAA 2 DAY TAT

## 2020-01-25 LAB — SPECIMEN STATUS REPORT

## 2020-01-25 LAB — NOVEL CORONAVIRUS, NAA: SARS-CoV-2, NAA: NOT DETECTED

## 2020-02-16 LAB — CUP PACEART REMOTE DEVICE CHECK
Date Time Interrogation Session: 20211107022857
Implantable Pulse Generator Implant Date: 20190129

## 2020-02-20 ENCOUNTER — Ambulatory Visit: Payer: Medicare Other

## 2020-02-24 ENCOUNTER — Ambulatory Visit (INDEPENDENT_AMBULATORY_CARE_PROVIDER_SITE_OTHER): Payer: Medicare Other

## 2020-02-24 DIAGNOSIS — I639 Cerebral infarction, unspecified: Secondary | ICD-10-CM | POA: Diagnosis not present

## 2020-02-25 NOTE — Progress Notes (Signed)
[facility] Summary Report / Loop Recorder 

## 2020-03-27 ENCOUNTER — Telehealth: Payer: Self-pay | Admitting: Internal Medicine

## 2020-03-27 NOTE — Telephone Encounter (Signed)
Remote transmission received and symptom episodes assessed as SR with occasional PVC.Marland Kitchen Patient reports she has had intermittent episodes of dizziness and felt like she may "pass out". She reports  Blurred vision at times of the episode and felt like her " heart may be out of rhythm"and her legs would become weak.. She reports she would lie down and elevate her legs during the episodes with no change in s/sx.  Patient reports no CP, SOB, or chest pressure during the episodes. She did not take her BP during or after the episodes of near syncope. Advised patient to contact PCP regarding dizziness and near syncopal episodes. ED precautions given.

## 2020-03-27 NOTE — Telephone Encounter (Signed)
The pt states she has not felt well all week. She been feeling dizzy. She did not pass completely out. She just felt dizzy for 3 days and feels like she going to pass out. Her legs feels like jelly. She do not have any cold or Covid symptoms.

## 2020-03-27 NOTE — Telephone Encounter (Signed)
Pt c/o Syncope: STAT if syncope occurred within 30 minutes and pt complains of lightheadedness High Priority if episode of passing out, completely, today or in last 24 hours   1. Did you pass out today? No, patient says she feels like she's going to several times during the week.  2. When is the last time you passed out? Hasnt fully passes out just feels like she is going to.  3. Has this occurred multiple times? Yes.  4. Did you have any symptoms prior to passing out?  Vision in and out, noticing that she's having irregular heartbeats, feeling heavy and tired,      Patient states she's pressed the symptom activator on her loop recorder several times when this occurs and is requesting an appt today in regards to this.

## 2020-03-27 NOTE — Telephone Encounter (Signed)
LMOM to call.  Need manual transmission to assess symptoms.

## 2020-03-30 ENCOUNTER — Ambulatory Visit (INDEPENDENT_AMBULATORY_CARE_PROVIDER_SITE_OTHER): Payer: Medicare Other

## 2020-03-30 DIAGNOSIS — I639 Cerebral infarction, unspecified: Secondary | ICD-10-CM

## 2020-03-30 LAB — CUP PACEART REMOTE DEVICE CHECK
Date Time Interrogation Session: 20211218232814
Implantable Pulse Generator Implant Date: 20190129

## 2020-04-05 ENCOUNTER — Emergency Department (HOSPITAL_COMMUNITY)
Admission: EM | Admit: 2020-04-05 | Discharge: 2020-04-05 | Disposition: A | Discharge status: Home / Self Care | Payer: Medicare Other | Attending: Emergency Medicine | Admitting: Emergency Medicine

## 2020-04-05 ENCOUNTER — Emergency Department (HOSPITAL_COMMUNITY): Payer: Medicare Other

## 2020-04-05 ENCOUNTER — Other Ambulatory Visit: Payer: Self-pay

## 2020-04-05 DIAGNOSIS — Z79899 Other long term (current) drug therapy: Secondary | ICD-10-CM | POA: Diagnosis not present

## 2020-04-05 DIAGNOSIS — E039 Hypothyroidism, unspecified: Secondary | ICD-10-CM | POA: Diagnosis not present

## 2020-04-05 DIAGNOSIS — R600 Localized edema: Secondary | ICD-10-CM | POA: Diagnosis not present

## 2020-04-05 DIAGNOSIS — Z95818 Presence of other cardiac implants and grafts: Secondary | ICD-10-CM | POA: Diagnosis not present

## 2020-04-05 DIAGNOSIS — Z8673 Personal history of transient ischemic attack (TIA), and cerebral infarction without residual deficits: Secondary | ICD-10-CM | POA: Diagnosis not present

## 2020-04-05 DIAGNOSIS — R202 Paresthesia of skin: Secondary | ICD-10-CM | POA: Insufficient documentation

## 2020-04-05 DIAGNOSIS — I10 Essential (primary) hypertension: Secondary | ICD-10-CM | POA: Insufficient documentation

## 2020-04-05 LAB — CBC
HCT: 41.4 % (ref 36.0–46.0)
Hemoglobin: 13.5 g/dL (ref 12.0–15.0)
MCH: 30.3 pg (ref 26.0–34.0)
MCHC: 32.6 g/dL (ref 30.0–36.0)
MCV: 92.8 fL (ref 80.0–100.0)
Platelets: 257 10*3/uL (ref 150–400)
RBC: 4.46 MIL/uL (ref 3.87–5.11)
RDW: 13.3 % (ref 11.5–15.5)
WBC: 6.5 10*3/uL (ref 4.0–10.5)
nRBC: 0 % (ref 0.0–0.2)

## 2020-04-05 LAB — DIFFERENTIAL
Abs Immature Granulocytes: 0.01 10*3/uL (ref 0.00–0.07)
Basophils Absolute: 0.1 10*3/uL (ref 0.0–0.1)
Basophils Relative: 1 %
Eosinophils Absolute: 0.4 10*3/uL (ref 0.0–0.5)
Eosinophils Relative: 6 %
Immature Granulocytes: 0 %
Lymphocytes Relative: 30 %
Lymphs Abs: 1.9 10*3/uL (ref 0.7–4.0)
Monocytes Absolute: 0.5 10*3/uL (ref 0.1–1.0)
Monocytes Relative: 7 %
Neutro Abs: 3.7 10*3/uL (ref 1.7–7.7)
Neutrophils Relative %: 56 %

## 2020-04-05 LAB — I-STAT CHEM 8, ED
BUN: 14 mg/dL (ref 8–23)
Calcium, Ion: 1.19 mmol/L (ref 1.15–1.40)
Chloride: 105 mmol/L (ref 98–111)
Creatinine, Ser: 1.1 mg/dL — ABNORMAL HIGH (ref 0.44–1.00)
Glucose, Bld: 100 mg/dL — ABNORMAL HIGH (ref 70–99)
HCT: 42 % (ref 36.0–46.0)
Hemoglobin: 14.3 g/dL (ref 12.0–15.0)
Potassium: 4.4 mmol/L (ref 3.5–5.1)
Sodium: 138 mmol/L (ref 135–145)
TCO2: 22 mmol/L (ref 22–32)

## 2020-04-05 LAB — COMPREHENSIVE METABOLIC PANEL
ALT: 20 U/L (ref 0–44)
AST: 20 U/L (ref 15–41)
Albumin: 4 g/dL (ref 3.5–5.0)
Alkaline Phosphatase: 61 U/L (ref 38–126)
Anion gap: 10 (ref 5–15)
BUN: 12 mg/dL (ref 8–23)
CO2: 22 mmol/L (ref 22–32)
Calcium: 9.1 mg/dL (ref 8.9–10.3)
Chloride: 105 mmol/L (ref 98–111)
Creatinine, Ser: 1.11 mg/dL — ABNORMAL HIGH (ref 0.44–1.00)
GFR, Estimated: 54 mL/min — ABNORMAL LOW (ref >60)
Glucose, Bld: 108 mg/dL — ABNORMAL HIGH (ref 70–99)
Potassium: 4.4 mmol/L (ref 3.5–5.1)
Sodium: 137 mmol/L (ref 135–145)
Total Bilirubin: 0.3 mg/dL (ref 0.3–1.2)
Total Protein: 7.5 g/dL (ref 6.5–8.1)

## 2020-04-05 LAB — PROTIME-INR
INR: 1 (ref 0.8–1.2)
Prothrombin Time: 12.4 seconds (ref 11.4–15.2)

## 2020-04-05 LAB — APTT: aPTT: 27 seconds (ref 24–36)

## 2020-04-05 MED ORDER — SODIUM CHLORIDE 0.9% FLUSH
3.0000 mL | Freq: Once | INTRAVENOUS | Status: AC
  Administered 2020-04-05: 3 mL via INTRAVENOUS

## 2020-04-05 NOTE — ED Notes (Signed)
Patient transported to MRI 

## 2020-04-05 NOTE — Discharge Instructions (Addendum)
Please continue to take your medications and follow up with your primary care doctor.

## 2020-04-05 NOTE — ED Provider Notes (Signed)
[facility] Provider Note   CSN: [id number] Arrival date & time: 04/05/20  1050     History No chief complaint on file.   Heather Carey is a 69 y.o. female.  Who presents today for evaluation of right-sided facial paresthesias.  She first noticed these this morning when she woke up.  4 days ago she saw her ophthalmologist and started getting treated with Bactrim for a infected stye.  She states that the swelling and symptoms from that have all significantly improved.  She denies any change in her vision.  No fevers.  No facial weakness or weakness in bilateral arms or legs.  No fevers.  She did not have any nausea or vomiting.  She denies any known sick contacts.    HPI     Past Medical History:  Diagnosis Date  . Diverticulitis   . Eosinophilic esophagitis   . Fibromyalgia   . GERD (gastroesophageal reflux disease)   . Hyperlipidemia   . Hypertension   . Hypothyroidism   . IBS (irritable bowel syndrome)   . Migraines   . Mitral regurgitation 02/28/2014   Mild to moderate by echo 02/2014  . Obesity   . Osteoarthritis   . Otitis externa   . Palpitations   . TIA (transient ischemic attack)     Patient Active Problem List   Diagnosis Date Noted  . Malnutrition of moderate degree 03/09/2016  . Syncope 03/08/2016  . Sensory disturbance 03/08/2016  . Hypothyroidism 03/08/2016  . Urticaria with associated angioedema 04/27/2015  . Angioedema 04/27/2015  . Allergic reaction 01/07/2015  . Food allergy 01/07/2015  . Mitral regurgitation 02/28/2014  . MVP (mitral valve prolapse) 02/24/2014  . SOB (shortness of breath) 02/24/2014  . Snoring 02/24/2014  . Hypertension   . Hyperlipidemia   . Chest pain 02/10/2014    Past Surgical History:  Procedure Laterality Date  . ABDOMINAL HYSTERECTOMY    . carpel tunnel    . lbp gso    . LOOP RECORDER INSERTION N/A 05/08/2017   Procedure: LOOP RECORDER INSERTION;  Surgeon: Marinus Mawaylor, Gregg W,  MD;  Location: [facility] INVASIVE CV LAB;  Service: Cardiovascular;  Laterality: N/A;  . TONSILLECTOMY AND ADENOIDECTOMY       OB History   No obstetric history on file.     Family History  Problem Relation Age of Onset  . Allergic rhinitis Mother   . Urticaria Mother   . Allergic rhinitis Father   . Asthma Father   . Allergic rhinitis Sister   . Angioedema Sister   . Urticaria Sister   . Allergic rhinitis Brother   . Asthma Brother     Social History   Tobacco Use  . Smoking status: Never Smoker  . Smokeless tobacco: Never Used  Vaping Use  . Vaping Use: Never used  Substance Use Topics  . Alcohol use: Yes    Alcohol/week: 1.0 standard drink    Types: 1 Glasses of wine per week  . Drug use: No    Home Medications Prior to Admission medications   Medication Sig Start Date End Date Taking? Authorizing Provider  acetaminophen (TYLENOL) 500 MG tablet Take 1,000 mg by mouth 2 (two) times daily as needed (migraine headaches).   Yes [provider]  diphenhydrAMINE (BENADRYL) 12.5 MG chewable tablet Chew 25 mg by mouth 2 (two) times daily as needed (allergic reaction).   Yes [provider]  diphenhydrAMINE (BENADRYL) 2 % cream Apply 1 application topically daily as  needed for itching (allergic reactions).   Yes [provider]  EPINEPHrine 0.3 mg/0.3 mL IJ SOAJ injection Inject 0.3 mLs (0.3 mg total) into the muscle once. Patient taking differently: Inject 0.3 mg into the muscle once as needed for anaphylaxis (severe allergic reaction). 04/27/15  Yes Bobbitt, Heywood Iles, MD  Eyelid Cleansers [facility] STERILID CLEANSER) SOLN Place 1 application into both eyes 2 (two) times daily as needed (to cleanse eyes).   Yes [provider]  levothyroxine (SYNTHROID) 88 MCG tablet Take 88 mcg by mouth daily before breakfast.   Yes [provider]  metoprolol succinate (TOPROL-XL) 25 MG 24 hr tablet TAKE 1/2 TABLET (12.5 MG TOTAL) BY MOUTH 2 (TWO)  TIMES DAILY. Patient taking differently: Take 12.5 mg by mouth daily with supper. 01/06/20  Yes Marinus Maw, MD  sulfamethoxazole-trimethoprim (BACTRIM DS) 800-160 MG tablet Take 1 tablet by mouth 2 (two) times daily. 04/02/20  Yes [provider]  vitamin B-12 (CYANOCOBALAMIN) 1000 MCG tablet Take 1,000 mcg by mouth daily.   Yes [provider]    Allergies    Azithromycin, Codeine, Ivp dye [iodinated diagnostic agents], Natamycin, Other, Rose, Shellfish allergy, Thimerosol [thimerosal], Strawberry extract, Adhesive [tape], Avelox [moxifloxacin hcl in nacl], Bacitracin, Cinnamon, Doxycycline, Ginger, Neomycin, Onion, Sunscreens [aminobenzoate], Watermelon [citrullus vulgaris], Benzalkonium chloride, Cephalosporins, and Penicillins  Review of Systems   Review of Systems  Constitutional: Negative for chills, fatigue and fever.  HENT: Negative for congestion, ear pain, facial swelling, sinus pressure, sinus pain and sore throat.   Eyes: Negative for redness and visual disturbance.  Respiratory: Negative for cough and shortness of breath.   Neurological: Positive for numbness (Subjective decreased sensationonly on right side of face, more pronounced by maxilla). Negative for facial asymmetry, speech difficulty, weakness and headaches.  All other systems reviewed and are negative.   Physical Exam Updated Vital Signs BP 108/62 (BP Location: Right Arm)   Pulse 67   Temp 98.4 F (36.9 C) (Oral)   Resp (!) 22   SpO2 98%   Physical Exam Vitals and nursing note reviewed.  Constitutional:      General: She is not in acute distress.    Appearance: She is not diaphoretic.  HENT:     Head: Normocephalic and atraumatic.     Right Ear: External ear normal.     Left Ear: Tympanic membrane, ear canal and external ear normal.     Ears:     Comments: Right sided ear canal is significiantly narrowed, no pain with ear motion.  No obvious otitis externa.  Patient states that this  is normal for her by what her audiologist has told her in the past.  Eyes:     General: No scleral icterus.       Right eye: No discharge.        Left eye: No discharge.     Conjunctiva/sclera: Conjunctivae normal.  Cardiovascular:     Rate and Rhythm: Normal rate and regular rhythm.     Pulses: Normal pulses.     Heart sounds: Normal heart sounds.  Pulmonary:     Effort: Pulmonary effort is normal. No respiratory distress.     Breath sounds: No stridor.  Abdominal:     General: There is no distension.  Musculoskeletal:        General: No deformity.     Cervical back: Normal range of motion and neck supple. No tenderness.  Lymphadenopathy:     Cervical: No cervical adenopathy.  Skin:  General: Skin is warm and dry.  Neurological:     Mental Status: She is alert.     Motor: No abnormal muscle tone.     Comments: Mental Status:  Alert, oriented, thought content appropriate, able to give a coherent history. Speech fluent without evidence of aphasia. Able to follow 2 step commands without difficulty.  Cranial Nerves:  II:  Peripheral visual fields grossly normal, pupils equal, round, reactive to light III,IV, VI: ptosis not present, extra-ocular motions intact bilaterally  V,VII: smile symmetric, subjective decreased sensation to light touch over entire right side of face, more pronounced over the maxilla however does include all V nerve distributions.  Normal sensation over right sided posterior scalp  And neck.  VIII: hearing grossly normal to voice  X: uvula elevates symmetrically  XI: bilateral shoulder shrug symmetric and strong XII: midline tongue extension without fassiculations Motor:  Normal tone. 5/5 in upper and lower extremities bilaterally including strong and equal grip strength and dorsiflexion/plantar flexion   Psychiatric:        Behavior: Behavior normal.     ED Results / Procedures / Treatments   Labs (all labs ordered are listed, but only abnormal  results are displayed) Labs Reviewed  COMPREHENSIVE METABOLIC PANEL - Abnormal; Notable for the following components:      Result Value   Glucose, Bld 108 (*)    Creatinine, Ser 1.11 (*)    GFR, Estimated 54 (*)    All other components within normal limits  I-STAT CHEM 8, ED - Abnormal; Notable for the following components:   Creatinine, Ser 1.10 (*)    Glucose, Bld 100 (*)    All other components within normal limits  PROTIME-INR  APTT  CBC  DIFFERENTIAL  CBG MONITORING, ED    EKG EKG Interpretation  Date/Time:  Sunday April 05 2020 12:01:27 EST Ventricular Rate:  89 PR Interval:  128 QRS Duration: 76 QT Interval:  346 QTC Calculation: 420 R Axis:   -8 Text Interpretation: Normal sinus rhythm Nonspecific ST abnormality Abnormal ECG No significant change since prior 5/21 Confirmed by Meridee Score [phone]) on 04/05/2020 4:25:02 PM   Radiology CT HEAD WO CONTRAST  Result Date: 04/05/2020 CLINICAL DATA:  Neurologic deficit EXAM: CT HEAD WITHOUT CONTRAST TECHNIQUE: Contiguous axial images were obtained from the base of the skull through the vertex without intravenous contrast. COMPARISON:  10/15/2017 FINDINGS: Brain: No evidence of acute infarction, hemorrhage, hydrocephalus, extra-axial collection or mass lesion/mass effect. Empty sella is again noted, unchanged in appearance from prior. Vascular: Atherosclerotic calcifications involving the large vessels of the skull base. No unexpected hyperdense vessel. Skull: Normal. Negative for fracture or focal lesion. Sinuses/Orbits: No acute finding. Other: None. IMPRESSION: No acute intracranial findings. Electronically Signed   By: Duanne Guess D.O.   On: 04/05/2020 12:46   MR BRAIN WO CONTRAST  Result Date: 04/05/2020 CLINICAL DATA:  Right facial paresthesia. EXAM: MRI HEAD WITHOUT CONTRAST TECHNIQUE: Multiplanar, multiecho pulse sequences of the brain and surrounding structures were obtained without intravenous contrast.  COMPARISON:  Head CT 04/05/2020 and MRI 03/06/2016 FINDINGS: Brain: There is no evidence of an acute infarct, intracranial hemorrhage, mass, midline shift, or extra-axial fluid collection. Small T2 hyperintensities in the cerebral white matter bilaterally have progressed from the prior MRI and are nonspecific but compatible with mild chronic small vessel ischemic disease. The ventricles and sulci are normal. A moderately enlarged partially empty sella is unchanged. Vascular: Major intracranial vascular flow voids are preserved. Skull and upper cervical spine:  Unremarkable bone marrow signal. Sinuses/Orbits: Unremarkable orbits. Clear paranasal sinuses. Trace bilateral mastoid fluid. Other: None. IMPRESSION: 1. No acute intracranial abnormality. 2. Mild chronic small vessel ischemic disease. Electronically Signed   By: Sebastian Ache M.D.   On: 04/05/2020 20:56    Procedures Procedures (including critical care time)  Medications Ordered in ED Medications  sodium chloride flush (NS) 0.9 % injection 3 mL (3 mLs Intravenous Given 04/05/20 1719)    ED Course  I have reviewed the triage vital signs and the nursing notes.  Pertinent labs & imaging results that were available during my care of the patient were reviewed by me and considered in my medical decision making (see chart for details).  Clinical Course as of 04/06/20 0001  Sun Apr 05, 2020  [id number] 69 year old female with right-sided facial numbness mostly in the cheek although some on the forehead.  She recently had some sort of stye that led to some cellulitis around her eye.  Ophthalmology put her on Bactrim and that seems to be improving.  She woke up with this numbness today.  No vision changes.  No hearing changes.  CT imaging does not show anything obvious as far as brain. [MB]    Clinical Course User Index [MB] Terrilee Files, MD   MDM Rules/Calculators/A&P                         Patient is a 69 year old woman who presents today for  evaluation of paresthesias on the right side of her face since waking up this morning.  This is in the setting of recent stye causing swelling.  Here she appears to be neurovascularly intact aside from subjective decreased sensation to light touch over the entire right side of her face.  CT head was obtained without acute abnormalities.  CBC, CMP, PT INR were all obtained without cause for her symptoms found.  MRI brain was obtained after informal discussion with neurology without evidence of occult stroke or other cause of her symptoms.    Her right ear canal is significantly narrowed however patient reports that that is her baseline according to what her audiologist has had before.  If she had extension of the stye causing deeper infection I expect that this would have been seen on brain MRI or even CT scan of the head.  She does not have leukocytosis which is additionally reassuring.  She has recently been started on vitamin B12 supplementation and it is possible that low B12 may be playing a role.  No emergent cause for her symptoms found. Recommended outpatient follow-up.  Return precautions were discussed with patient who states their understanding.  At the time of discharge patient denied any unaddressed complaints or concerns.  Patient is agreeable for discharge home.  Note: Portions of this report may have been transcribed using voice recognition software. Every effort was made to ensure accuracy; however, inadvertent computerized transcription errors may be present   Final Clinical Impression(s) / ED Diagnoses Final diagnoses:  Paresthesia    Rx / DC Orders ED Discharge Orders    None       Cristina Gong, PA-C 04/06/20 0001    Terrilee Files, MD 04/06/20 [phone]

## 2020-04-05 NOTE — ED Triage Notes (Signed)
Patient complains of right sided facial numbness since awakening. reportys right jaw pain intermittently x 1 week. Patient with no arm drift and also reports recent treatment with cellulitis of right eye and taking antibiotic

## 2020-04-13 ENCOUNTER — Other Ambulatory Visit: Payer: Medicare Other

## 2020-04-13 NOTE — Progress Notes (Signed)
[facility] Summary Report / Loop Recorder 

## 2020-04-24 DIAGNOSIS — J069 Acute upper respiratory infection, unspecified: Secondary | ICD-10-CM | POA: Diagnosis not present

## 2020-04-24 DIAGNOSIS — Z03818 Encounter for observation for suspected exposure to other biological agents ruled out: Secondary | ICD-10-CM | POA: Diagnosis not present

## 2020-05-01 LAB — CUP PACEART REMOTE DEVICE CHECK
Date Time Interrogation Session: 20220121005525
Implantable Pulse Generator Implant Date: 20190129

## 2020-05-04 ENCOUNTER — Ambulatory Visit (INDEPENDENT_AMBULATORY_CARE_PROVIDER_SITE_OTHER): Payer: Medicare Other

## 2020-05-04 DIAGNOSIS — I639 Cerebral infarction, unspecified: Secondary | ICD-10-CM

## 2020-05-07 ENCOUNTER — Ambulatory Visit: Payer: Medicare Other | Attending: Internal Medicine

## 2020-05-07 DIAGNOSIS — Z23 Encounter for immunization: Secondary | ICD-10-CM

## 2020-05-07 NOTE — Progress Notes (Signed)
   Covid-19 Vaccination Clinic  Name:  Heather Carey    MRN: [id number] DOB: 04-17-1950  05/07/2020  Heather Carey was observed post Covid-19 immunization for 30 minutes based on pre-vaccination screening without incident. She was provided with Vaccine Information Sheet and instruction to access the V-Safe system.   Heather Carey was instructed to call 911 with any severe reactions post vaccine: Marland Kitchen Difficulty breathing  . Swelling of face and throat  . A fast heartbeat  . A bad rash all over body  . Dizziness and weakness   Immunizations Administered    Name Date Dose VIS Date Route   PFIZER Comrnaty(Gray TOP) Covid-19 Vaccine 05/07/2020  4:15 PM 0.3 mL 03/19/2020 Intramuscular   Manufacturer: [employer], [employer]   Lot: [id number]   NDC: [phone]

## 2020-05-15 NOTE — Progress Notes (Signed)
[facility] Summary Report / Loop Recorder 

## 2020-05-25 DIAGNOSIS — J069 Acute upper respiratory infection, unspecified: Secondary | ICD-10-CM | POA: Diagnosis not present

## 2020-05-29 DIAGNOSIS — J0100 Acute maxillary sinusitis, unspecified: Secondary | ICD-10-CM | POA: Diagnosis not present

## 2020-06-03 LAB — CUP PACEART REMOTE DEVICE CHECK
Date Time Interrogation Session: 20220223021104
Implantable Pulse Generator Implant Date: 20190129

## 2020-06-08 ENCOUNTER — Ambulatory Visit (INDEPENDENT_AMBULATORY_CARE_PROVIDER_SITE_OTHER): Payer: Medicare Other

## 2020-06-08 DIAGNOSIS — I639 Cerebral infarction, unspecified: Secondary | ICD-10-CM

## 2020-06-15 NOTE — Progress Notes (Signed)
[facility] Summary Report / Loop Recorder 

## 2020-06-25 ENCOUNTER — Telehealth: Payer: Self-pay | Admitting: Internal Medicine

## 2020-06-25 NOTE — Telephone Encounter (Signed)
Pt c/o Shortness Of Breath: STAT if SOB developed within the last 24 hours or pt is noticeably SOB on the phone  1. Are you currently SOB (can you hear that pt is SOB on the phone)? No   2. How long have you been experiencing SOB? Patient states she was SOB 06/23/20-06/23/20  3. Are you SOB when sitting or when up moving around? Both   4. Are you currently experiencing any other symptoms? Tingling in left arm

## 2020-06-25 NOTE — Telephone Encounter (Signed)
Pt states Sunday, Monday and Tuesday she was having some SOB and Tingling down her arms. She states she used her symptom activator. I asked her to send a manual transmission for the nurse to review. I also asked her was she currently experiencing SOB and tingling down her arm? She states No. She said she felt better yesterday and today. I told her once the nurse review the transmission she will give her a call back.

## 2020-06-26 NOTE — Telephone Encounter (Signed)
Manual transmission received.  ECG suggests ST at time of symptom episode.  Rates 100-130bpm.    Attempted to contact patient to discuss symptoms, medication compliance.  LVM with device clinic # and hours to return call.    Current meds include Metoprolol 12.5mg  BID.

## 2020-07-02 NOTE — Telephone Encounter (Signed)
LMOM to call DC-

## 2020-07-06 ENCOUNTER — Ambulatory Visit (INDEPENDENT_AMBULATORY_CARE_PROVIDER_SITE_OTHER): Payer: Medicare Other

## 2020-07-06 DIAGNOSIS — I639 Cerebral infarction, unspecified: Secondary | ICD-10-CM

## 2020-07-06 LAB — CUP PACEART REMOTE DEVICE CHECK
Date Time Interrogation Session: 20220328031349
Implantable Pulse Generator Implant Date: 20190129

## 2020-07-07 ENCOUNTER — Encounter: Payer: Self-pay | Admitting: Internal Medicine

## 2020-07-07 ENCOUNTER — Other Ambulatory Visit: Payer: Self-pay

## 2020-07-07 ENCOUNTER — Ambulatory Visit: Payer: Medicare Other | Admitting: Internal Medicine

## 2020-07-07 VITALS — BP 132/80 | HR 78 | Ht 65.5 in | Wt 196.0 lb

## 2020-07-07 DIAGNOSIS — R0600 Dyspnea, unspecified: Secondary | ICD-10-CM

## 2020-07-07 LAB — CUP PACEART INCLINIC DEVICE CHECK
Date Time Interrogation Session: 20220329095158
Implantable Pulse Generator Implant Date: 20190129

## 2020-07-07 NOTE — Progress Notes (Signed)
HPI Heather Carey returns today for ongoing evaluation and management of her SVT. She has a remote h/o cryptogenic stroke and is s/p ILR insertion. She has episodes of SVT which she can usually terminate with vagal maneuvers. She has had problems with sob with exertion. She denies dietary indiscretion. No edema. No syncope. Her SVT has been stable and controlled. Allergies  Allergen Reactions  . Azithromycin Anaphylaxis  . Codeine Shortness Of Breath  . Ivp Dye [Iodinated Diagnostic Agents] Anaphylaxis  . Natamycin Other (See Comments)    Throat Swelling  . Other Anaphylaxis, Hives, Swelling and Other (See Comments)    Irregular heart beat Triple Antibiotic Ointment Nuts - Anaphylaxis  Berries - itching and swelling   . Rose Hives  . Shellfish Allergy Anaphylaxis  . Thimerosol [Thimerosal] Anaphylaxis  . Strawberry Extract Swelling    Pt reports throat swelling and itching with any type of berry  . Adhesive [Tape] Hives    Paper tape is ok  . Avelox [Moxifloxacin Hcl In Nacl] Other (See Comments)    Irregular heart beat  . Bacitracin Hives and Itching  . Cinnamon Hives  . Doxycycline Hives and Swelling    Facial swelling  . Ginger Hives and Swelling  . Neomycin Hives  . Onion Swelling    MOUTH SWELLING  . Sunscreens [Aminobenzoate] Hives and Swelling    Avobenzene and Oxybenzene  . Watermelon [Citrullus Vulgaris]     Tongue swelling   . Benzalkonium Chloride Rash  . Cephalosporins Hives and Rash  . Penicillins Other (See Comments)    Childhood allergy Has patient had a PCN reaction causing immediate rash, facial/tongue/throat swelling, SOB or lightheadedness with hypotension: Unknown Has patient had a PCN reaction causing severe rash involving mucus membranes or skin necrosis: Unknown Has patient had a PCN reaction that required hospitalization: No Has patient had a PCN reaction occurring within the last 10 years: No If all of the above answers are "NO", then may  proceed with Cephalosporin use.      Current Outpatient Medications  Medication Sig Dispense Refill  . acetaminophen (TYLENOL) 500 MG tablet Take 1,000 mg by mouth 2 (two) times daily as needed (migraine headaches).    . diphenhydrAMINE (BENADRYL) 12.5 MG chewable tablet Chew 25 mg by mouth 2 (two) times daily as needed (allergic reaction).    . diphenhydrAMINE (BENADRYL) 2 % cream Apply 1 application topically daily as needed for itching (allergic reactions).    . EPINEPHrine 0.3 mg/0.3 mL IJ SOAJ injection Inject 0.3 mLs (0.3 mg total) into the muscle once. (Patient taking differently: Inject 0.3 mg into the muscle once as needed for anaphylaxis (severe allergic reaction).) 2 Device 2  . Eyelid Cleansers (THERATEARS STERILID CLEANSER) SOLN Place 1 application into both eyes 2 (two) times daily as needed (to cleanse eyes).    Marland Kitchen levothyroxine (SYNTHROID) 88 MCG tablet Take 88 mcg by mouth daily before breakfast.    . metoprolol succinate (TOPROL-XL) 25 MG 24 hr tablet Take 12.5 mg by mouth daily.    . vitamin B-12 (CYANOCOBALAMIN) 1000 MCG tablet Take 1,000 mcg by mouth daily.     No current facility-administered medications for this visit.     Past Medical History:  Diagnosis Date  . Diverticulitis   . Eosinophilic esophagitis   . Fibromyalgia   . GERD (gastroesophageal reflux disease)   . Hyperlipidemia   . Hypertension   . Hypothyroidism   . IBS (irritable bowel syndrome)   . Migraines   .  Mitral regurgitation 02/28/2014   Mild to moderate by echo 02/2014  . Obesity   . Osteoarthritis   . Otitis externa   . Palpitations   . TIA (transient ischemic attack)     ROS:   All systems reviewed and negative except as noted in the HPI.   Past Surgical History:  Procedure Laterality Date  . ABDOMINAL HYSTERECTOMY    . carpel tunnel    . lbp gso    . LOOP RECORDER INSERTION N/A 05/08/2017   Procedure: LOOP RECORDER INSERTION;  Surgeon: Marinus Maw, MD;  Location: [facility]  INVASIVE CV LAB;  Service: Cardiovascular;  Laterality: N/A;  . TONSILLECTOMY AND ADENOIDECTOMY       Family History  Problem Relation Age of Onset  . Allergic rhinitis Mother   . Urticaria Mother   . Allergic rhinitis Father   . Asthma Father   . Allergic rhinitis Sister   . Angioedema Sister   . Urticaria Sister   . Allergic rhinitis Brother   . Asthma Brother      Social History   Socioeconomic History  . Marital status: Married    Spouse name: Not on file  . Number of children: Not on file  . Years of education: Not on file  . Highest education level: Not on file  Occupational History  . Not on file  Tobacco Use  . Smoking status: Never Smoker  . Smokeless tobacco: Never Used  Vaping Use  . Vaping Use: Never used  Substance and Sexual Activity  . Alcohol use: Yes    Alcohol/week: 1.0 standard drink    Types: 1 Glasses of wine per week  . Drug use: No  . Sexual activity: Yes  Other Topics Concern  . Not on file  Social History Narrative  . Not on file   Social Determinants of Health   Financial Resource Strain: Not on file  Food Insecurity: Not on file  Transportation Needs: Not on file  Physical Activity: Not on file  Stress: Not on file  Social Connections: Not on file  Intimate Partner Violence: Not on file     BP 132/80   Pulse 78   Ht 5' 5.5" (1.664 m)   Wt 196 lb (88.9 kg)   SpO2 98%   BMI 32.12 kg/m   Physical Exam:  Well appearing35 yo woman, NAD HEENT: Unremarkable Neck:  6 cm JVD, no thyromegally Lymphatics:  No adenopathy Back:  No CVA tenderness Lungs:  Clear with no wheezes HEART:  Regular rate rhythm, no murmurs, no rubs, no clicks Abd:  soft, positive bowel sounds, no organomegally, no rebound, no guarding Ext:  2 plus pulses, no edema, no cyanosis, no clubbing Skin:  No rashes no nodules Neuro:  CN II through XII intact, motor grossly intact   DEVICE  Brief episodes of SVT  Assess/Plan: 1. Dyspnea - this is a new  problem. I suspect that she has diastolic dysfunction but we will have her undergo a 2D echo first. If no insight then a cardiopulmonary stress test and or coronary CT scan would be next. I encouraged her to avoid salty food and to lose weight. 2. Obesity - she has gained 25 lbs in the last year. She is encouraged to lose weight. 3. SVT - she has had this mostly controlled with only a few episodes lasting a few minutes at a time. 4. HTN - her bp is up a bit today and previously was low. We will follow. I  asked her to avoid salty foods.  Sharlot Gowda Maryl Blalock,MD

## 2020-07-07 NOTE — Patient Instructions (Signed)
Medication Instructions:  Your physician recommends that you continue on your current medications as directed. Please refer to the Current Medication list given to you today.  *If you need a refill on your cardiac medications before your next appointment, please call your pharmacy*   Lab Work: NONE   If you have labs (blood work) drawn today and your tests are completely normal, you will receive your results only by: Marland Kitchen MyChart Message (if you have MyChart) OR . A paper copy in the mail If you have any lab test that is abnormal or we need to change your treatment, we will call you to review the results.   Testing/Procedures: Your physician has requested that you have an echocardiogram. Echocardiography is a painless test that uses sound waves to create images of your heart. It provides your doctor with information about the size and shape of your heart and how well your heart's chambers and valves are working. This procedure takes approximately one hour. There are no restrictions for this procedure.   Follow-Up: At [facility], you and your health needs are our priority.  As part of our continuing mission to provide you with exceptional heart care, we have created designated Provider Care Teams.  These Care Teams include your primary Cardiologist (physician) and Advanced Practice Providers (APPs -  Physician Assistants and Nurse Practitioners) who all work together to provide you with the care you need, when you need it.  We recommend signing up for the patient portal called "MyChart".  Sign up information is provided on this After Visit Summary.  MyChart is used to connect with patients for Virtual Visits (Telemedicine).  Patients are able to view lab/test results, encounter notes, upcoming appointments, etc.  Non-urgent messages can be sent to your provider as well.   To learn more about what you can do with MyChart, go to [email or web address].    Your next appointment:    Pending  test results   The format for your next appointment:   In Person  Provider:   Lewayne Bunting, MD   Other Instructions Thank you for choosing [facility]!

## 2020-07-08 NOTE — Telephone Encounter (Signed)
3rd attempt to contact patient.   Will send certified letter.

## 2020-07-16 NOTE — Progress Notes (Signed)
[facility] Summary Report / Loop Recorder 

## 2020-07-20 DIAGNOSIS — L821 Other seborrheic keratosis: Secondary | ICD-10-CM | POA: Diagnosis not present

## 2020-07-20 DIAGNOSIS — L718 Other rosacea: Secondary | ICD-10-CM | POA: Diagnosis not present

## 2020-07-20 DIAGNOSIS — I781 Nevus, non-neoplastic: Secondary | ICD-10-CM | POA: Diagnosis not present

## 2020-07-22 DIAGNOSIS — Z889 Allergy status to unspecified drugs, medicaments and biological substances status: Secondary | ICD-10-CM | POA: Diagnosis not present

## 2020-07-22 DIAGNOSIS — R0789 Other chest pain: Secondary | ICD-10-CM | POA: Diagnosis not present

## 2020-07-22 DIAGNOSIS — R0602 Shortness of breath: Secondary | ICD-10-CM | POA: Diagnosis not present

## 2020-07-24 ENCOUNTER — Ambulatory Visit (HOSPITAL_COMMUNITY)
Admission: RE | Admit: 2020-07-24 | Discharge: 2020-07-24 | Disposition: A | Discharge status: Home / Self Care | Payer: Medicare Other | Source: Ambulatory Visit | Attending: Internal Medicine | Admitting: Internal Medicine

## 2020-07-24 ENCOUNTER — Other Ambulatory Visit: Payer: Self-pay

## 2020-07-24 DIAGNOSIS — I340 Nonrheumatic mitral (valve) insufficiency: Secondary | ICD-10-CM | POA: Diagnosis not present

## 2020-07-24 DIAGNOSIS — R0609 Other forms of dyspnea: Secondary | ICD-10-CM | POA: Diagnosis not present

## 2020-07-24 DIAGNOSIS — R0600 Dyspnea, unspecified: Secondary | ICD-10-CM | POA: Insufficient documentation

## 2020-07-24 DIAGNOSIS — I361 Nonrheumatic tricuspid (valve) insufficiency: Secondary | ICD-10-CM

## 2020-07-24 LAB — ECHOCARDIOGRAM COMPLETE
Area-P 1/2: 2.31 cm2
MV M vel: 5.02 m/s
MV Peak grad: 100.8 mmHg
S' Lateral: 2.75 cm

## 2020-07-24 NOTE — Progress Notes (Signed)
*  PRELIMINARY RESULTS* Echocardiogram 2D Echocardiogram has been performed.  Heather Carey 07/24/2020, 10:13 AM

## 2020-08-05 ENCOUNTER — Telehealth: Payer: Self-pay

## 2020-08-05 NOTE — Telephone Encounter (Signed)
Per Dr. Ladona Ridgel- order CPX to evaluate for sob.  Attempted to call Pt-went to VM.  Sent [employer].

## 2020-08-06 DIAGNOSIS — Z7689 Persons encountering health services in other specified circumstances: Secondary | ICD-10-CM | POA: Diagnosis not present

## 2020-08-06 DIAGNOSIS — R5383 Other fatigue: Secondary | ICD-10-CM | POA: Diagnosis not present

## 2020-08-06 DIAGNOSIS — M2550 Pain in unspecified joint: Secondary | ICD-10-CM | POA: Diagnosis not present

## 2020-08-10 ENCOUNTER — Ambulatory Visit (INDEPENDENT_AMBULATORY_CARE_PROVIDER_SITE_OTHER): Payer: Medicare Other

## 2020-08-10 DIAGNOSIS — R55 Syncope and collapse: Secondary | ICD-10-CM

## 2020-08-11 LAB — CUP PACEART REMOTE DEVICE CHECK
Date Time Interrogation Session: 20220430031427
Implantable Pulse Generator Implant Date: 20190129

## 2020-08-13 ENCOUNTER — Emergency Department (HOSPITAL_COMMUNITY)
Admission: EM | Admit: 2020-08-13 | Discharge: 2020-08-13 | Disposition: A | Discharge status: Home / Self Care | Payer: Medicare Other | Attending: Emergency Medicine | Admitting: Emergency Medicine

## 2020-08-13 ENCOUNTER — Encounter (HOSPITAL_COMMUNITY): Payer: Self-pay | Admitting: *Deleted

## 2020-08-13 ENCOUNTER — Emergency Department (HOSPITAL_COMMUNITY): Payer: Medicare Other

## 2020-08-13 ENCOUNTER — Other Ambulatory Visit: Payer: Self-pay

## 2020-08-13 DIAGNOSIS — Y9289 Other specified places as the place of occurrence of the external cause: Secondary | ICD-10-CM | POA: Diagnosis not present

## 2020-08-13 DIAGNOSIS — S20212A Contusion of left front wall of thorax, initial encounter: Secondary | ICD-10-CM | POA: Diagnosis not present

## 2020-08-13 DIAGNOSIS — W010XXA Fall on same level from slipping, tripping and stumbling without subsequent striking against object, initial encounter: Secondary | ICD-10-CM | POA: Insufficient documentation

## 2020-08-13 DIAGNOSIS — M1712 Unilateral primary osteoarthritis, left knee: Secondary | ICD-10-CM | POA: Diagnosis not present

## 2020-08-13 DIAGNOSIS — E039 Hypothyroidism, unspecified: Secondary | ICD-10-CM | POA: Insufficient documentation

## 2020-08-13 DIAGNOSIS — M25532 Pain in left wrist: Secondary | ICD-10-CM | POA: Diagnosis not present

## 2020-08-13 DIAGNOSIS — S0990XA Unspecified injury of head, initial encounter: Secondary | ICD-10-CM | POA: Diagnosis not present

## 2020-08-13 DIAGNOSIS — M25462 Effusion, left knee: Secondary | ICD-10-CM | POA: Diagnosis not present

## 2020-08-13 DIAGNOSIS — M542 Cervicalgia: Secondary | ICD-10-CM | POA: Diagnosis not present

## 2020-08-13 DIAGNOSIS — Y9389 Activity, other specified: Secondary | ICD-10-CM | POA: Diagnosis not present

## 2020-08-13 DIAGNOSIS — M1612 Unilateral primary osteoarthritis, left hip: Secondary | ICD-10-CM | POA: Diagnosis not present

## 2020-08-13 DIAGNOSIS — R519 Headache, unspecified: Secondary | ICD-10-CM | POA: Diagnosis not present

## 2020-08-13 DIAGNOSIS — S8002XA Contusion of left knee, initial encounter: Secondary | ICD-10-CM | POA: Diagnosis not present

## 2020-08-13 DIAGNOSIS — M19032 Primary osteoarthritis, left wrist: Secondary | ICD-10-CM | POA: Diagnosis not present

## 2020-08-13 DIAGNOSIS — M19042 Primary osteoarthritis, left hand: Secondary | ICD-10-CM | POA: Diagnosis not present

## 2020-08-13 DIAGNOSIS — I10 Essential (primary) hypertension: Secondary | ICD-10-CM | POA: Insufficient documentation

## 2020-08-13 DIAGNOSIS — M4802 Spinal stenosis, cervical region: Secondary | ICD-10-CM | POA: Diagnosis not present

## 2020-08-13 DIAGNOSIS — S299XXA Unspecified injury of thorax, initial encounter: Secondary | ICD-10-CM | POA: Diagnosis present

## 2020-08-13 DIAGNOSIS — Z8673 Personal history of transient ischemic attack (TIA), and cerebral infarction without residual deficits: Secondary | ICD-10-CM | POA: Diagnosis not present

## 2020-08-13 DIAGNOSIS — W19XXXA Unspecified fall, initial encounter: Secondary | ICD-10-CM

## 2020-08-13 DIAGNOSIS — S40022A Contusion of left upper arm, initial encounter: Secondary | ICD-10-CM | POA: Insufficient documentation

## 2020-08-13 DIAGNOSIS — Z79899 Other long term (current) drug therapy: Secondary | ICD-10-CM | POA: Insufficient documentation

## 2020-08-13 DIAGNOSIS — M25552 Pain in left hip: Secondary | ICD-10-CM | POA: Diagnosis not present

## 2020-08-13 DIAGNOSIS — Z043 Encounter for examination and observation following other accident: Secondary | ICD-10-CM | POA: Diagnosis not present

## 2020-08-13 DIAGNOSIS — M47812 Spondylosis without myelopathy or radiculopathy, cervical region: Secondary | ICD-10-CM | POA: Diagnosis not present

## 2020-08-13 DIAGNOSIS — M7989 Other specified soft tissue disorders: Secondary | ICD-10-CM | POA: Diagnosis not present

## 2020-08-13 DIAGNOSIS — J9811 Atelectasis: Secondary | ICD-10-CM | POA: Diagnosis not present

## 2020-08-13 MED ORDER — HYDROCODONE-ACETAMINOPHEN 5-325 MG PO TABS: 1.0000 | ORAL_TABLET | Freq: Four times a day (QID) | ORAL | 0 refills | Status: DC | PRN

## 2020-08-13 NOTE — Telephone Encounter (Signed)
Left message to call back  

## 2020-08-13 NOTE — ED Provider Notes (Signed)
[facility] Provider Note  CSN: [id number] Arrival date & time: 08/13/20 [id number]    History Chief Complaint  Patient presents with  . Fall    HPI  Heather BallRobin Hollice EspyGibson is a 70 y.o. female reports she was chasing her grandson this afternoon when he stumbled and fell in her driveway and then she fell while trying to catch him, landing on her L side and rolling down the hill. She is unsure if she had head injury but denies LOC, complaining of aching pain in L neck, L shoulder, elbow and wrist, worse with movement. Also having pain in L hip and L knee. She has an implanted loop recorder. States she also injured her chest and is hurting from L sternum around her L lower ribs.  Does not take blood thinners.    Past Medical History:  Diagnosis Date  . Diverticulitis   . Eosinophilic esophagitis   . Fibromyalgia   . GERD (gastroesophageal reflux disease)   . Hyperlipidemia   . Hypertension   . Hypothyroidism   . IBS (irritable bowel syndrome)   . Migraines   . Mitral regurgitation 02/28/2014   Mild to moderate by echo 02/2014  . Obesity   . Osteoarthritis   . Otitis externa   . Palpitations   . TIA (transient ischemic attack)     Past Surgical History:  Procedure Laterality Date  . ABDOMINAL HYSTERECTOMY    . carpel tunnel    . lbp gso    . LOOP RECORDER INSERTION N/A 05/08/2017   Procedure: LOOP RECORDER INSERTION;  Surgeon: Marinus Mawaylor, Gregg W, MD;  [facility];  Service: Cardiovascular;  Laterality: N/A;  . TONSILLECTOMY AND ADENOIDECTOMY      Family History  Problem Relation Age of Onset  . Allergic rhinitis Mother   . Urticaria Mother   . Allergic rhinitis Father   . Asthma Father   . Allergic rhinitis Sister   . Angioedema Sister   . Urticaria Sister   . Allergic rhinitis Brother   . Asthma Brother     Social History   Tobacco Use  . Smoking status: Never Smoker  . Smokeless tobacco: Never Used  Vaping Use  . Vaping Use: Never  used  Substance Use Topics  . Alcohol use: Yes    Alcohol/week: 1.0 standard drink    Types: 1 Glasses of wine per week  . Drug use: No     Home Medications Prior to Admission medications   Medication Sig Start Date End Date Taking? Authorizing Provider  HYDROcodone-acetaminophen (NORCO/VICODIN) 5-325 MG tablet Take 1 tablet by mouth every 6 (six) hours as needed for severe pain. 08/13/20  Yes Pollyann SavoySheldon, Jennylee Uehara B, MD  acetaminophen (TYLENOL) 500 MG tablet Take 1,000 mg by mouth 2 (two) times daily as needed (migraine headaches).    [provider]  diphenhydrAMINE (BENADRYL) 12.5 MG chewable tablet Chew 25 mg by mouth 2 (two) times daily as needed (allergic reaction).    [provider]  diphenhydrAMINE (BENADRYL) 2 % cream Apply 1 application topically daily as needed for itching (allergic reactions).    [provider]  EPINEPHrine 0.3 mg/0.3 mL IJ SOAJ injection Inject 0.3 mLs (0.3 mg total) into the muscle once. Patient taking differently: Inject 0.3 mg into the muscle once as needed for anaphylaxis (severe allergic reaction). 04/27/15   Bobbitt, Heywood Ilesalph Carter, MD  Eyelid Cleansers [facility](THERATEARS STERILID CLEANSER) SOLN Place 1 application into both eyes 2 (two) times daily as needed (  to cleanse eyes).    [provider]  levothyroxine (SYNTHROID) 88 MCG tablet Take 88 mcg by mouth daily before breakfast.    [provider]  metoprolol succinate (TOPROL-XL) 25 MG 24 hr tablet Take 12.5 mg by mouth daily.    [provider]  vitamin B-12 (CYANOCOBALAMIN) 1000 MCG tablet Take 1,000 mcg by mouth daily.    [provider]     Allergies    Azithromycin, Codeine, Ivp dye [iodinated diagnostic agents], Natamycin, Other, Rose, Shellfish allergy, Thimerosol [thimerosal], Strawberry extract, Adhesive [tape], Avelox [moxifloxacin hcl in nacl], Bacitracin, Cinnamon, Doxycycline, Ginger, Neomycin, Onion, Sunscreens [aminobenzoate], Watermelon  [citrullus vulgaris], Benzalkonium chloride, Cephalosporins, and Penicillins   Review of Systems   Review of Systems A comprehensive review of systems was completed and negative except as noted in HPI.    Physical Exam BP 115/70   Pulse 68   Temp 98.3 F (36.8 C) (Oral)   Resp 18   Ht 5' 5.5" (1.664 m)   Wt 86.2 kg   SpO2 98%   BMI 31.14 kg/m   Physical Exam Vitals and nursing note reviewed.  Constitutional:      Appearance: Normal appearance.  HENT:     Head: Normocephalic and atraumatic.     Nose: Nose normal.     Mouth/Throat:     Mouth: Mucous membranes are moist.  Eyes:     Extraocular Movements: Extraocular movements intact.     Conjunctiva/sclera: Conjunctivae normal.  Cardiovascular:     Rate and Rhythm: Normal rate.  Pulmonary:     Effort: Pulmonary effort is normal.     Breath sounds: Normal breath sounds.  Chest:     Chest wall: Tenderness (midsternal and L ribs) present.  Abdominal:     General: Abdomen is flat.     Palpations: Abdomen is soft.     Tenderness: There is no abdominal tenderness.  Musculoskeletal:        General: Tenderness (L shoulder, L elbow and L wrist, L knee and hip) present. No swelling or deformity. Normal range of motion.     Cervical back: Tenderness (L paraspinal ) present.  Skin:    General: Skin is warm and dry.     Findings: Bruising (L hand and L knee) present.  Neurological:     General: No focal deficit present.     Mental Status: She is alert.  Psychiatric:        Mood and Affect: Mood normal.      ED Results / Procedures / Treatments   Labs (all labs ordered are listed, but only abnormal results are displayed) Labs Reviewed - No data to display  EKG EKG Interpretation  Date/Time:  Thursday Aug 13 2020 19:03:03 EDT Ventricular Rate:  75 PR Interval:  152 QRS Duration: 80 QT Interval:  384 QTC Calculation: 428 R Axis:   37 Text Interpretation: Normal sinus rhythm Septal infarct , age undetermined  Abnormal ECG No significant change since last tracing Confirmed by Susy Frizzle [id number]) on 08/13/2020 7:22:55 PM   Radiology DG Chest 2 View  Result Date: 08/13/2020 CLINICAL DATA:  Status post fall. EXAM: CHEST - 2 VIEW COMPARISON:  April 20, 2017 FINDINGS: Mild linear scarring and/or atelectasis is seen within the left lung base. There is no evidence of acute infiltrate, pleural effusion or pneumothorax. The heart size and mediastinal contours are within normal limits. A radiopaque loop recorder device is seen. The visualized skeletal structures are unremarkable. IMPRESSION: Mild left basilar linear  scarring and/or atelectasis. Electronically Signed   By: Aram Candela M.D.   On: 08/13/2020 20:56   DG Ribs Unilateral W/Chest Left  Result Date: 08/13/2020 CLINICAL DATA:  Status post fall. EXAM: LEFT RIBS AND CHEST - 3+ VIEW COMPARISON:  None. FINDINGS: No fracture or other bone lesions are seen involving the ribs. There is no evidence of pneumothorax or pleural effusion. Mild linear scarring and/or atelectasis is seen within the left lung base. Heart size and mediastinal contours are within normal limits. A radiopaque loop recorder device is seen. IMPRESSION: No acute osseous abnormality. Electronically Signed   By: Aram Candela M.D.   On: 08/13/2020 20:57   DG Elbow Complete Left  Result Date: 08/13/2020 CLINICAL DATA:  Status post fall. EXAM: LEFT ELBOW - COMPLETE 3+ VIEW COMPARISON:  None. FINDINGS: There is no evidence of fracture, dislocation, or joint effusion. There is no evidence of arthropathy or other focal bone abnormality. Soft tissues are unremarkable. IMPRESSION: Negative. Electronically Signed   By: Aram Candela M.D.   On: 08/13/2020 20:37   DG Wrist Complete Left  Result Date: 08/13/2020 CLINICAL DATA:  Status post fall. EXAM: LEFT WRIST - COMPLETE 3+ VIEW COMPARISON:  None. FINDINGS: There is no evidence of an acute fracture or dislocation. Moderate severity  degenerative changes seen along the carpometacarpal articulation of the left thumb. Soft tissues are unremarkable. IMPRESSION: Degenerative changes without evidence of an acute osseous abnormality. Electronically Signed   By: Aram Candela M.D.   On: 08/13/2020 20:40   CT Head Wo Contrast  Result Date: 08/13/2020 CLINICAL DATA:  Status post fall. EXAM: CT HEAD WITHOUT CONTRAST TECHNIQUE: Contiguous axial images were obtained from the base of the skull through the vertex without intravenous contrast. COMPARISON:  April 05, 2020 FINDINGS: Brain: No evidence of acute infarction, hemorrhage, hydrocephalus, extra-axial collection or mass lesion/mass effect. Vascular: No hyperdense vessel or unexpected calcification. Skull: Normal. Negative for fracture or focal lesion. Sinuses/Orbits: No acute finding. Other: None. IMPRESSION: No acute intracranial abnormality. Electronically Signed   By: Aram Candela M.D.   On: 08/13/2020 20:48   CT Cervical Spine Wo Contrast  Result Date: 08/13/2020 CLINICAL DATA:  Status post fall. EXAM: CT CERVICAL SPINE WITHOUT CONTRAST TECHNIQUE: Multidetector CT imaging of the cervical spine was performed without intravenous contrast. Multiplanar CT image reconstructions were also generated. COMPARISON:  None. FINDINGS: Alignment: Normal. Skull base and vertebrae: No acute fracture. A 4 mm benign-appearing sclerotic focus is seen within the C2 vertebral body on the left. Soft tissues and spinal canal: No prevertebral fluid or swelling. No visible canal hematoma. Disc levels: Mild anterior osteophyte formation is seen at the level of C3-C4. Mild endplate sclerosis is noted at the level of C6-C7. Mild intervertebral disc space narrowing is seen at the level of C6-C7. Mild bilateral multilevel facet joint hypertrophy is noted. Upper chest: A 5 mm noncalcified lung nodule is seen posterior aspect of the right apex. Other: None. IMPRESSION: 1. No acute cervical spine fracture or  subluxation. 2. Mild multilevel degenerative changes, most prominent at the level of C6-C7. 3. 5 mm noncalcified right apical lung nodule. Correlation with nonemergent chest CT is recommended to determine the presence or absence of additional lung nodules. Electronically Signed   By: Aram Candela M.D.   On: 08/13/2020 20:54   DG Shoulder Left  Result Date: 08/13/2020 CLINICAL DATA:  Status post fall. EXAM: LEFT SHOULDER - 2+ VIEW COMPARISON:  None. FINDINGS: There is no evidence of fracture or dislocation.  There is no evidence of arthropathy or other focal bone abnormality. Mild lateral soft tissue swelling is seen. IMPRESSION: Mild lateral soft tissue swelling without an acute osseous abnormality. Electronically Signed   By: Aram Candela M.D.   On: 08/13/2020 20:35   DG Knee Complete 4 Views Left  Result Date: 08/13/2020 CLINICAL DATA:  Status post fall. EXAM: LEFT KNEE - COMPLETE 4+ VIEW COMPARISON:  None. FINDINGS: No evidence of an acute fracture or dislocation. A chronic appearing cortical density is seen adjacent to the proximal tip of the left fibula. Moderate to marked severity lateral tibiofemoral compartment space narrowing is seen. Mild lateral tibiofemoral compartment space narrowing is also noted. A small joint effusion is noted. IMPRESSION: 1. Degenerative changes without an acute osseous abnormality. 2. Small joint effusion. Electronically Signed   By: Aram Candela M.D.   On: 08/13/2020 20:45   DG Hip Unilat With Pelvis 2-3 Views Left  Result Date: 08/13/2020 CLINICAL DATA:  Status post fall. EXAM: DG HIP (WITH OR WITHOUT PELVIS) 2-3V LEFT COMPARISON:  None. FINDINGS: There is no evidence of hip fracture or dislocation. Degenerative changes are seen in the form of joint space narrowing and acetabular sclerosis. IMPRESSION: No acute osseous abnormality. Electronically Signed   By: Aram Candela M.D.   On: 08/13/2020 20:41    Procedures Procedures  Medications Ordered  in the ED Medications - No data to display   MDM Rules/Calculators/A&P MDM Imaging ordered for her areas of pain.  ED Course  I have reviewed the triage vital signs and the nursing notes.  Pertinent labs & imaging results that were available during my care of the patient were reviewed by me and considered in my medical decision making (see chart for details).  Clinical Course as of 08/13/20 2206  Thu Aug 13, 2020  2204 Imaging results reviewed with the patient, no fractures are found. I offered pain medication which she declines here. Will give Rx for norco in case she needs it for her rib pain, although she has indicated she does not think she will need it. PCP follow up.  [CS]    Clinical Course User Index [CS] Pollyann Savoy, MD    Final Clinical Impression(s) / ED Diagnoses Final diagnoses:  Fall, initial encounter  Contusion of left chest wall, initial encounter  Arm contusion, left, initial encounter  Contusion of left knee and lower leg, initial encounter    Rx / DC Orders ED Discharge Orders         Ordered    HYDROcodone-acetaminophen (NORCO/VICODIN) 5-325 MG tablet  Every 6 hours PRN        08/13/20 2206           Pollyann Savoy, MD 08/13/20 2206

## 2020-08-13 NOTE — ED Notes (Signed)
Patient transported to X-ray 

## 2020-08-13 NOTE — ED Notes (Signed)
Pt c/o pain across chest and radiates around ribs on left.  C/o left arm/shoulder and left neck pain with HA.

## 2020-08-13 NOTE — ED Notes (Signed)
Pt verbalized understanding of no driving and to use caution within 4 hours of taking pain meds due to meds cause drowsiness 

## 2020-08-13 NOTE — ED Triage Notes (Signed)
Larey Seat while chasing grandson, Chest hurts when moving. Pain in left shoulder, neck and left hip

## 2020-08-24 ENCOUNTER — Ambulatory Visit
Admission: EM | Admit: 2020-08-24 | Discharge: 2020-08-24 | Disposition: A | Discharge status: Home / Self Care | Payer: Medicare Other | Attending: Emergency Medicine | Admitting: Emergency Medicine

## 2020-08-24 ENCOUNTER — Other Ambulatory Visit: Payer: Self-pay

## 2020-08-24 DIAGNOSIS — R112 Nausea with vomiting, unspecified: Secondary | ICD-10-CM | POA: Diagnosis not present

## 2020-08-24 DIAGNOSIS — Z20822 Contact with and (suspected) exposure to covid-19: Secondary | ICD-10-CM

## 2020-08-24 DIAGNOSIS — R509 Fever, unspecified: Secondary | ICD-10-CM | POA: Diagnosis not present

## 2020-08-24 DIAGNOSIS — Z7689 Persons encountering health services in other specified circumstances: Secondary | ICD-10-CM | POA: Diagnosis not present

## 2020-08-24 DIAGNOSIS — R002 Palpitations: Secondary | ICD-10-CM | POA: Diagnosis not present

## 2020-08-24 DIAGNOSIS — R110 Nausea: Secondary | ICD-10-CM | POA: Diagnosis not present

## 2020-08-24 DIAGNOSIS — R519 Headache, unspecified: Secondary | ICD-10-CM

## 2020-08-24 DIAGNOSIS — R059 Cough, unspecified: Secondary | ICD-10-CM

## 2020-08-24 MED ORDER — ONDANSETRON 4 MG PO TBDP
4.0000 mg | ORAL_TABLET | Freq: Once | ORAL | Status: AC
  Administered 2020-08-24: 4 mg via ORAL

## 2020-08-24 MED ORDER — ONDANSETRON 4 MG PO TBDP: 4.0000 mg | ORAL_TABLET | Freq: Three times a day (TID) | ORAL | 0 refills | Status: DC | PRN

## 2020-08-24 MED ORDER — KETOROLAC TROMETHAMINE 30 MG/ML IJ SOLN
30.0000 mg | Freq: Once | INTRAMUSCULAR | Status: AC
  Administered 2020-08-24: 30 mg via INTRAMUSCULAR

## 2020-08-24 NOTE — Discharge Instructions (Addendum)
I am concerned about covid-19.  Self isolate until covid results are back.  We will notify you by phone if it is positive. Your negative results will be sent through your MyChart.    If it is positive you need to isolate from others for a total of 5 days. If no fever for 24 hours without medications, and symptoms improving you may end isolation on day 6, but wear a mask if around any others for an additional 5 days.   Zofran every 8 hours as needed for nausea or vomiting.   Small frequent sips of fluids- Pedialyte, Gatorade, water, broth- to maintain hydration.   Don't take additional ibuprofen today for headache, may continue with tylenol as needed.  If symptoms worsen or do not improve in the next week to return to be seen or to follow up with you PCP.   Go to the ER for any worsening of symptoms.

## 2020-08-24 NOTE — ED Triage Notes (Signed)
Pt presents with c/o body aches . Chills and cough since Friday, took home covid test and was negative

## 2020-08-24 NOTE — ED Provider Notes (Signed)
[facility]    CSN: [id number] Arrival date & time: 08/24/20  1018      History   Chief Complaint Chief Complaint  Patient presents with  . Generalized Body Aches  . Fever    HPI Heather Carey is a 70 y.o. female.   Heather Carey presents with complaints of headache, body aches, fatigue, weakness, nausea and vomiting which started around three days ago. Dry cough. Night sweats. Family members ill now as well. No diarrhea. No shortness of breath . No skin rash. Minimal to no nasal drainage or congestion. No abdominal pain. Minimal intake due to nausea and vomiting. No history of covid-19. She had been at the hospital after a fall 5/5.    ROS per HPI, negative if not otherwise mentioned.      Past Medical History:  Diagnosis Date  . Diverticulitis   . Eosinophilic esophagitis   . Fibromyalgia   . GERD (gastroesophageal reflux disease)   . Hyperlipidemia   . Hypertension   . Hypothyroidism   . IBS (irritable bowel syndrome)   . Migraines   . Mitral regurgitation 02/28/2014   Mild to moderate by echo 02/2014  . Obesity   . Osteoarthritis   . Otitis externa   . Palpitations   . TIA (transient ischemic attack)     Patient Active Problem List   Diagnosis Date Noted  . Malnutrition of moderate degree 03/09/2016  . Syncope 03/08/2016  . Sensory disturbance 03/08/2016  . Hypothyroidism 03/08/2016  . Urticaria with associated angioedema 04/27/2015  . Angioedema 04/27/2015  . Allergic reaction 01/07/2015  . Food allergy 01/07/2015  . Mitral regurgitation 02/28/2014  . MVP (mitral valve prolapse) 02/24/2014  . SOB (shortness of breath) 02/24/2014  . Snoring 02/24/2014  . Hypertension   . Hyperlipidemia   . Chest pain 02/10/2014    Past Surgical History:  Procedure Laterality Date  . ABDOMINAL HYSTERECTOMY    . carpel tunnel    . lbp gso    . LOOP RECORDER INSERTION N/A 05/08/2017   Procedure: LOOP RECORDER INSERTION;  Surgeon: Marinus Maw,  MD;  Location: [facility] INVASIVE CV LAB;  Service: Cardiovascular;  Laterality: N/A;  . TONSILLECTOMY AND ADENOIDECTOMY      OB History   No obstetric history on file.      Home Medications    Prior to Admission medications   Medication Sig Start Date End Date Taking? Authorizing Provider  ondansetron (ZOFRAN-ODT) 4 MG disintegrating tablet Take 1 tablet (4 mg total) by mouth every 8 (eight) hours as needed for nausea or vomiting. 08/24/20  Yes Linus Mako B, NP  acetaminophen (TYLENOL) 500 MG tablet Take 1,000 mg by mouth 2 (two) times daily as needed (migraine headaches).    [provider]  diphenhydrAMINE (BENADRYL) 12.5 MG chewable tablet Chew 25 mg by mouth 2 (two) times daily as needed (allergic reaction).    [provider]  diphenhydrAMINE (BENADRYL) 2 % cream Apply 1 application topically daily as needed for itching (allergic reactions).    [provider]  EPINEPHrine 0.3 mg/0.3 mL IJ SOAJ injection Inject 0.3 mLs (0.3 mg total) into the muscle once. Patient taking differently: Inject 0.3 mg into the muscle once as needed for anaphylaxis (severe allergic reaction). 04/27/15   Bobbitt, Heywood Iles, MD  Eyelid Cleansers [facility] STERILID CLEANSER) SOLN Place 1 application into both eyes 2 (two) times daily as needed (to cleanse eyes).    [provider]  HYDROcodone-acetaminophen (NORCO/VICODIN) 5-325 MG tablet  Take 1 tablet by mouth every 6 (six) hours as needed for severe pain. 08/13/20   Pollyann Savoy, MD  levothyroxine (SYNTHROID) 88 MCG tablet Take 88 mcg by mouth daily before breakfast.    [provider]  metoprolol succinate (TOPROL-XL) 25 MG 24 hr tablet Take 12.5 mg by mouth daily.    [provider]  vitamin B-12 (CYANOCOBALAMIN) 1000 MCG tablet Take 1,000 mcg by mouth daily.    [provider]    Family History Family History  Problem Relation Age of Onset  . Allergic rhinitis Mother   . Urticaria  Mother   . Allergic rhinitis Father   . Asthma Father   . Allergic rhinitis Sister   . Angioedema Sister   . Urticaria Sister   . Allergic rhinitis Brother   . Asthma Brother     Social History Social History   Tobacco Use  . Smoking status: Never Smoker  . Smokeless tobacco: Never Used  Vaping Use  . Vaping Use: Never used  Substance Use Topics  . Alcohol use: Yes    Alcohol/week: 1.0 standard drink    Types: 1 Glasses of wine per week  . Drug use: No     Allergies   Azithromycin, Codeine, Ivp dye [iodinated diagnostic agents], Natamycin, Other, Rose, Shellfish allergy, Thimerosol [thimerosal], Strawberry extract, Adhesive [tape], Avelox [moxifloxacin hcl in nacl], Bacitracin, Cinnamon, Doxycycline, Ginger, Neomycin, Onion, Sunscreens [aminobenzoate], Watermelon [citrullus vulgaris], Benzalkonium chloride, Cephalosporins, and Penicillins   Review of Systems Review of Systems   Physical Exam Triage Vital Signs ED Triage Vitals  Enc Vitals Group     BP 08/24/20 1341 103/62     Pulse Rate 08/24/20 1313 98     Resp 08/24/20 1313 (!) 22     Temp 08/24/20 1313 98.8 F (37.1 C)     Temp src --      SpO2 08/24/20 1313 97 %     Weight --      Height --      Head Circumference --      Peak Flow --      Pain Score 08/24/20 1311 7     Pain Loc --      Pain Edu? --      Excl. in GC? --    No data found.  Updated Vital Signs BP 103/62   Pulse 98   Temp 98.8 F (37.1 C)   Resp (!) 22   SpO2 97%   Visual Acuity Right Eye Distance:   Left Eye Distance:   Bilateral Distance:    Right Eye Near:   Left Eye Near:    Bilateral Near:     Physical Exam Constitutional:      General: She is not in acute distress.    Appearance: She is well-developed. She is ill-appearing.  Cardiovascular:     Rate and Rhythm: Normal rate.  Pulmonary:     Effort: Pulmonary effort is normal.  Abdominal:     Tenderness: There is no abdominal tenderness.  Skin:    General: Skin  is warm and dry.  Neurological:     Mental Status: She is alert and oriented to person, place, and time.      UC Treatments / Results  Labs (all labs ordered are listed, but only abnormal results are displayed) Labs Reviewed  COVID-19, FLU A+B NAA  COMPREHENSIVE METABOLIC PANEL    EKG   Radiology No results found.  Procedures Procedures (including critical care time)  Medications Ordered in UC Medications  ketorolac (TORADOL) 30 MG/ML injection 30 mg (has no administration in time range)  ondansetron (ZOFRAN-ODT) disintegrating tablet 4 mg (has no administration in time range)    Initial Impression / Assessment and Plan / UC Course  I have reviewed the triage vital signs and the nursing notes.  Pertinent labs & imaging results that were available during my care of the patient were reviewed by me and considered in my medical decision making (see chart for details).     No significant tachycardia. Low/normal blood pressure. No active vomiting. Highly suspicious for covid-19 with testing collected and pending. History and physical consistent with viral illness.  Supportive cares recommended. cmp collected as she would meet criteria for paxlovid if she is positive. Return precautions provided. Patient verbalized understanding and agreeable to plan.   Final Clinical Impressions(s) / UC Diagnoses   Final diagnoses:  Encounter for screening laboratory testing for COVID-19 virus  Non-intractable vomiting with nausea, unspecified vomiting type  Cough  Acute nonintractable headache, unspecified headache type     Discharge Instructions     I am concerned about covid-19.  Self isolate until covid results are back.  We will notify you by phone if it is positive. Your negative results will be sent through your MyChart.    If it is positive you need to isolate from others for a total of 5 days. If no fever for 24 hours without medications, and symptoms improving you may end  isolation on day 6, but wear a mask if around any others for an additional 5 days.   Zofran every 8 hours as needed for nausea or vomiting.   Small frequent sips of fluids- Pedialyte, Gatorade, water, broth- to maintain hydration.   Don't take additional ibuprofen today for headache, may continue with tylenol as needed.  If symptoms worsen or do not improve in the next week to return to be seen or to follow up with you PCP.   Go to the ER for any worsening of symptoms.    ED Prescriptions    Medication Sig Dispense Auth. Provider   ondansetron (ZOFRAN-ODT) 4 MG disintegrating tablet Take 1 tablet (4 mg total) by mouth every 8 (eight) hours as needed for nausea or vomiting. 12 tablet Georgetta Haber, NP     PDMP not reviewed this encounter.   Georgetta Haber, NP 08/24/20 1425

## 2020-08-24 NOTE — ED Triage Notes (Signed)
Pt also has had vomiting, requesting iv

## 2020-08-25 LAB — COMPREHENSIVE METABOLIC PANEL
ALT: 19 IU/L (ref 0–32)
AST: 16 IU/L (ref 0–40)
Albumin/Globulin Ratio: 1.4 (ref 1.2–2.2)
Albumin: 4.3 g/dL (ref 3.8–4.8)
Alkaline Phosphatase: 86 IU/L (ref 44–121)
BUN/Creatinine Ratio: 12 (ref 12–28)
BUN: 9 mg/dL (ref 8–27)
Bilirubin Total: 0.6 mg/dL (ref 0.0–1.2)
CO2: 20 mmol/L (ref 20–29)
Calcium: 8.5 mg/dL — ABNORMAL LOW (ref 8.7–10.3)
Chloride: 99 mmol/L (ref 96–106)
Creatinine, Ser: 0.76 mg/dL (ref 0.57–1.00)
Globulin, Total: 3 g/dL (ref 1.5–4.5)
Glucose: 115 mg/dL — ABNORMAL HIGH (ref 65–99)
Potassium: 4.1 mmol/L (ref 3.5–5.2)
Sodium: 136 mmol/L (ref 134–144)
Total Protein: 7.3 g/dL (ref 6.0–8.5)
eGFR: 85 mL/min/{1.73_m2} (ref >59)

## 2020-08-25 LAB — COVID-19, FLU A+B NAA
Influenza A, NAA: NOT DETECTED
Influenza B, NAA: NOT DETECTED
SARS-CoV-2, NAA: NOT DETECTED

## 2020-08-26 ENCOUNTER — Emergency Department (HOSPITAL_COMMUNITY): Payer: Medicare Other

## 2020-08-26 ENCOUNTER — Emergency Department (HOSPITAL_COMMUNITY)
Admission: EM | Admit: 2020-08-26 | Discharge: 2020-08-26 | Disposition: A | Discharge status: Home / Self Care | Payer: Medicare Other | Attending: Emergency Medicine | Admitting: Emergency Medicine

## 2020-08-26 ENCOUNTER — Other Ambulatory Visit: Payer: Self-pay

## 2020-08-26 ENCOUNTER — Encounter (HOSPITAL_COMMUNITY): Payer: Self-pay

## 2020-08-26 DIAGNOSIS — R531 Weakness: Secondary | ICD-10-CM | POA: Diagnosis not present

## 2020-08-26 DIAGNOSIS — J209 Acute bronchitis, unspecified: Secondary | ICD-10-CM | POA: Diagnosis not present

## 2020-08-26 DIAGNOSIS — J069 Acute upper respiratory infection, unspecified: Secondary | ICD-10-CM | POA: Diagnosis not present

## 2020-08-26 DIAGNOSIS — E039 Hypothyroidism, unspecified: Secondary | ICD-10-CM | POA: Insufficient documentation

## 2020-08-26 DIAGNOSIS — J9811 Atelectasis: Secondary | ICD-10-CM | POA: Diagnosis not present

## 2020-08-26 DIAGNOSIS — D649 Anemia, unspecified: Secondary | ICD-10-CM | POA: Diagnosis not present

## 2020-08-26 DIAGNOSIS — R1110 Vomiting, unspecified: Secondary | ICD-10-CM | POA: Diagnosis not present

## 2020-08-26 DIAGNOSIS — Z20822 Contact with and (suspected) exposure to covid-19: Secondary | ICD-10-CM | POA: Diagnosis not present

## 2020-08-26 DIAGNOSIS — R110 Nausea: Secondary | ICD-10-CM | POA: Diagnosis not present

## 2020-08-26 DIAGNOSIS — Z79899 Other long term (current) drug therapy: Secondary | ICD-10-CM | POA: Diagnosis not present

## 2020-08-26 DIAGNOSIS — R519 Headache, unspecified: Secondary | ICD-10-CM | POA: Diagnosis not present

## 2020-08-26 DIAGNOSIS — B9789 Other viral agents as the cause of diseases classified elsewhere: Secondary | ICD-10-CM | POA: Diagnosis not present

## 2020-08-26 DIAGNOSIS — R059 Cough, unspecified: Secondary | ICD-10-CM | POA: Diagnosis not present

## 2020-08-26 DIAGNOSIS — R5381 Other malaise: Secondary | ICD-10-CM | POA: Insufficient documentation

## 2020-08-26 LAB — CBC WITH DIFFERENTIAL/PLATELET
Abs Immature Granulocytes: 0.13 10*3/uL — ABNORMAL HIGH (ref 0.00–0.07)
Basophils Absolute: 0 10*3/uL (ref 0.0–0.1)
Basophils Relative: 0 %
Eosinophils Absolute: 0 10*3/uL (ref 0.0–0.5)
Eosinophils Relative: 0 %
HCT: 33.6 % — ABNORMAL LOW (ref 36.0–46.0)
Hemoglobin: 11.6 g/dL — ABNORMAL LOW (ref 12.0–15.0)
Immature Granulocytes: 1 %
Lymphocytes Relative: 8 %
Lymphs Abs: 0.8 10*3/uL (ref 0.7–4.0)
MCH: 31.4 pg (ref 26.0–34.0)
MCHC: 34.5 g/dL (ref 30.0–36.0)
MCV: 91.1 fL (ref 80.0–100.0)
Monocytes Absolute: 1.1 10*3/uL — ABNORMAL HIGH (ref 0.1–1.0)
Monocytes Relative: 10 %
Neutro Abs: 8.3 10*3/uL — ABNORMAL HIGH (ref 1.7–7.7)
Neutrophils Relative %: 81 %
Platelets: 242 10*3/uL (ref 150–400)
RBC: 3.69 MIL/uL — ABNORMAL LOW (ref 3.87–5.11)
RDW: 13.1 % (ref 11.5–15.5)
WBC: 10.4 10*3/uL (ref 4.0–10.5)
nRBC: 0 % (ref 0.0–0.2)

## 2020-08-26 LAB — COMPREHENSIVE METABOLIC PANEL
ALT: 34 U/L (ref 0–44)
AST: 26 U/L (ref 15–41)
Albumin: 2.9 g/dL — ABNORMAL LOW (ref 3.5–5.0)
Alkaline Phosphatase: 88 U/L (ref 38–126)
Anion gap: 8 (ref 5–15)
BUN: 15 mg/dL (ref 8–23)
CO2: 23 mmol/L (ref 22–32)
Calcium: 8 mg/dL — ABNORMAL LOW (ref 8.9–10.3)
Chloride: 98 mmol/L (ref 98–111)
Creatinine, Ser: 0.79 mg/dL (ref 0.44–1.00)
GFR, Estimated: >60 mL/min (ref >60)
Glucose, Bld: 142 mg/dL — ABNORMAL HIGH (ref 70–99)
Potassium: 4 mmol/L (ref 3.5–5.1)
Sodium: 129 mmol/L — ABNORMAL LOW (ref 135–145)
Total Bilirubin: 0.6 mg/dL (ref 0.3–1.2)
Total Protein: 6.7 g/dL (ref 6.5–8.1)

## 2020-08-26 LAB — URINALYSIS, ROUTINE W REFLEX MICROSCOPIC
Bilirubin Urine: NEGATIVE
Glucose, UA: NEGATIVE mg/dL
Hgb urine dipstick: NEGATIVE
Ketones, ur: NEGATIVE mg/dL
Nitrite: NEGATIVE
Protein, ur: 30 mg/dL — AB
Specific Gravity, Urine: 1.012 (ref 1.005–1.030)
pH: 6 (ref 5.0–8.0)

## 2020-08-26 LAB — RESP PANEL BY RT-PCR (FLU A&B, COVID) ARPGX2
Influenza A by PCR: NEGATIVE
Influenza B by PCR: NEGATIVE
SARS Coronavirus 2 by RT PCR: NEGATIVE

## 2020-08-26 MED ORDER — ONDANSETRON HCL 4 MG/2ML IJ SOLN
4.0000 mg | Freq: Once | INTRAMUSCULAR | Status: AC
  Administered 2020-08-26: 4 mg via INTRAVENOUS
  Filled 2020-08-26: qty 2

## 2020-08-26 MED ORDER — BENZONATATE 100 MG PO CAPS: 100.0000 mg | ORAL_CAPSULE | Freq: Three times a day (TID) | ORAL | 0 refills | Status: DC

## 2020-08-26 MED ORDER — SODIUM CHLORIDE 0.9 % IV BOLUS
1000.0000 mL | Freq: Once | INTRAVENOUS | Status: AC
  Administered 2020-08-26: 1000 mL via INTRAVENOUS

## 2020-08-26 MED ORDER — PREDNISONE 10 MG (21) PO TBPK: ORAL_TABLET | ORAL | 0 refills | Status: DC

## 2020-08-26 NOTE — ED Triage Notes (Signed)
Pt presented to ED with symptoms  starting Fri  and urgent care visit on Monday.  Pt c/o nausea, vomiting,  Bad headache , general body aches, cough with red and yellow sputum, unable to keep po drink and food down. Pt took covid vaccines and booster, tested negative at Urgent Care for flu/and covid. Everyone else in the household has the same symptoms.

## 2020-08-26 NOTE — ED Provider Notes (Signed)
[facility] EMERGENCY DEPARTMENT Provider Note  CSN: [id number] Arrival date & time: 08/26/20 0600    History Chief Complaint  Patient presents with  . Weakness    HPI  Heather Carey is a 70 y.o. female presents for evaluation of febrile illness for the last 5-6 days. Described as fever, cough, general malaise, nausea without vomiting. She was seen at UC 2 days ago and Covid/Flu was negative. She had some blood in sputum last night but yellow sputum today. She has had general weakness and poor PO intake. Several other family members are also sick.   Past Medical History:  Diagnosis Date  . Diverticulitis   . Eosinophilic esophagitis   . Fibromyalgia   . GERD (gastroesophageal reflux disease)   . Hyperlipidemia   . Hypothyroidism   . IBS (irritable bowel syndrome)   . Migraines   . Mitral regurgitation 02/28/2014   Mild to moderate by echo 02/2014  . Obesity   . Osteoarthritis   . Otitis externa   . Palpitations   . TIA (transient ischemic attack)     Past Surgical History:  Procedure Laterality Date  . ABDOMINAL HYSTERECTOMY    . carpel tunnel    . lbp gso    . LOOP RECORDER INSERTION N/A 05/08/2017   Procedure: LOOP RECORDER INSERTION;  Surgeon: Marinus Maw, MD;  Location: [facility] INVASIVE CV LAB;  Service: Cardiovascular;  Laterality: N/A;  . TONSILLECTOMY AND ADENOIDECTOMY      Family History  Problem Relation Age of Onset  . Allergic rhinitis Mother   . Urticaria Mother   . Allergic rhinitis Father   . Asthma Father   . Allergic rhinitis Sister   . Angioedema Sister   . Urticaria Sister   . Allergic rhinitis Brother   . Asthma Brother     Social History   Tobacco Use  . Smoking status: Never Smoker  . Smokeless tobacco: Never Used  Vaping Use  . Vaping Use: Never used  Substance Use Topics  . Alcohol use: Not Currently    Alcohol/week: 1.0 standard drink    Types: 1 Glasses of wine per week  . Drug use: No     Home Medications Prior to  Admission medications   Medication Sig Start Date End Date Taking? Authorizing Provider  benzonatate (TESSALON) 100 MG capsule Take 1 capsule (100 mg total) by mouth every 8 (eight) hours. 08/26/20  Yes Pollyann Savoy, MD  predniSONE (STERAPRED UNI-PAK 21 TAB) 10 MG (21) TBPK tablet 10mg  Tabs, 6 day taper. Use as directed 08/26/20  Yes 08/28/20, MD  acetaminophen (TYLENOL) 500 MG tablet Take 1,000 mg by mouth 2 (two) times daily as needed (migraine headaches).    [provider]  diphenhydrAMINE (BENADRYL) 12.5 MG chewable tablet Chew 25 mg by mouth 2 (two) times daily as needed (allergic reaction).    [provider]  diphenhydrAMINE (BENADRYL) 2 % cream Apply 1 application topically daily as needed for itching (allergic reactions).    [provider]  EPINEPHrine 0.3 mg/0.3 mL IJ SOAJ injection Inject 0.3 mLs (0.3 mg total) into the muscle once. Patient taking differently: Inject 0.3 mg into the muscle once as needed for anaphylaxis (severe allergic reaction). 04/27/15   Bobbitt, 04/29/15, MD  Eyelid Cleansers [facility] STERILID CLEANSER) SOLN Place 1 application into both eyes 2 (two) times daily as needed (to cleanse eyes).    [provider]  HYDROcodone-acetaminophen (NORCO/VICODIN) 5-325 MG tablet Take 1 tablet by  mouth every 6 (six) hours as needed for severe pain. 08/13/20   Pollyann Savoy, MD  levothyroxine (SYNTHROID) 88 MCG tablet Take 88 mcg by mouth daily before breakfast.    [provider]  metoprolol succinate (TOPROL-XL) 25 MG 24 hr tablet Take 12.5 mg by mouth daily.    [provider]  ondansetron (ZOFRAN-ODT) 4 MG disintegrating tablet Take 1 tablet (4 mg total) by mouth every 8 (eight) hours as needed for nausea or vomiting. 08/24/20   Georgetta Haber, NP  vitamin B-12 (CYANOCOBALAMIN) 1000 MCG tablet Take 1,000 mcg by mouth daily.    [provider]     Allergies    Azithromycin, Codeine, Ivp  dye [iodinated diagnostic agents], Natamycin, Other, Rose, Shellfish allergy, Thimerosol [thimerosal], Strawberry extract, Adhesive [tape], Avelox [moxifloxacin hcl in nacl], Bacitracin, Cinnamon, Doxycycline, Ginger, Neomycin, Onion, Sunscreens [aminobenzoate], Watermelon [citrullus vulgaris], Benzalkonium chloride, Cephalosporins, and Penicillins   Review of Systems   Review of Systems A comprehensive review of systems was completed and negative except as noted in HPI.    Physical Exam BP 107/67   Pulse 76   Temp 98.8 F (37.1 C) (Oral)   Resp 18   Ht 5' 5.5" (1.664 m)   Wt 86.2 kg   SpO2 98%   BMI 31.14 kg/m   Physical Exam Vitals and nursing note reviewed.  Constitutional:      Appearance: Normal appearance.  HENT:     Head: Normocephalic and atraumatic.     Nose: Nose normal.     Mouth/Throat:     Mouth: Mucous membranes are moist.  Eyes:     Extraocular Movements: Extraocular movements intact.     Conjunctiva/sclera: Conjunctivae normal.  Cardiovascular:     Rate and Rhythm: Normal rate.  Pulmonary:     Effort: Pulmonary effort is normal.     Breath sounds: Normal breath sounds.  Abdominal:     General: Abdomen is flat.     Palpations: Abdomen is soft.     Tenderness: There is no abdominal tenderness.  Musculoskeletal:        General: No swelling. Normal range of motion.     Cervical back: Neck supple.  Skin:    General: Skin is warm and dry.  Neurological:     General: No focal deficit present.     Mental Status: She is alert.  Psychiatric:        Mood and Affect: Mood normal.      ED Results / Procedures / Treatments   Labs (all labs ordered are listed, but only abnormal results are displayed) Labs Reviewed  COMPREHENSIVE METABOLIC PANEL - Abnormal; Notable for the following components:      Result Value   Sodium 129 (*)    Glucose, Bld 142 (*)    Calcium 8.0 (*)    Albumin 2.9 (*)    All other components within normal limits  CBC WITH  DIFFERENTIAL/PLATELET - Abnormal; Notable for the following components:   RBC 3.69 (*)    Hemoglobin 11.6 (*)    HCT 33.6 (*)    Neutro Abs 8.3 (*)    Monocytes Absolute 1.1 (*)    Abs Immature Granulocytes 0.13 (*)    All other components within normal limits  URINALYSIS, ROUTINE W REFLEX MICROSCOPIC - Abnormal; Notable for the following components:   Protein, ur 30 (*)    Leukocytes,Ua MODERATE (*)    Bacteria, UA RARE (*)    All other components within normal limits  RESP PANEL BY RT-PCR (FLU A&B, COVID) [id number]    EKG None  Radiology DG Chest Port 1 View  Result Date: 08/26/2020 CLINICAL DATA:  70 year old female with headache, body ache, cough, nausea vomiting. Test for COVID-19. Pending. EXAM: PORTABLE CHEST 1 VIEW COMPARISON:  Chest radiographs 08/13/2020 and earlier. FINDINGS: Portable AP upright view at 1104 hours. Larger lung volumes. Mediastinal contours are stable and within normal limits. Cardiac loop recorder or superficial ICD is stable. Visualized tracheal air column is within normal limits. Mild lung base atelectasis. Otherwise when allowing for portable technique the lungs are clear. No acute osseous abnormality identified. IMPRESSION: Mild lung base atelectasis. No other acute cardiopulmonary abnormality. Electronically Signed   By: Odessa Fleming M.D.   On: 08/26/2020 11:32    Procedures Procedures  Medications Ordered in the ED Medications  sodium chloride 0.9 % bolus 1,000 mL (1,000 mLs Intravenous New Bag/Given 08/26/20 1051)  ondansetron (ZOFRAN) injection 4 mg (4 mg Intravenous Given 08/26/20 1052)     MDM Rules/Calculators/A&P MDM  ED Course  I have reviewed the triage vital signs and the nursing notes.  Pertinent labs & imaging results that were available during my care of the patient were reviewed by me and considered in my medical decision making (see chart for details).  Clinical Course as of 08/26/20 1353  Wed Aug 26, 2020  1043 Patient is requesting  repeat Covid testing.  [CS]  1123 CBC with mild anemia, otherwise unremarkable. CMP with mild hyponatremia, getting IVF.  [CS]  1203 Covid/Flu is confirmed negative.  [CS]  1203 CXR is negative.  [CS]  1307 UA is neg for signs of infection. [CS]  1344 Patient's labs and imaging are neg for acute process and no signs of significant bacterial infection. Plan discharge with Rx for tessalon, prednisone and continued symptomatic management at home.  [CS]    Clinical Course User Index [CS] Pollyann Savoy, MD    Final Clinical Impression(s) / ED Diagnoses Final diagnoses:  Viral URI with cough  Acute bronchitis, unspecified organism    Rx / DC Orders ED Discharge Orders         Ordered    predniSONE (STERAPRED UNI-PAK 21 TAB) 10 MG (21) TBPK tablet        08/26/20 1353    benzonatate (TESSALON) 100 MG capsule  Every 8 hours        08/26/20 1353           Pollyann Savoy, MD 08/26/20 1353

## 2020-08-28 NOTE — Progress Notes (Signed)
[facility] Summary Report / Loop Recorder 

## 2020-09-04 ENCOUNTER — Ambulatory Visit: Payer: PRIVATE HEALTH INSURANCE | Admitting: Allergy & Immunology

## 2020-09-08 DIAGNOSIS — W19XXXD Unspecified fall, subsequent encounter: Secondary | ICD-10-CM | POA: Diagnosis not present

## 2020-09-08 DIAGNOSIS — R0602 Shortness of breath: Secondary | ICD-10-CM | POA: Diagnosis not present

## 2020-09-08 DIAGNOSIS — R0789 Other chest pain: Secondary | ICD-10-CM | POA: Diagnosis not present

## 2020-09-08 DIAGNOSIS — J069 Acute upper respiratory infection, unspecified: Secondary | ICD-10-CM | POA: Diagnosis not present

## 2020-09-08 DIAGNOSIS — R5383 Other fatigue: Secondary | ICD-10-CM | POA: Diagnosis not present

## 2020-09-08 DIAGNOSIS — R911 Solitary pulmonary nodule: Secondary | ICD-10-CM | POA: Diagnosis not present

## 2020-09-08 DIAGNOSIS — M549 Dorsalgia, unspecified: Secondary | ICD-10-CM | POA: Diagnosis not present

## 2020-09-13 LAB — CUP PACEART REMOTE DEVICE CHECK
Date Time Interrogation Session: 20220602031554
Implantable Pulse Generator Implant Date: 20190129

## 2020-09-14 ENCOUNTER — Ambulatory Visit (INDEPENDENT_AMBULATORY_CARE_PROVIDER_SITE_OTHER): Payer: Medicare Other

## 2020-09-14 DIAGNOSIS — R55 Syncope and collapse: Secondary | ICD-10-CM

## 2020-09-15 ENCOUNTER — Other Ambulatory Visit: Payer: Self-pay | Admitting: Family Medicine

## 2020-09-15 ENCOUNTER — Inpatient Hospital Stay: Admission: RE | Admit: 2020-09-15 | Payer: Medicare Other | Source: Ambulatory Visit

## 2020-09-15 DIAGNOSIS — R0602 Shortness of breath: Secondary | ICD-10-CM

## 2020-09-18 ENCOUNTER — Other Ambulatory Visit: Payer: Self-pay | Admitting: Family Medicine

## 2020-09-18 ENCOUNTER — Other Ambulatory Visit (HOSPITAL_COMMUNITY): Payer: Self-pay | Admitting: Family Medicine

## 2020-09-18 DIAGNOSIS — R059 Cough, unspecified: Secondary | ICD-10-CM

## 2020-09-18 DIAGNOSIS — R0602 Shortness of breath: Secondary | ICD-10-CM

## 2020-09-22 ENCOUNTER — Ambulatory Visit (INDEPENDENT_AMBULATORY_CARE_PROVIDER_SITE_OTHER): Payer: Medicare Other

## 2020-09-22 ENCOUNTER — Other Ambulatory Visit: Payer: Self-pay

## 2020-09-22 DIAGNOSIS — R911 Solitary pulmonary nodule: Secondary | ICD-10-CM

## 2020-09-22 DIAGNOSIS — J9811 Atelectasis: Secondary | ICD-10-CM | POA: Diagnosis not present

## 2020-09-22 DIAGNOSIS — R0602 Shortness of breath: Secondary | ICD-10-CM

## 2020-09-22 DIAGNOSIS — K449 Diaphragmatic hernia without obstruction or gangrene: Secondary | ICD-10-CM | POA: Diagnosis not present

## 2020-09-22 DIAGNOSIS — I700 Atherosclerosis of aorta: Secondary | ICD-10-CM | POA: Diagnosis not present

## 2020-09-22 DIAGNOSIS — R059 Cough, unspecified: Secondary | ICD-10-CM

## 2020-10-05 NOTE — Progress Notes (Signed)
[facility] Summary Report / Loop Recorder 

## 2020-10-07 ENCOUNTER — Ambulatory Visit: Payer: PRIVATE HEALTH INSURANCE | Admitting: Allergy & Immunology

## 2020-10-18 LAB — CUP PACEART REMOTE DEVICE CHECK
Date Time Interrogation Session: 20220705031656
Implantable Pulse Generator Implant Date: 20190129

## 2020-10-19 ENCOUNTER — Ambulatory Visit (INDEPENDENT_AMBULATORY_CARE_PROVIDER_SITE_OTHER): Payer: Medicare Other

## 2020-10-19 DIAGNOSIS — R55 Syncope and collapse: Secondary | ICD-10-CM | POA: Diagnosis not present

## 2020-10-28 ENCOUNTER — Telehealth: Payer: Self-pay | Admitting: Internal Medicine

## 2020-10-28 DIAGNOSIS — R6883 Chills (without fever): Secondary | ICD-10-CM | POA: Diagnosis not present

## 2020-10-28 DIAGNOSIS — E039 Hypothyroidism, unspecified: Secondary | ICD-10-CM | POA: Diagnosis not present

## 2020-10-28 NOTE — Telephone Encounter (Signed)
STAT if patient feels like he/she is going to faint   Are you dizzy now? no  Do you feel faint or have you passed out? no  Do you have any other symptoms? Gets hot and cold, nauseas, clammy, and shaky during lightheaded episodes  Have you checked your HR and BP (record if available)? 90's low 100's during episodes   Patient states she has had 4 very intense episodes of lightheadedness, where she feels like she will pass out. She states it only happens when she goes to lay down before bed. She says she starts to feel hot and cold, gets a very intense wave of dizziness. She says she also get shaky and her teeth start chattering. She states it is so intense she feels like she will die. She says it has been happening about once a week and started about 4 weeks ago. She says her oxygen has been okay, but during her episodes her HR goes up to the 90's and 100's. She says her monthly report for her device also said she had 11 episodes of SVT, which is more than normal. She also says she had human metapneumovirus virus in May and was very sick. She says her PCP told her to call the office.

## 2020-10-29 NOTE — Telephone Encounter (Signed)
Left detailed message requesting call back. 

## 2020-11-02 NOTE — Telephone Encounter (Signed)
Sent mychart message

## 2020-11-04 NOTE — Telephone Encounter (Signed)
Call placed to pt to offer her an appointment with one of our EP APP's, left a message for her to call back.

## 2020-11-04 NOTE — Telephone Encounter (Signed)
Attempted to contact Pt x 3 with no response.  Await further needs.

## 2020-11-11 NOTE — Progress Notes (Signed)
[facility] Summary Report / Loop Recorder 

## 2020-11-20 DIAGNOSIS — Z0000 Encounter for general adult medical examination without abnormal findings: Secondary | ICD-10-CM | POA: Diagnosis not present

## 2020-11-23 ENCOUNTER — Ambulatory Visit (INDEPENDENT_AMBULATORY_CARE_PROVIDER_SITE_OTHER): Payer: Medicare Other

## 2020-11-23 DIAGNOSIS — R55 Syncope and collapse: Secondary | ICD-10-CM

## 2020-11-25 LAB — CUP PACEART REMOTE DEVICE CHECK
Date Time Interrogation Session: 20220814231108
Implantable Pulse Generator Implant Date: 20190129

## 2020-11-30 DIAGNOSIS — L820 Inflamed seborrheic keratosis: Secondary | ICD-10-CM | POA: Diagnosis not present

## 2020-12-11 NOTE — Progress Notes (Signed)
[facility] Summary Report / Loop Recorder 

## 2020-12-15 ENCOUNTER — Telehealth: Payer: Self-pay

## 2020-12-15 NOTE — Telephone Encounter (Signed)
ILR alert report received. Battery status OK. Normal device function. No new symptom, brady, or pause episodes. No new AF episodes. There were two tachy episodes, the one viewed episode was a narrow complex rhythm at 176 bpm for 26 seconds, sent to triage.     ILR was implanted on 04/2017 Seen in office by Dr. Ladona Ridgel on 07/07/20 (7 SVTR event noted with duration of few minutes.) Today~ 57 events logged.  Attempted to contact patient to assess and check mediation compliance. No answer, LMTCB.

## 2020-12-17 NOTE — Telephone Encounter (Signed)
Second unsuccessful telephone encounter to follow up on med compliance and s/s of tachycardia noted on most recent loop recorder report. [employer] compliant VM message left requesting call back to [phone].

## 2020-12-18 NOTE — Progress Notes (Signed)
Certified letter sent 

## 2020-12-18 NOTE — Telephone Encounter (Signed)
3rd attempt to contact patient as well as husband Maisie Fus who is on [place]. LMTCB on patients cell phone. No option to leave VM on husband.   Certified letter sent.

## 2020-12-23 ENCOUNTER — Ambulatory Visit: Payer: PRIVATE HEALTH INSURANCE | Admitting: Allergy & Immunology

## 2020-12-25 NOTE — Telephone Encounter (Signed)
Attempted to contact patient. No answer,LMTCB 

## 2020-12-25 NOTE — Telephone Encounter (Signed)
Patient called stating she has gotten the certified letter. I let patient know a nurse will return call

## 2020-12-28 ENCOUNTER — Ambulatory Visit (INDEPENDENT_AMBULATORY_CARE_PROVIDER_SITE_OTHER): Payer: Medicare Other

## 2020-12-28 DIAGNOSIS — R55 Syncope and collapse: Secondary | ICD-10-CM

## 2020-12-28 NOTE — Telephone Encounter (Signed)
SVT noted. Watchful waiting.

## 2020-12-28 NOTE — Telephone Encounter (Signed)
Attempted to contact patient, phone goes straight to VM.

## 2020-12-28 NOTE — Telephone Encounter (Signed)
The patient returned nurse phone call.

## 2020-12-28 NOTE — Telephone Encounter (Signed)
Patient returning phone call.   Patient recalls she was out of town visiting family (riding in the car), no strenuous activity. States she did not feel well experienced palpitations but no other specific symptoms. Reports compliance with medications including Toprol- XL 12.5 mg daily.  Advised I will forward to Dr. Ladona Ridgel for review and recommendation.

## 2020-12-29 LAB — CUP PACEART REMOTE DEVICE CHECK
Date Time Interrogation Session: 20220916231003
Implantable Pulse Generator Implant Date: 20190129

## 2020-12-30 NOTE — Telephone Encounter (Signed)
Patient is following up, requesting Dr. Lubertha Basque recommendation. She states she also has a dental cleaning coming up on 01/04/21 and she will have the dentist office contact our office to request clearance due to ILR. She states she has been having issues with her phone and she requested that her call be returned to her husband's phone at [phone].

## 2020-12-30 NOTE — Telephone Encounter (Signed)
Patient called and updated as plan of care. Also, advised to call back if any symptoms arise. Appreciative of follow up call.

## 2021-01-01 NOTE — Progress Notes (Signed)
[facility] Summary Report / Loop Recorder 

## 2021-01-05 ENCOUNTER — Telehealth: Payer: Self-pay

## 2021-01-05 NOTE — Telephone Encounter (Signed)
LINQ alert received.  Device has reached RRT 9/26  Attempted to contact patient to advise. No answer, LMTCB.

## 2021-01-12 ENCOUNTER — Other Ambulatory Visit: Payer: Self-pay

## 2021-01-12 ENCOUNTER — Emergency Department (HOSPITAL_BASED_OUTPATIENT_CLINIC_OR_DEPARTMENT_OTHER): Payer: Medicare Other

## 2021-01-12 ENCOUNTER — Emergency Department (HOSPITAL_BASED_OUTPATIENT_CLINIC_OR_DEPARTMENT_OTHER)
Admission: EM | Admit: 2021-01-12 | Discharge: 2021-01-12 | Disposition: A | Discharge status: Home / Self Care | Payer: Medicare Other | Attending: Emergency Medicine | Admitting: Emergency Medicine

## 2021-01-12 ENCOUNTER — Encounter (HOSPITAL_BASED_OUTPATIENT_CLINIC_OR_DEPARTMENT_OTHER): Payer: Self-pay | Admitting: Emergency Medicine

## 2021-01-12 DIAGNOSIS — R220 Localized swelling, mass and lump, head: Secondary | ICD-10-CM | POA: Diagnosis not present

## 2021-01-12 DIAGNOSIS — E039 Hypothyroidism, unspecified: Secondary | ICD-10-CM | POA: Insufficient documentation

## 2021-01-12 DIAGNOSIS — Z79899 Other long term (current) drug therapy: Secondary | ICD-10-CM | POA: Insufficient documentation

## 2021-01-12 DIAGNOSIS — R519 Headache, unspecified: Secondary | ICD-10-CM | POA: Diagnosis present

## 2021-01-12 DIAGNOSIS — I10 Essential (primary) hypertension: Secondary | ICD-10-CM | POA: Diagnosis not present

## 2021-01-12 DIAGNOSIS — Z20822 Contact with and (suspected) exposure to covid-19: Secondary | ICD-10-CM | POA: Insufficient documentation

## 2021-01-12 DIAGNOSIS — G43809 Other migraine, not intractable, without status migrainosus: Secondary | ICD-10-CM | POA: Insufficient documentation

## 2021-01-12 LAB — RESP PANEL BY RT-PCR (FLU A&B, COVID) ARPGX2
Influenza A by PCR: NEGATIVE
Influenza B by PCR: NEGATIVE
SARS Coronavirus 2 by RT PCR: NEGATIVE

## 2021-01-12 MED ORDER — PROCHLORPERAZINE EDISYLATE 10 MG/2ML IJ SOLN
10.0000 mg | Freq: Once | INTRAMUSCULAR | Status: AC
  Administered 2021-01-12: 10 mg via INTRAVENOUS
  Filled 2021-01-12: qty 2

## 2021-01-12 MED ORDER — METOCLOPRAMIDE HCL 5 MG/ML IJ SOLN
10.0000 mg | Freq: Once | INTRAMUSCULAR | Status: AC
  Administered 2021-01-12: 10 mg via INTRAVENOUS
  Filled 2021-01-12: qty 2

## 2021-01-12 MED ORDER — KETOROLAC TROMETHAMINE 15 MG/ML IJ SOLN
15.0000 mg | Freq: Once | INTRAMUSCULAR | Status: AC
  Administered 2021-01-12: 15 mg via INTRAVENOUS
  Filled 2021-01-12: qty 1

## 2021-01-12 MED ORDER — SODIUM CHLORIDE 0.9 % IV BOLUS
1000.0000 mL | Freq: Once | INTRAVENOUS | Status: AC
  Administered 2021-01-12: 1000 mL via INTRAVENOUS

## 2021-01-12 MED ORDER — SODIUM CHLORIDE 0.9 % IV SOLN: INTRAVENOUS | Status: DC

## 2021-01-12 MED ORDER — DIPHENHYDRAMINE HCL 50 MG/ML IJ SOLN
25.0000 mg | Freq: Once | INTRAMUSCULAR | Status: AC
  Administered 2021-01-12: 25 mg via INTRAVENOUS
  Filled 2021-01-12: qty 1

## 2021-01-12 MED ORDER — ACETAMINOPHEN 500 MG PO TABS
1000.0000 mg | ORAL_TABLET | Freq: Once | ORAL | Status: AC
  Administered 2021-01-12: 1000 mg via ORAL
  Filled 2021-01-12: qty 2

## 2021-01-12 NOTE — Plan of Care (Signed)
Neurology asked to comment on patient with right facial numbness starting 11 PM on 10/3 in the context of headache w/ history of complex migraine  Last seen by inpatient neurology in 10/2017 for sudden onset of HA and right facial tingling. Per notes at the time "Back in 2015 patient had right sided weakness and that was determined to be due to a migraine. Later that year (01/2014) she was seen for right facial numbness with no weakness, also diagnosed as a  migraine."  Stroke risk factors include  HTN, HLD, obesity (BMI 31.14), complex migraines, SVT, age   Given age and risk factors, if symptoms have not resolved with migraine treatment given in ED or patient feels symptoms are substantially different than prior, reasonable to exclude acute stroke with MRI brain w/o contrast, if MRI negative or patient can confirm this is her typical presentation of complex migraine and she is comfortable following up outpatient, continued outpatient follow-up is appropriate from neurological perspective. Full medical clearance per ED.  Brooke Dare MD-PhD [facility] [phone] Available 7 AM to 7 PM, outside these hours please contact Neurologist on call listed on AMION   Brief curbside recommendations only, at ED request

## 2021-01-12 NOTE — ED Provider Notes (Signed)
[facility] EMERGENCY DEPT Provider Note   CSN: [id number] Arrival date & time: 01/12/21  0941     History Chief Complaint  Patient presents with   Numbness    Kyiesha Millward is a 70 y.o. female.  HPI  70 year old female presenting to the emergency department with a complaint of headache and right-sided facial numbness.  The patient states that she was last normal at 11 PM last night.  She developed a posterior headache last night.  She has a history of migraine headaches with associated aura.  She endorses nausea, light sensitivity, persistent right-sided facial numbness around her lips and her eyes.  She endorses a sick contact in her grandson.  She was at her dentist office earlier this morning and was told to present to the emergency department due to possible Bell's palsy.  Her headache is constant, dull and throbbing, moderate in severity.  Past Medical History:  Diagnosis Date   Diverticulitis    Eosinophilic esophagitis    Fibromyalgia    GERD (gastroesophageal reflux disease)    Hyperlipidemia    Hypothyroidism    IBS (irritable bowel syndrome)    Migraines    Mitral regurgitation 02/28/2014   Mild to moderate by echo 02/2014   Obesity    Osteoarthritis    Otitis externa    Palpitations    TIA (transient ischemic attack)     Patient Active Problem List   Diagnosis Date Noted   Malnutrition of moderate degree 03/09/2016   Syncope 03/08/2016   Sensory disturbance 03/08/2016   Hypothyroidism 03/08/2016   Urticaria with associated angioedema 04/27/2015   Angioedema 04/27/2015   Allergic reaction 01/07/2015   Food allergy 01/07/2015   Mitral regurgitation 02/28/2014   MVP (mitral valve prolapse) 02/24/2014   SOB (shortness of breath) 02/24/2014   Snoring 02/24/2014   Hypertension    Hyperlipidemia    Chest pain 02/10/2014    Past Surgical History:  Procedure Laterality Date   ABDOMINAL HYSTERECTOMY     carpel tunnel     lbp gso     LOOP  RECORDER INSERTION N/A 05/08/2017   Procedure: LOOP RECORDER INSERTION;  Surgeon: Marinus Maw, MD;  Location: [facility] INVASIVE CV LAB;  Service: Cardiovascular;  Laterality: N/A;   TONSILLECTOMY AND ADENOIDECTOMY       OB History   No obstetric history on file.     Family History  Problem Relation Age of Onset   Allergic rhinitis Mother    Urticaria Mother    Allergic rhinitis Father    Asthma Father    Allergic rhinitis Sister    Angioedema Sister    Urticaria Sister    Allergic rhinitis Brother    Asthma Brother     Social History   Tobacco Use   Smoking status: Never   Smokeless tobacco: Never  Vaping Use   Vaping Use: Never used  Substance Use Topics   Alcohol use: Not Currently    Alcohol/week: 1.0 standard drink    Types: 1 Glasses of wine per week   Drug use: No    Home Medications Prior to Admission medications   Medication Sig Start Date End Date Taking? Authorizing Provider  acetaminophen (TYLENOL) 500 MG tablet Take 1,000 mg by mouth 2 (two) times daily as needed (migraine headaches).    [provider]  benzonatate (TESSALON) 100 MG capsule Take 1 capsule (100 mg total) by mouth every 8 (eight) hours. 08/26/20   Pollyann Savoy, MD  diphenhydrAMINE (BENADRYL) 12.5  MG chewable tablet Chew 25 mg by mouth 2 (two) times daily as needed (allergic reaction).    [provider]  diphenhydrAMINE (BENADRYL) 2 % cream Apply 1 application topically daily as needed for itching (allergic reactions).    [provider]  EPINEPHrine 0.3 mg/0.3 mL IJ SOAJ injection Inject 0.3 mLs (0.3 mg total) into the muscle once. Patient taking differently: Inject 0.3 mg into the muscle once as needed for anaphylaxis (severe allergic reaction). 04/27/15   Bobbitt, Heywood Iles, MD  Eyelid Cleansers [facility] STERILID CLEANSER) SOLN Place 1 application into both eyes 2 (two) times daily as needed (to cleanse eyes).    [provider]   HYDROcodone-acetaminophen (NORCO/VICODIN) 5-325 MG tablet Take 1 tablet by mouth every 6 (six) hours as needed for severe pain. 08/13/20   Pollyann Savoy, MD  levothyroxine (SYNTHROID) 88 MCG tablet Take 88 mcg by mouth daily before breakfast.    [provider]  metoprolol succinate (TOPROL-XL) 25 MG 24 hr tablet Take 12.5 mg by mouth daily.    [provider]  ondansetron (ZOFRAN-ODT) 4 MG disintegrating tablet Take 1 tablet (4 mg total) by mouth every 8 (eight) hours as needed for nausea or vomiting. 08/24/20   Georgetta Haber, NP  predniSONE (STERAPRED UNI-PAK 21 TAB) 10 MG (21) TBPK tablet 10mg  Tabs, 6 day taper. Use as directed 08/26/20   08/28/20, MD  vitamin B-12 (CYANOCOBALAMIN) 1000 MCG tablet Take 1,000 mcg by mouth daily.    [provider]    Allergies    Azithromycin, Codeine, Ivp dye [iodinated diagnostic agents], Natamycin, Other, Rose, Shellfish allergy, Thimerosol [thimerosal], Strawberry extract, Adhesive [tape], Avelox [moxifloxacin hcl in nacl], Bacitracin, Cinnamon, Doxycycline, Ginger, Neomycin, Onion, Sunscreens [aminobenzoate], Watermelon [citrullus vulgaris], Benzalkonium chloride, Cephalosporins, and Penicillins  Review of Systems   Review of Systems  Constitutional:  Negative for chills and fever.  HENT:  Negative for ear pain and sore throat.   Eyes:  Negative for pain and visual disturbance.  Respiratory:  Negative for cough and shortness of breath.   Cardiovascular:  Negative for chest pain and palpitations.  Gastrointestinal:  Negative for abdominal pain and vomiting.  Genitourinary:  Negative for dysuria and hematuria.  Musculoskeletal:  Negative for arthralgias and back pain.  Skin:  Negative for color change and rash.  Neurological:  Positive for numbness and headaches. Negative for seizures and syncope.  All other systems reviewed and are negative.  Physical Exam Updated Vital Signs BP 116/72 (BP Location: Left  Arm)   Pulse 63   Temp 98 F (36.7 C) (Oral)   Resp 15   Ht 5' 5.5" (1.664 m)   Wt 86.2 kg   SpO2 100%   BMI 31.14 kg/m   Physical Exam Vitals and nursing note reviewed.  Constitutional:      General: She is not in acute distress.    Appearance: She is well-developed.  HENT:     Head: Normocephalic and atraumatic.  Eyes:     Conjunctiva/sclera: Conjunctivae normal.  Cardiovascular:     Rate and Rhythm: Normal rate and regular rhythm.     Heart sounds: No murmur heard. Pulmonary:     Effort: Pulmonary effort is normal. No respiratory distress.     Breath sounds: Normal breath sounds.  Abdominal:     Palpations: Abdomen is soft.     Tenderness: There is no abdominal tenderness.  Musculoskeletal:     Cervical back: Neck supple.  Skin:    General:  Skin is warm and dry.  Neurological:     Mental Status: She is alert.     Comments: MENTAL STATUS EXAM:    Orientation: Alert and oriented to person, place and time.  Memory: Cooperative, follows commands well.  Language: Speech is clear and language is normal.   CRANIAL NERVES:    CN 2 (Optic): Visual fields intact to confrontation.  CN 3,4,6 (EOM): Pupils equal and reactive to light. Full extraocular eye movement without nystagmus.  CN 5 (Trigeminal): Facial sensation is normal, no weakness of masticatory muscles.  CN 7 (Facial): No facial weakness or asymmetry.  CN 8 (Auditory): Auditory acuity grossly normal.  CN 9,10 (Glossophar): The uvula is midline, the palate elevates symmetrically.  CN 11 (spinal access): Normal sternocleidomastoid and trapezius strength.  CN 12 (Hypoglossal): The tongue is midline. No atrophy or fasciculations.Marland Kitchen   MOTOR:  Muscle Strength: 5/5RUE, 5/5LUE, 5/5RLE, 5/5LLE.   COORDINATION:   Intact finger-to-nose, no tremor, no pronator drift.   SENSATION:   Intact to light touch all four extremities.  GAIT: Gait normal without ataxia     ED Results / Procedures / Treatments   Labs (all labs  ordered are listed, but only abnormal results are displayed) Labs Reviewed  RESP PANEL BY RT-PCR (FLU A&B, COVID) [id number]    EKG EKG Interpretation  Date/Time:  Tuesday January 12 2021 09:51:14 EDT Ventricular Rate:  75 PR Interval:    QRS Duration: 76 QT Interval:  376 QTC Calculation: 419 R Axis:   7 Text Interpretation: Accelerated Junctional rhythm Low voltage QRS Nonspecific ST abnormality Abnormal ECG Confirmed by Ernie Avena ([id number]) on 01/12/2021 3:21:50 PM  Radiology CT Head Wo Contrast  Result Date: 01/12/2021 CLINICAL DATA:  Right-sided facial numbness starting yesterday at 11 p.m. EXAM: CT HEAD WITHOUT CONTRAST TECHNIQUE: Contiguous axial images were obtained from the base of the skull through the vertex without intravenous contrast. COMPARISON:  CT head 08/13/2020, brain MRI 04/05/2020 FINDINGS: Brain: There is no evidence of acute intracranial hemorrhage, extra-axial fluid collection, or acute infarct. The ventricles are normal in size. There is no mass lesion. There is no midline shift. 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 Vascular: There is calcification of the bilateral cavernous ICAs. Skull: Normal. Negative for fracture or focal lesion. Sinuses/Orbits: The imaged paranasal sinuses are clear. The imaged globes and orbits are unremarkable. Other: None. IMPRESSION: No acute intracranial pathology. Electronically Signed   By: Lesia Hausen M.D.   On: 01/12/2021 11:12    Procedures Procedures   Medications Ordered in ED Medications  sodium chloride 0.9 % bolus 1,000 mL (0 mLs Intravenous Stopped 01/12/21 1453)  metoCLOPramide (REGLAN) injection 10 mg (10 mg Intravenous Given 01/12/21 1035)  diphenhydrAMINE (BENADRYL) injection 25 mg (25 mg Intravenous Given 01/12/21 1035)  acetaminophen (TYLENOL) tablet 1,000 mg (1,000 mg Oral Given 01/12/21 1038)  prochlorperazine (COMPAZINE) injection 10 mg (10 mg Intravenous Given 01/12/21 1241)  ketorolac (TORADOL) 15 MG/ML injection 15  mg (15 mg Intravenous Given 01/12/21 1240)    ED Course  I have reviewed the triage vital signs and the nursing notes.  Pertinent labs & imaging results that were available during my care of the patient were reviewed by me and considered in my medical decision making (see chart for details).    MDM Rules/Calculators/A&P                           70 year old female presenting to the emergency department  with a complaint of headache and right-sided facial numbness.  The patient states that she was last normal at 11 PM last night.  She developed a posterior headache last night.  She has a history of migraine headaches with associated aura.  She endorses nausea, light sensitivity, persistent right-sided facial numbness around her lips and her eyes.  She endorses a sick contact in her grandson.  She was at her dentist office earlier this morning and was told to present to the emergency department due to possible Bell's palsy.  Her headache is constant, dull and throbbing, moderate in severity.  On arrival, the patient continued to endorse headache with migraine like symptoms with photophobia, nausea, dull throbbing component.  Her neurologic exam on arrival was reassuring.  No evidence of facial droop or asymmetry.  Low concern for Bell's palsy at the time with no evidence for facial paralysis.  Lower concern for acute CVA at this time.  Patient does endorse subjective numbness but sensation was intact to light touch bilaterally.  No other motor or sensory deficit noted.  CT of the head was performed which revealed no acute intracranial abnormality.  COVID-19 and influenza PCR testing resulted normal.  Vitals on arrival unremarkable and stable.  Discussed the patient's case with Dr. Iver Nestle of neurology.  She does have a history of migraine headache with associated right-sided facial paresthesias and numbness.  She has had 3 prior episodes of this which were ultimately diagnosed by neurology to be complex  migraines.  Neurology recommends offering the patient MRI imaging to exclude stroke if patient feels symptoms are substantially different than prior.  On repeat exam patient continues to have symptoms.  Headache and numbness have improved somewhat with migraine cocktail.  She states that her migraines can last for days sometimes.  I explained to her my discussion with our neurology colleagues given her history of prior complex migraine headache, feel symptoms are consistent with likely complex migraine. Patient has known contrast dye allergy. Risks and benefits of transfer for MRI imaging discussed. After discussion with the patient, will hold on further transfer and workup for possible CVA.  Patient will plan for continued symptomatic management outpatient with plan for follow-up with neurology in clinic.  Overall stable for discharge.  Final Clinical Impression(s) / ED Diagnoses Final diagnoses:  Other migraine without status migrainosus, not intractable    Rx / DC Orders ED Discharge Orders          Ordered    Ambulatory referral to Neurology       Comments: An appointment is requested in approximately: 4 weeks   01/12/21 1408             Ernie Avena, MD 01/13/21 [phone]

## 2021-01-12 NOTE — ED Triage Notes (Signed)
Pt arrives to ED with c/o of right sided facial numbness. This started yesterday at 11pm. Pt went to bed and her face was still numb when she woke up. She reports she developed a posterior headache when the numbness started. The headache is constant and dull. She has nausea but no vomiting. She is having sensitivity to light in both eyes. Hx of migraines. States she's been around her grandson who's been sick for 10 days.

## 2021-01-12 NOTE — Telephone Encounter (Signed)
The patient states she is at the ER and will call back to see what she needs to do about her loop being at [facility].

## 2021-01-12 NOTE — Discharge Instructions (Addendum)
Your symptoms are consistent with complex migraine for which you have presented previously to the emergency department. We discussed the risk for possible stroke versus migraine headache as etiology of your presentation.  After discussion of the risks and benefits of transfer to [facility] for MRI imaging and after discussion with neurology, we will go ahead and hold on transfer for now and have you follow-up with neuro in clinic.

## 2021-01-14 NOTE — Telephone Encounter (Signed)
Spoke with pt regarding ILR at [facility].  Advised to disconnect monitor as future checks have been cancelled.  Pt would like to have device removed.  Advised a scheduler will contact her to set up appt with Dr. Ladona Ridgel for removal of device.

## 2021-01-17 ENCOUNTER — Other Ambulatory Visit: Payer: Self-pay | Admitting: Internal Medicine

## 2021-01-18 NOTE — Telephone Encounter (Signed)
This is a [place] pt.  °

## 2021-01-19 NOTE — Telephone Encounter (Signed)
The patient called to reschedule her loop removal for January to October. I let her speak with [place].

## 2021-02-02 DIAGNOSIS — H35371 Puckering of macula, right eye: Secondary | ICD-10-CM | POA: Diagnosis not present

## 2021-02-02 DIAGNOSIS — H5213 Myopia, bilateral: Secondary | ICD-10-CM | POA: Diagnosis not present

## 2021-02-02 DIAGNOSIS — H43811 Vitreous degeneration, right eye: Secondary | ICD-10-CM | POA: Diagnosis not present

## 2021-02-16 ENCOUNTER — Telehealth: Payer: Self-pay | Admitting: Internal Medicine

## 2021-02-16 NOTE — Telephone Encounter (Signed)
Advised Pt that we only use lidocaine in office.  Do not use epinephrine.

## 2021-02-16 NOTE — Telephone Encounter (Signed)
New Message:      Patient would like to talk to Dr Lubertha Basque nurse. She is having her Loop Recorder removed on 02-18-21. She is very concerned, because the last time they used  Epinephrine and it triggered her SVT.

## 2021-02-18 ENCOUNTER — Other Ambulatory Visit: Payer: Self-pay

## 2021-02-18 ENCOUNTER — Ambulatory Visit: Payer: Medicare Other | Admitting: Internal Medicine

## 2021-02-18 VITALS — BP 126/70 | HR 73 | Ht 65.5 in | Wt 197.4 lb

## 2021-02-18 DIAGNOSIS — I639 Cerebral infarction, unspecified: Secondary | ICD-10-CM | POA: Insufficient documentation

## 2021-02-18 DIAGNOSIS — R55 Syncope and collapse: Secondary | ICD-10-CM

## 2021-02-18 DIAGNOSIS — I471 Supraventricular tachycardia: Secondary | ICD-10-CM

## 2021-02-18 NOTE — Progress Notes (Signed)
HPI Mrs. Zogg returns today for ongoing evaluation and management of her SVT. She has a remote h/o cryptogenic stroke and is s/p ILR insertion. She has episodes of SVT which she can usually terminate with vagal maneuvers. She has had problems with sob with exertion. She denies dietary indiscretion. No edema. No syncope. Her SVT has been stable and controlled. She would like to have her ILR removed. Allergies  Allergen Reactions   Azithromycin Anaphylaxis   Codeine Shortness Of Breath   Ivp Dye [Iodinated Diagnostic Agents] Anaphylaxis   Natamycin Other (See Comments)    Throat Swelling   Other Anaphylaxis, Hives, Swelling and Other (See Comments)    Irregular heart beat Triple Antibiotic Ointment Nuts - Anaphylaxis  Berries - itching and swelling    Rose Hives   Shellfish Allergy Anaphylaxis   Thimerosol [Thimerosal] Anaphylaxis   Strawberry Extract Swelling    Pt reports throat swelling and itching with any type of berry   Adhesive [Tape] Hives    Paper tape is ok   Avelox [Moxifloxacin Hcl In Nacl] Other (See Comments)    Irregular heart beat   Bacitracin Hives and Itching   Cinnamon Hives   Doxycycline Hives and Swelling    Facial swelling   Ginger Hives and Swelling   Neomycin Hives   Onion Swelling    MOUTH SWELLING   Sunscreens [Aminobenzoate] Hives and Swelling    Avobenzene and Oxybenzene   Watermelon [Citrullus Vulgaris]     Tongue swelling    Benzalkonium Chloride Rash   Cephalosporins Hives and Rash   Penicillins Other (See Comments)    Childhood allergy Has patient had a PCN reaction causing immediate rash, facial/tongue/throat swelling, SOB or lightheadedness with hypotension: Unknown Has patient had a PCN reaction causing severe rash involving mucus membranes or skin necrosis: Unknown Has patient had a PCN reaction that required hospitalization: No Has patient had a PCN reaction occurring within the last 10 years: No If all of the above answers  are "NO", then may proceed with Cephalosporin use.      Current Outpatient Medications  Medication Sig Dispense Refill   acetaminophen (TYLENOL) 500 MG tablet Take 1,000 mg by mouth 2 (two) times daily as needed (migraine headaches).     diphenhydrAMINE (BENADRYL) 12.5 MG chewable tablet Chew 25 mg by mouth 2 (two) times daily as needed (allergic reaction).     diphenhydrAMINE (BENADRYL) 2 % cream Apply 1 application topically daily as needed for itching (allergic reactions).     EPINEPHrine 0.3 mg/0.3 mL IJ SOAJ injection Inject 0.3 mLs (0.3 mg total) into the muscle once. (Patient taking differently: Inject 0.3 mg into the muscle once as needed for anaphylaxis (severe allergic reaction).) 2 Device 2   Eyelid Cleansers (THERATEARS STERILID CLEANSER) SOLN Place 1 application into both eyes 2 (two) times daily as needed (to cleanse eyes).     levothyroxine (SYNTHROID) 88 MCG tablet Take 88 mcg by mouth daily before breakfast.     metoprolol succinate (TOPROL-XL) 25 MG 24 hr tablet TAKE 1/2 TABLET (12.5 MG TOTAL) BY MOUTH 2 (TWO) TIMES DAILY. 90 tablet 3   ondansetron (ZOFRAN-ODT) 4 MG disintegrating tablet Take 1 tablet (4 mg total) by mouth every 8 (eight) hours as needed for nausea or vomiting. 12 tablet 0   benzonatate (TESSALON) 100 MG capsule Take 1 capsule (100 mg total) by mouth every 8 (eight) hours. (Patient not taking: Reported on 02/18/2021) 21 capsule 0   HYDROcodone-acetaminophen (NORCO/VICODIN) 5-325  MG tablet Take 1 tablet by mouth every 6 (six) hours as needed for severe pain. (Patient not taking: Reported on 02/18/2021) 12 tablet 0   predniSONE (STERAPRED UNI-PAK 21 TAB) 10 MG (21) TBPK tablet 10mg  Tabs, 6 day taper. Use as directed (Patient not taking: Reported on 02/18/2021) 1 each 0   No current facility-administered medications for this visit.     Past Medical History:  Diagnosis Date   Diverticulitis    Eosinophilic esophagitis    Fibromyalgia    GERD  (gastroesophageal reflux disease)    Hyperlipidemia    Hypothyroidism    IBS (irritable bowel syndrome)    Migraines    Mitral regurgitation 02/28/2014   Mild to moderate by echo 02/2014   Obesity    Osteoarthritis    Otitis externa    Palpitations    TIA (transient ischemic attack)     ROS:   All systems reviewed and negative except as noted in the HPI.   Past Surgical History:  Procedure Laterality Date   ABDOMINAL HYSTERECTOMY     carpel tunnel     lbp gso     LOOP RECORDER INSERTION N/A 05/08/2017   Procedure: LOOP RECORDER INSERTION;  Surgeon: 05/10/2017, MD;  Location: [facility] INV[facility] LAB;  Service: Cardiovascular;  Laterality: N/A;   TONSILLECTOMY AND ADENOIDECTOMY       Family History  Problem Relation Age of Onset   Allergic rhinitis Mother    Urticaria Mother    Allergic rhinitis Father    Asthma Father    Allergic rhinitis Sister    Angioedema Sister    Urticaria Sister    Allergic rhinitis Brother    Asthma Brother      Social History   Socioeconomic History   Marital status: Married    Spouse name: Not on file   Number of children: Not on file   Years of education: Not on file   Highest education level: Not on file  Occupational History   Not on file  Tobacco Use   Smoking status: Never   Smokeless tobacco: Never  Vaping Use   Vaping Use: Never used  Substance and Sexual Activity   Alcohol use: Not Currently    Alcohol/week: 1.0 standard drink    Types: 1 Glasses of wine per week   Drug use: No   Sexual activity: Yes  Other Topics Concern   Not on file  Social History Narrative   Not on file   Social Determinants of Health   Financial Resource Strain: Not on file  Food Insecurity: Not on file  Transportation Needs: Not on file  Physical Activity: Not on file  Stress: Not on file  Social Connections: Not on file  Intimate Partner Violence: Not on file     BP 126/70   Pulse 73   Ht 5' 5.5" (1.664 m)   Wt 197 lb 6.4 oz  (89.5 kg)   SpO2 99%   BMI 32.35 kg/m   Physical Exam:  Well appearing NAD HEENT: Unremarkable Neck:  No JVD, no thyromegally Lymphatics:  No adenopathy Back:  No CVA tenderness Lungs:  Clear with no wheezes HEART:  Regular rate rhythm, no murmurs, no rubs, no clicks Abd:  soft, positive bowel sounds, no organomegally, no rebound, no guarding Ext:  2 plus pulses, no edema, no cyanosis, no clubbing Skin:  No rashes no nodules Neuro:  CN II through XII intact, motor grossly intact  DEVICE  Normal device function.  See PaceArt for details.   EP Procedure Note  Preoperative diagnosis: cryptogenic stroke   Postoperative diagnosis: same as preoperative diagnosis  Procedure Performed: ILR removal  Description of the procedure: after informed consent was obtained, the patient was prepped and draped in a sterile fashion. 10 cc of lidocaine was infiltrated. A one cm stab incision was carried out. A combination of blunt and sharp dissection was carried out. The ILR was grasped and the scar tissue freed up and the ILR removed with gentle traction. Benzoin and steristrips were painted on the skin. A bandage applied and the patient recovered in the usual manner.  Complications: none immediately  Conclusion: successful ILR removal.  Assess/Plan:  Cryptogenic stroke - she has not had any atrial fib on her ILR.  ILR - her device has been removed. SVT - she is well controlled. If she has worsening I am glad to see her back for additional evaluation.  Carleene Overlie Khari Lett,MD

## 2021-02-18 NOTE — Patient Instructions (Addendum)
Medication Instructions:  Your physician recommends that you continue on your current medications as directed. Please refer to the Current Medication list given to you today.  Labwork: None ordered.  Testing/Procedures: None ordered.  Follow-Up:  Your physician wants you to follow-up in: as needed with Dr. Court Joy will receive a reminder letter in the mail two months in advance. If you don't receive a letter, please call our office to schedule the follow-up appointment.    Implantable Loop Recorder Removal, Care After This sheet gives you information about how to care for yourself after your procedure. Your health care provider may also give you more specific instructions. If you have problems or questions, contact your health care provider. What can I expect after the procedure? After the procedure, it is common to have: Soreness or discomfort near the incision. Some swelling or bruising near the incision.  Follow these instructions at home: Incision care   Leave your outer dressing on for 72 hours.  After 72 hours you can remove your outer dressing and shower. Leave adhesive strips in place. These skin closures may need to stay in place for 1-2 weeks. If adhesive strip edges start to loosen and curl up, you may trim the loose edges.  You may remove the strips if they have not fallen off after 2 weeks. Check your incision area every day for signs of infection. Check for: Redness, swelling, or pain. Fluid or blood. Warmth. Pus or a bad smell. Do not take baths, swim, or use a hot tub until your incision is completely healed. If your wound site starts to bleed apply pressure.      If you have any questions/concerns please call the device clinic at [phone].  Activity  Return to your normal activities.  Contact a health care provider if: You have redness, swelling, or pain around your incision. You have a fever.

## 2021-02-23 DIAGNOSIS — R059 Cough, unspecified: Secondary | ICD-10-CM | POA: Diagnosis not present

## 2021-02-23 DIAGNOSIS — Z03818 Encounter for observation for suspected exposure to other biological agents ruled out: Secondary | ICD-10-CM | POA: Diagnosis not present

## 2021-03-02 DIAGNOSIS — J0110 Acute frontal sinusitis, unspecified: Secondary | ICD-10-CM | POA: Diagnosis not present

## 2021-03-03 DIAGNOSIS — R109 Unspecified abdominal pain: Secondary | ICD-10-CM | POA: Diagnosis not present

## 2021-03-04 ENCOUNTER — Encounter (HOSPITAL_BASED_OUTPATIENT_CLINIC_OR_DEPARTMENT_OTHER): Payer: Self-pay | Admitting: Emergency Medicine

## 2021-03-04 ENCOUNTER — Emergency Department (HOSPITAL_BASED_OUTPATIENT_CLINIC_OR_DEPARTMENT_OTHER): Payer: Medicare Other

## 2021-03-04 ENCOUNTER — Emergency Department (HOSPITAL_BASED_OUTPATIENT_CLINIC_OR_DEPARTMENT_OTHER)
Admission: EM | Admit: 2021-03-04 | Discharge: 2021-03-04 | Disposition: A | Discharge status: Home / Self Care | Payer: Medicare Other | Attending: Emergency Medicine | Admitting: Emergency Medicine

## 2021-03-04 ENCOUNTER — Other Ambulatory Visit: Payer: Self-pay

## 2021-03-04 DIAGNOSIS — I10 Essential (primary) hypertension: Secondary | ICD-10-CM | POA: Diagnosis not present

## 2021-03-04 DIAGNOSIS — R0602 Shortness of breath: Secondary | ICD-10-CM | POA: Diagnosis not present

## 2021-03-04 DIAGNOSIS — Z79899 Other long term (current) drug therapy: Secondary | ICD-10-CM | POA: Insufficient documentation

## 2021-03-04 DIAGNOSIS — K5732 Diverticulitis of large intestine without perforation or abscess without bleeding: Secondary | ICD-10-CM | POA: Insufficient documentation

## 2021-03-04 DIAGNOSIS — K449 Diaphragmatic hernia without obstruction or gangrene: Secondary | ICD-10-CM | POA: Diagnosis not present

## 2021-03-04 DIAGNOSIS — B9789 Other viral agents as the cause of diseases classified elsewhere: Secondary | ICD-10-CM | POA: Diagnosis not present

## 2021-03-04 DIAGNOSIS — E039 Hypothyroidism, unspecified: Secondary | ICD-10-CM | POA: Insufficient documentation

## 2021-03-04 DIAGNOSIS — K5792 Diverticulitis of intestine, part unspecified, without perforation or abscess without bleeding: Secondary | ICD-10-CM | POA: Diagnosis not present

## 2021-03-04 DIAGNOSIS — R109 Unspecified abdominal pain: Secondary | ICD-10-CM | POA: Diagnosis not present

## 2021-03-04 DIAGNOSIS — J069 Acute upper respiratory infection, unspecified: Secondary | ICD-10-CM | POA: Insufficient documentation

## 2021-03-04 DIAGNOSIS — K219 Gastro-esophageal reflux disease without esophagitis: Secondary | ICD-10-CM | POA: Diagnosis not present

## 2021-03-04 LAB — URINALYSIS, ROUTINE W REFLEX MICROSCOPIC
Bilirubin Urine: NEGATIVE
Glucose, UA: NEGATIVE mg/dL
Hgb urine dipstick: NEGATIVE
Ketones, ur: NEGATIVE mg/dL
Leukocytes,Ua: NEGATIVE
Nitrite: NEGATIVE
Protein, ur: NEGATIVE mg/dL
Specific Gravity, Urine: 1.008 (ref 1.005–1.030)
pH: 8 (ref 5.0–8.0)

## 2021-03-04 LAB — COMPREHENSIVE METABOLIC PANEL
ALT: 20 U/L (ref 0–44)
AST: 18 U/L (ref 15–41)
Albumin: 4 g/dL (ref 3.5–5.0)
Alkaline Phosphatase: 50 U/L (ref 38–126)
Anion gap: 9 (ref 5–15)
BUN: 10 mg/dL (ref 8–23)
CO2: 25 mmol/L (ref 22–32)
Calcium: 9.4 mg/dL (ref 8.9–10.3)
Chloride: 104 mmol/L (ref 98–111)
Creatinine, Ser: 0.74 mg/dL (ref 0.44–1.00)
GFR, Estimated: >60 mL/min (ref >60)
Glucose, Bld: 98 mg/dL (ref 70–99)
Potassium: 4 mmol/L (ref 3.5–5.1)
Sodium: 138 mmol/L (ref 135–145)
Total Bilirubin: 0.7 mg/dL (ref 0.3–1.2)
Total Protein: 7 g/dL (ref 6.5–8.1)

## 2021-03-04 LAB — CBC
HCT: 38 % (ref 36.0–46.0)
Hemoglobin: 12.9 g/dL (ref 12.0–15.0)
MCH: 30.6 pg (ref 26.0–34.0)
MCHC: 33.9 g/dL (ref 30.0–36.0)
MCV: 90.3 fL (ref 80.0–100.0)
Platelets: 230 10*3/uL (ref 150–400)
RBC: 4.21 MIL/uL (ref 3.87–5.11)
RDW: 12.7 % (ref 11.5–15.5)
WBC: 4.6 10*3/uL (ref 4.0–10.5)
nRBC: 0 % (ref 0.0–0.2)

## 2021-03-04 MED ORDER — PSEUDOEPHEDRINE HCL ER 120 MG PO TB12: 120.0000 mg | ORAL_TABLET | Freq: Two times a day (BID) | ORAL | Status: DC

## 2021-03-04 MED ORDER — EPINEPHRINE 0.3 MG/0.3ML IJ SOAJ: 0.3000 mg | INTRAMUSCULAR | 0 refills | Status: DC | PRN

## 2021-03-04 MED ORDER — LORATADINE 10 MG PO TABS
10.0000 mg | ORAL_TABLET | Freq: Every day | ORAL | Status: DC
  Administered 2021-03-04: 10 mg via ORAL
  Filled 2021-03-04: qty 1

## 2021-03-04 MED ORDER — SODIUM CHLORIDE 0.9 % IV BOLUS
1000.0000 mL | Freq: Once | INTRAVENOUS | Status: AC
  Administered 2021-03-04: 1000 mL via INTRAVENOUS

## 2021-03-04 MED ORDER — DEXAMETHASONE SODIUM PHOSPHATE 10 MG/ML IJ SOLN
10.0000 mg | Freq: Once | INTRAMUSCULAR | Status: AC
  Administered 2021-03-04: 10 mg via INTRAVENOUS
  Filled 2021-03-04: qty 1

## 2021-03-04 MED ORDER — HYDROCORTISONE SOD SUC (PF) 100 MG IJ SOLR: 250.0000 mg | Freq: Once | INTRAMUSCULAR | Status: DC

## 2021-03-04 MED ORDER — PSEUDOEPHEDRINE HCL 30 MG PO TABS
60.0000 mg | ORAL_TABLET | Freq: Four times a day (QID) | ORAL | Status: DC
  Filled 2021-03-04: qty 2

## 2021-03-04 MED ORDER — FENTANYL CITRATE PF 50 MCG/ML IJ SOSY
50.0000 ug | PREFILLED_SYRINGE | Freq: Once | INTRAMUSCULAR | Status: DC
  Filled 2021-03-04: qty 1

## 2021-03-04 MED ORDER — ALBUTEROL SULFATE HFA 108 (90 BASE) MCG/ACT IN AERS: 2.0000 | INHALATION_SPRAY | RESPIRATORY_TRACT | Status: DC | PRN

## 2021-03-04 MED ORDER — DIPHENHYDRAMINE HCL 50 MG/ML IJ SOLN
25.0000 mg | Freq: Once | INTRAMUSCULAR | Status: AC
  Administered 2021-03-04: 25 mg via INTRAVENOUS
  Filled 2021-03-04: qty 1

## 2021-03-04 MED ORDER — LORATADINE 10 MG PO TABS: 10.0000 mg | ORAL_TABLET | Freq: Every day | ORAL | Status: DC

## 2021-03-04 MED ORDER — ALBUTEROL SULFATE HFA 108 (90 BASE) MCG/ACT IN AERS: 2.0000 | INHALATION_SPRAY | Freq: Once | RESPIRATORY_TRACT | Status: DC

## 2021-03-04 NOTE — ED Triage Notes (Signed)
Pt reports awaking in the night with SOB and posterior throat swelling - pt states "it feels like there is a big lump in the back of my throat."  Pt able to talk without difficulty.  Pt reports swelling of both eyes and face in the night.  Pt took benadryl at 4am.  No marked facial edema noted at present.  Pt started course of Cefdinir yesterday Rx'd for lagging flu symptoms/sinus congestion.  Pt had taken cefdinir prior with no reaction.

## 2021-03-04 NOTE — Discharge Instructions (Signed)
Continue taking the medications that are prescribed to you.  Consider adding medication like Allegra for sinus congestion.  If you take the antibiotic, and started having difficulty in breathing, difficulty in swallowing -give yourself an epi shot  and call 911 immediately.

## 2021-03-04 NOTE — ED Provider Notes (Signed)
[facility] EMERGENCY DEPT Provider Note   CSN: [id number] Arrival date & time: 03/04/21  [id number]     History Chief Complaint  Patient presents with   Shortness of Breath    Heather Carey is a 70 y.o. female.  HPI     70 year old female comes in with chief complaint of shortness of breath, abdominal pain.  Patient reports that she has been sick for the last several weeks.  She had influenza, and then her sinuses started getting worse.  She was started on a cephalosporin antibiotic for it.  For the last 5 days she is also been having some abdominal pain.  Her PCP added Flagyl to her regimen -but she has not yet taken it.  The cephalosporin was started on Tuesday, this morning she woke up and felt lump in the back of her throat.  She is concerned that this could be an allergic reaction.  She had no wheezing, rash, nausea, or new abdominal pain associated with this.  Patient has had anaphylaxis in the past with contrast dye.  Patient took Benadryl prior to ED arrival, but continues to have the lump type feeling.  Additionally, patient is still having severe abdominal pain.  Her pain is worse with ambulation.  No bloody stools.  Past Medical History:  Diagnosis Date   Diverticulitis    Eosinophilic esophagitis    Fibromyalgia    GERD (gastroesophageal reflux disease)    Hyperlipidemia    Hypothyroidism    IBS (irritable bowel syndrome)    Migraines    Mitral regurgitation 02/28/2014   Mild to moderate by echo 02/2014   Obesity    Osteoarthritis    Otitis externa    Palpitations    TIA (transient ischemic attack)     Patient Active Problem List   Diagnosis Date Noted   SVT (supraventricular tachycardia) ([place]) 02/18/2021   Cryptogenic stroke ([place]) 02/18/2021   Malnutrition of moderate degree 03/09/2016   Syncope 03/08/2016   Sensory disturbance 03/08/2016   Hypothyroidism 03/08/2016   Urticaria with associated angioedema 04/27/2015   Angioedema 04/27/2015    Allergic reaction 01/07/2015   Food allergy 01/07/2015   Mitral regurgitation 02/28/2014   MVP (mitral valve prolapse) 02/24/2014   SOB (shortness of breath) 02/24/2014   Snoring 02/24/2014   Hypertension    Hyperlipidemia    Chest pain 02/10/2014    Past Surgical History:  Procedure Laterality Date   ABDOMINAL HYSTERECTOMY     carpel tunnel     lbp gso     LOOP RECORDER INSERTION N/A 05/08/2017   Procedure: LOOP RECORDER INSERTION;  Surgeon: Evans Lance, MD;  Location: [facility];  Service: Cardiovascular;  Laterality: N/A;   TONSILLECTOMY AND ADENOIDECTOMY       OB History   No obstetric history on file.     Family History  Problem Relation Age of Onset   Allergic rhinitis Mother    Urticaria Mother    Allergic rhinitis Father    Asthma Father    Allergic rhinitis Sister    Angioedema Sister    Urticaria Sister    Allergic rhinitis Brother    Asthma Brother     Social History   Tobacco Use   Smoking status: Never   Smokeless tobacco: Never  Vaping Use   Vaping Use: Never used  Substance Use Topics   Alcohol use: Not Currently    Alcohol/week: 1.0 standard drink    Types: 1 Glasses of wine per week  Drug use: No    Home Medications Prior to Admission medications   Medication Sig Start Date End Date Taking? Authorizing Provider  EPINEPHrine 0.3 mg/0.3 mL IJ SOAJ injection Inject 0.3 mg into the muscle as needed for anaphylaxis. 03/04/21  Yes Varney Biles, MD  acetaminophen (TYLENOL) 500 MG tablet Take 1,000 mg by mouth 2 (two) times daily as needed (migraine headaches).    [provider]  benzonatate (TESSALON) 100 MG capsule Take 1 capsule (100 mg total) by mouth every 8 (eight) hours. Patient not taking: Reported on 02/18/2021 08/26/20   Truddie Hidden, MD  diphenhydrAMINE (BENADRYL) 12.5 MG chewable tablet Chew 25 mg by mouth 2 (two) times daily as needed (allergic reaction).    [provider]  diphenhydrAMINE  (BENADRYL) 2 % cream Apply 1 application topically daily as needed for itching (allergic reactions).    [provider]  Eyelid Cleansers (THERATEARS STERILID CLEANSER) SOLN Place 1 application into both eyes 2 (two) times daily as needed (to cleanse eyes).    [provider]  HYDROcodone-acetaminophen (NORCO/VICODIN) 5-325 MG tablet Take 1 tablet by mouth every 6 (six) hours as needed for severe pain. Patient not taking: Reported on 02/18/2021 08/13/20   Truddie Hidden, MD  levothyroxine (SYNTHROID) 88 MCG tablet Take 88 mcg by mouth daily before breakfast.    [provider]  metoprolol succinate (TOPROL-XL) 25 MG 24 hr tablet TAKE 1/2 TABLET (12.5 MG TOTAL) BY MOUTH 2 (TWO) TIMES DAILY. 01/19/21   Evans Lance, MD  ondansetron (ZOFRAN-ODT) 4 MG disintegrating tablet Take 1 tablet (4 mg total) by mouth every 8 (eight) hours as needed for nausea or vomiting. 08/24/20   Zigmund Gottron, NP  predniSONE (STERAPRED UNI-PAK 21 TAB) 10 MG (21) TBPK tablet 10mg  Tabs, 6 day taper. Use as directed Patient not taking: Reported on 02/18/2021 08/26/20   Truddie Hidden, MD    Allergies    Azithromycin, Codeine, Ivp dye [iodinated diagnostic agents], Natamycin, Other, Rose, Shellfish allergy, Thimerosol [thimerosal], Strawberry extract, Adhesive [tape], Avelox [moxifloxacin hcl in nacl], Bacitracin, Cinnamon, Doxycycline, Ginger, Neomycin, Onion, Sunscreens [aminobenzoate], Watermelon [citrullus vulgaris], Benzalkonium chloride, Cephalosporins, and Penicillins  Review of Systems   Review of Systems  Constitutional:  Positive for activity change.  HENT:  Positive for congestion. Negative for trouble swallowing and voice change.   Respiratory:  Positive for shortness of breath.   Gastrointestinal:  Positive for abdominal pain.  All other systems reviewed and are negative.  Physical Exam Updated Vital Signs BP 122/63   Pulse 69   Temp 97.8 F (36.6 C) (Oral)   Resp 10    SpO2 100%   Physical Exam Vitals and nursing note reviewed.  Constitutional:      Appearance: She is well-developed.  HENT:     Head: Atraumatic.  Cardiovascular:     Rate and Rhythm: Normal rate.  Pulmonary:     Effort: Pulmonary effort is normal.  Abdominal:     Tenderness: There is abdominal tenderness.     Comments: Diffuse bilateral lower quadrant tenderness with guarding but no rebound.  Abdomen is still soft.  Musculoskeletal:     Cervical back: Normal range of motion and neck supple.  Skin:    General: Skin is warm and dry.  Neurological:     Mental Status: She is alert and oriented to person, place, and time.    ED Results / Procedures / Treatments   Labs (all labs ordered are listed, but only abnormal  results are displayed) Labs Reviewed  URINALYSIS, ROUTINE W REFLEX MICROSCOPIC - Abnormal; Notable for the following components:      Result Value   Color, Urine COLORLESS (*)    All other components within normal limits  COMPREHENSIVE METABOLIC PANEL  CBC    EKG None  Radiology CT ABDOMEN PELVIS WO CONTRAST  Result Date: 03/04/2021 CLINICAL DATA:  70 year old female with acute abdominal and pelvic pain. EXAM: CT ABDOMEN AND PELVIS WITHOUT CONTRAST TECHNIQUE: Multidetector CT imaging of the abdomen and pelvis was performed following the standard protocol without IV contrast. COMPARISON:  None. FINDINGS: Please note that parenchymal and vascular abnormalities may be missed as intravenous contrast was not administered. Lower chest: No acute abnormality Hepatobiliary: The liver and gallbladder are unremarkable. No biliary dilatation. Pancreas: Unremarkable Spleen: Unremarkable Adrenals/Urinary Tract: Bilateral parapelvic renal cysts are present. There is no evidence of hydronephrosis or urinary calculi. The adrenal glands and bladder are unremarkable. Stomach/Bowel: Mild inflammation adjacent to the proximal sigmoid colon is noted likely representing mild  diverticulitis. There is no evidence of bowel obstruction, focal collection or pneumoperitoneum. Scattered colonic diverticula are identified. A small to moderate hiatal hernia is noted. The appendix is normal. Vascular/Lymphatic: Aortic atherosclerosis. No enlarged abdominal or pelvic lymph nodes. Reproductive: Status post hysterectomy. No adnexal masses. Other: No ascites. Musculoskeletal: No acute or suspicious bony abnormalities are noted. IMPRESSION: 1. Mild inflammation adjacent to the proximal sigmoid colon likely representing mild diverticulitis. No evidence of bowel obstruction, focal collection or pneumoperitoneum. Clinical follow-up recommended. 2. Small to moderate hiatal hernia. 3. Aortic Atherosclerosis (ICD10-I70.0). Electronically Signed   By: Harmon Pier M.D.   On: 03/04/2021 11:38   DG Chest Port 1 View  Result Date: 03/04/2021 CLINICAL DATA:  Shortness of breath. EXAM: PORTABLE CHEST 1 VIEW COMPARISON:  Chest x-ray dated Aug 26, 2020. FINDINGS: The heart size and mediastinal contours are within normal limits. Normal pulmonary vascularity. No focal consolidation, pleural effusion, or pneumothorax. Unchanged mild linear scarring in the lingula. No acute osseous abnormality. IMPRESSION: 1. No active disease. Electronically Signed   By: Obie Dredge M.D.   On: 03/04/2021 10:34    Procedures Procedures   Medications Ordered in ED Medications  fentaNYL (SUBLIMAZE) injection 50 mcg (50 mcg Intravenous Patient Refused/Not Given 03/04/21 1153)  albuterol (VENTOLIN HFA) 108 (90 Base) MCG/ACT inhaler 2 puff (has no administration in time range)  loratadine (CLARITIN) tablet 10 mg (10 mg Oral Given 03/04/21 1049)    And  pseudoephedrine (SUDAFED) tablet 60 mg (60 mg Oral Patient Refused/Not Given 03/04/21 1154)  dexamethasone (DECADRON) injection 10 mg (has no administration in time range)  sodium chloride 0.9 % bolus 1,000 mL (0 mLs Intravenous Stopped 03/04/21 1207)  diphenhydrAMINE  (BENADRYL) injection 25 mg (25 mg Intravenous Given 03/04/21 0959)    ED Course  I have reviewed the triage vital signs and the nursing notes.  Pertinent labs & imaging results that were available during my care of the patient were reviewed by me and considered in my medical decision making (see chart for details).    MDM Rules/Calculators/A&P                           70 year old female comes in with chief complaint of allergic reaction.  It seems like she had flu followed by sinusitis and then diverticulitis.  She has multiple allergies listed, has actually tolerated cephalosporins in the past.  She is unsure about specific reaction to penicillin.  She has had anaphylaxis to IV contrast.  On exam, no stridor, no drooling and patient denies dysphagia, dysphonia.  She had no other systemic symptoms consistent with allergic reaction.  I am not fully convinced that she had anaphylaxis or angioedema.  It is possible that her symptoms were simply due to the edematous sinuses and soft tissue secondary to her viral illness.  Patient was observed for allergic reaction for over 4 hours.  She had no worsening of her symptoms.  Dexamethasone was given at the end of the shift.  Patient and I had a long conversation about her symptoms.  She was given the option of starting her on ciprofloxacin versus keeping cephalosporin on.  She is also allergic to Avelox and azithromycin, severely limiting her ability to treat diverticulitis.  Patient has agreed to keep a cephalosporin on.  We will give her EpiPen prescription however.  Patient on abdominal exam had significant guarding. CT scan was ordered because of her informing us that her pain is worse with ambulation.  It is negative for any complications of diverticulitis.   The patient appears reasonably screened and/or stabilized for discharge and I doubt any other medical condition or other [facility] requiring further screening, evaluation, or treatment in the ED  at this time prior to discharge.   Results from the ER workup discussed with the patient face to face and all questions answered to the best of my ability. The patient is safe for discharge with strict return precautions.   Final Clinical Impression(s) / ED Diagnoses Final diagnoses:  Diverticulitis  Viral URI with cough    Rx / DC Orders ED Discharge Orders          Ordered    EPINEPHrine 0.3 mg/0.3 mL IJ SOAJ injection  As needed        03/04/21 1344             Varney Biles, MD 03/04/21 1350

## 2021-03-04 NOTE — ED Notes (Signed)
Pt refused fentanyl and any pain medication at this time.

## 2021-03-04 NOTE — ED Notes (Signed)
Pt states she is unable to urinate at this time.

## 2021-03-04 NOTE — ED Notes (Signed)
Patient arrived with cc of shob and throat swelling. BBS clear; no stridor noted. Per patient, took benadryl at 4am. No concerns of airway compromise at the time of assessment.

## 2021-03-11 DIAGNOSIS — Z03818 Encounter for observation for suspected exposure to other biological agents ruled out: Secondary | ICD-10-CM | POA: Diagnosis not present

## 2021-03-11 DIAGNOSIS — R059 Cough, unspecified: Secondary | ICD-10-CM | POA: Diagnosis not present

## 2021-03-11 DIAGNOSIS — R051 Acute cough: Secondary | ICD-10-CM | POA: Diagnosis not present

## 2021-03-11 DIAGNOSIS — B349 Viral infection, unspecified: Secondary | ICD-10-CM | POA: Diagnosis not present

## 2021-03-11 DIAGNOSIS — R5381 Other malaise: Secondary | ICD-10-CM | POA: Diagnosis not present

## 2021-03-25 DIAGNOSIS — J209 Acute bronchitis, unspecified: Secondary | ICD-10-CM | POA: Diagnosis not present

## 2021-04-16 ENCOUNTER — Encounter: Payer: Medicare Other | Admitting: Internal Medicine

## 2021-05-10 DIAGNOSIS — U071 COVID-19: Secondary | ICD-10-CM | POA: Diagnosis not present

## 2021-05-23 DIAGNOSIS — J0140 Acute pansinusitis, unspecified: Secondary | ICD-10-CM | POA: Diagnosis not present

## 2021-07-05 ENCOUNTER — Telehealth: Payer: Self-pay | Admitting: Internal Medicine

## 2021-07-05 NOTE — Telephone Encounter (Signed)
Patient c/o Palpitations:  High priority if patient c/o lightheadedness, shortness of breath, or chest pain ? ?How long have you had palpitations/irregular HR/ Afib? Are you having the symptoms now? Tachycardia Friday, Saturday, Sunday and a little today   ? ?Are you currently experiencing lightheadedness, SOB or CP?  Dizziness, heart pounding  and nauseated on Saturday night ? ?Do you have a history of afib (atrial fibrillation) or irregular heart rhythm?  ? ? ?Have you checked your BP or HR? (document readings if available):  have not checked it ? ?Are you experiencing any other symptoms? No- pt wanted an appointment- first available appointment is 07-15-21 with Renee- please call to evaluate ? ?

## 2021-07-05 NOTE — Telephone Encounter (Signed)
Sent mychart message

## 2021-07-06 DIAGNOSIS — H903 Sensorineural hearing loss, bilateral: Secondary | ICD-10-CM | POA: Diagnosis not present

## 2021-07-06 NOTE — Telephone Encounter (Signed)
Pt has read [employer]. ? ?Will be able if she has any needs prior to upcoming appt. ?

## 2021-07-12 NOTE — Progress Notes (Signed)
? ?Cardiology Office Note ?Date:  07/12/2021  ?Patient ID:  Heather Carey, DOB Oct 24, 1950, MRN [id number] ?PCP:  Sigmund Hazel, MD  ?Cardiologist:  Dr. Mayford Knife ?Electrophysiologist: Dr. Ladona Ridgel ? ?  ?Chief Complaint:  c/o fatigue, palpitations ? ?History of Present Illness: ?Heather Carey is a 71 y.o. female with history of fibromyalgia, HTN, HLD, hypothyroidism, TIA, OA, SVT (adenosine ineffective, responded to vagal maneuver) ? ? ?She comes in today to be seen for Dr. Ladona Ridgel, last seen by him Nov 2022, at that time discussed SOB/DOE.  Some SVTs terminated by vagal maneuvers, she requested loop removal. ?Mentioned that she had not had any AF on the monitor and the loop was removed, discussed return if recurrent SVT's/unable to control them ? ?The patient recently called with palpitations, nausea and requested an appt ? ?TODAY ?She not feeling great. ?Mentions she had Covid last summer, and again 64mo ago, with the Flu as well this winter, since her last COVID infection she has never felt well again. ?Feels exhausted all of the time, no energy ?She called her PMD though unable to get in until May ?She is SOB with minimal exertion, and feels her heart pounding in her chest.  This is NOT her SVT. ?It is not necessarily fast but pounding in her chest. ?She has had some of her SVT, had some last weekend. ?But she is worried with a couple covid illnesses now that she has had some heart damage. ? ?No near syncope or syncope, no CP. ?No resting pounding or SOB ? ? ? ?Past Medical History:  ?Diagnosis Date  ? Diverticulitis   ? Eosinophilic esophagitis   ? Fibromyalgia   ? GERD (gastroesophageal reflux disease)   ? Hyperlipidemia   ? Hypothyroidism   ? IBS (irritable bowel syndrome)   ? Migraines   ? Mitral regurgitation 02/28/2014  ? Mild to moderate by echo 02/2014  ? Obesity   ? Osteoarthritis   ? Otitis externa   ? Palpitations   ? TIA (transient ischemic attack)   ? ? ?Past Surgical History:  ?Procedure Laterality Date  ?  ABDOMINAL HYSTERECTOMY    ? carpel tunnel    ? lbp gso    ? LOOP RECORDER INSERTION N/A 05/08/2017  ? Procedure: LOOP RECORDER INSERTION;  Surgeon: Marinus Maw, MD;  Location: [facility];  Service: Cardiovascular;  Laterality: N/A;  ? TONSILLECTOMY AND ADENOIDECTOMY    ? ? ?Current Outpatient Medications  ?Medication Sig Dispense Refill  ? acetaminophen (TYLENOL) 500 MG tablet Take 1,000 mg by mouth 2 (two) times daily as needed (migraine headaches).    ? benzonatate (TESSALON) 100 MG capsule Take 1 capsule (100 mg total) by mouth every 8 (eight) hours. (Patient not taking: Reported on 02/18/2021) 21 capsule 0  ? diphenhydrAMINE (BENADRYL) 12.5 MG chewable tablet Chew 25 mg by mouth 2 (two) times daily as needed (allergic reaction).    ? diphenhydrAMINE (BENADRYL) 2 % cream Apply 1 application topically daily as needed for itching (allergic reactions).    ? EPINEPHrine 0.3 mg/0.3 mL IJ SOAJ injection Inject 0.3 mg into the muscle as needed for anaphylaxis. 2 each 0  ? Eyelid Cleansers (THERATEARS STERILID CLEANSER) SOLN Place 1 application into both eyes 2 (two) times daily as needed (to cleanse eyes).    ? HYDROcodone-acetaminophen (NORCO/VICODIN) 5-325 MG tablet Take 1 tablet by mouth every 6 (six) hours as needed for severe pain. (Patient not taking: Reported on 02/18/2021) 12 tablet 0  ? levothyroxine (SYNTHROID) 88 MCG  tablet Take 88 mcg by mouth daily before breakfast.    ? metoprolol succinate (TOPROL-XL) 25 MG 24 hr tablet TAKE 1/2 TABLET (12.5 MG TOTAL) BY MOUTH 2 (TWO) TIMES DAILY. 90 tablet 3  ? ondansetron (ZOFRAN-ODT) 4 MG disintegrating tablet Take 1 tablet (4 mg total) by mouth every 8 (eight) hours as needed for nausea or vomiting. 12 tablet 0  ? predniSONE (STERAPRED UNI-PAK 21 TAB) 10 MG (21) TBPK tablet 10mg  Tabs, 6 day taper. Use as directed (Patient not taking: Reported on 02/18/2021) 1 each 0  ? ?No current facility-administered medications for this visit.  ? ? ?Allergies:    Azithromycin, Codeine, Ivp dye [iodinated contrast media], Natamycin, Other, Rose, Shellfish allergy, Thimerosol [thimerosal], Strawberry extract, Adhesive [tape], Avelox [moxifloxacin hcl in nacl], Bacitracin, Cinnamon, Doxycycline, Ginger, Neomycin, Onion, Sunscreens [aminobenzoate], Watermelon [citrullus vulgaris], Benzalkonium chloride, Cephalosporins, and Penicillins  ? ?Social History:  The patient  reports that she has never smoked. She has never used smokeless tobacco. She reports that she does not currently use alcohol after a past usage of about 1.0 standard drink per week. She reports that she does not use drugs.  ? ?Family History:  The patient's family history includes Allergic rhinitis in her brother, father, mother, and sister; Angioedema in her sister; Asthma in her brother and father; Urticaria in her mother and sister. ? ?ROS:  Please see the history of present illness.    ?All other systems are reviewed and otherwise negative.  ? ?PHYSICAL EXAM:  ?VS:  There were no vitals taken for this visit. BMI: There is no height or weight on file to calculate BMI. ?Well nourished, well developed, in no acute distress ?HEENT: normocephalic, atraumatic ?Neck: no JVD, carotid bruits or masses ?Cardiac:   RRR; no significant murmurs, no rubs, or gallops ?Lungs:  CTA b/l, no wheezing, rhonchi or rales ?Abd: soft, nontender ?MS: no deformity or atrophy ?Ext: no edema ?Skin: warm and dry, no rash ?Neuro:  No gross deficits appreciated ?Psych: euthymic mood, full affect ? ? ? ?EKG:  Done today and reviewed by myself shows  ?SR 68bpm, normal intervals ? ? ?07/24/2020: TTE ? 1. Left ventricular ejection fraction, by estimation, is 60 to 65%. The  ?left ventricle has normal function. The left ventricle has no regional  ?wall motion abnormalities. Left ventricular diastolic parameters were  ?normal.  ? 2. Right ventricular systolic function is normal. The right ventricular  ?size is normal.  ? 3. The mitral valve is  normal in structure. Mild mitral valve  ?regurgitation.  ? 4. The aortic valve is tricuspid. Aortic valve regurgitation is not  ?visualized. Mild aortic valve sclerosis is present, with no evidence of  ?aortic valve stenosis.  ? 5. The inferior vena cava is normal in size with greater than 50%  ?respiratory variability, suggesting right atrial pressure of 3 mmHg.  ? ?Comparison(s): The left ventricular function is unchanged.  ? ? ?06/24/2019: stress myoview ?Nuclear stress EF: 64%. ?There was no ST segment deviation noted during stress. ?The study is normal. ?This is a low risk study. ?The left ventricular ejection fraction is normal (55-65%). ?  ?Normal pharmacologic nuclear study with no evidence for prior infarct or ischemia. Normal LVEF.  ?  ? ?Recent Labs: ?03/04/2021: ALT 20; BUN 10; Creatinine, Ser 0.74; Hemoglobin 12.9; Platelets 230; Potassium 4.0; Sodium 138  ?No results found for requested labs within last 8760 hours.  ? ?CrCl cannot be calculated (Patient's most recent lab result is older than the maximum  21 days allowed.).  ? ?Wt Readings from Last 3 Encounters:  ?02/18/21 197 lb 6.4 oz (89.5 kg)  ?01/12/21 190 lb (86.2 kg)  ?08/26/20 190 lb (86.2 kg)  ?  ? ?Other studies reviewed: ?Additional studies/records reviewed today include: summarized above ? ?ASSESSMENT AND PLAN: ? ?SVT ?Had some last weekend, otherwise burden has been OK  ? ?HTN ?Looks OK, could tolerate more BB if needed ? ?3.   Palpitations, SOB, post COVID a couple months ago ?Will get some labs today ?Plan an echo and 3 day Zio XT to see what these new/different palpitations are ? ?Disposition: F/u with [place] in 6 weeks, sooner if needed ? ?Current medicines are reviewed at length with the patient today.  The patient did not have any concerns regarding medicines. ? ?Signed, ?Francis Dowse, PA-C ?07/12/2021 10:19 AM    ? ?[facility] ?[address] ?[address] ? [place] [address] ?[phone] (office)  ?[phone] (fax) ? ? ?

## 2021-07-13 ENCOUNTER — Ambulatory Visit (INDEPENDENT_AMBULATORY_CARE_PROVIDER_SITE_OTHER): Payer: Medicare Other

## 2021-07-13 ENCOUNTER — Ambulatory Visit: Payer: Medicare Other | Admitting: Physician Assistant

## 2021-07-13 ENCOUNTER — Encounter: Payer: Self-pay | Admitting: Physician Assistant

## 2021-07-13 VITALS — BP 120/70 | HR 74 | Ht 65.5 in | Wt 194.0 lb

## 2021-07-13 DIAGNOSIS — R0602 Shortness of breath: Secondary | ICD-10-CM

## 2021-07-13 DIAGNOSIS — I10 Essential (primary) hypertension: Secondary | ICD-10-CM | POA: Diagnosis not present

## 2021-07-13 DIAGNOSIS — R002 Palpitations: Secondary | ICD-10-CM

## 2021-07-13 DIAGNOSIS — I471 Supraventricular tachycardia: Secondary | ICD-10-CM

## 2021-07-13 LAB — BASIC METABOLIC PANEL
BUN/Creatinine Ratio: 20 (ref 12–28)
BUN: 14 mg/dL (ref 8–27)
CO2: 24 mmol/L (ref 20–29)
Calcium: 9.4 mg/dL (ref 8.7–10.3)
Chloride: 103 mmol/L (ref 96–106)
Creatinine, Ser: 0.69 mg/dL (ref 0.57–1.00)
Glucose: 80 mg/dL (ref 70–99)
Potassium: 4 mmol/L (ref 3.5–5.2)
Sodium: 140 mmol/L (ref 134–144)
eGFR: 94 mL/min/{1.73_m2} (ref >59)

## 2021-07-13 LAB — CBC
Hematocrit: 37.9 % (ref 34.0–46.6)
Hemoglobin: 12.8 g/dL (ref 11.1–15.9)
MCH: 31.1 pg (ref 26.6–33.0)
MCHC: 33.8 g/dL (ref 31.5–35.7)
MCV: 92 fL (ref 79–97)
Platelets: 207 10*3/uL (ref 150–450)
RBC: 4.12 x10E6/uL (ref 3.77–5.28)
RDW: 12.7 % (ref 11.7–15.4)
WBC: 7.1 10*3/uL (ref 3.4–10.8)

## 2021-07-13 LAB — TSH: TSH: 2.13 u[IU]/mL (ref 0.450–4.500)

## 2021-07-13 NOTE — Patient Instructions (Signed)
Medication Instructions:  ? ? ?Your physician recommends that you continue on your current medications as directed. Please refer to the Current Medication list given to you today. ? ? ?*If you need a refill on your cardiac medications before your next appointment, please call your pharmacy* ? ? ?Lab Work:  BMET CBC AND TSH TODAY  ? ?If you have labs (blood work) drawn today and your tests are completely normal, you will receive your results only by: ?MyChart Message (if you have MyChart) OR ?A paper copy in the mail ?If you have any lab test that is abnormal or we need to change your treatment, we will call you to review the results. ? ? ?Testing/Procedures: Your physician has requested that you have an echocardiogram. Echocardiography is a painless test that uses sound waves to create images of your heart. It provides your doctor with information about the size and shape of your heart and how well your heart?s chambers and valves are working. This procedure takes approximately one hour. There are no restrictions for this procedure. ? ? ? ?Follow-Up: ?At [facility], you and your health needs are our priority.  As part of our continuing mission to provide you with exceptional heart care, we have created designated Provider Care Teams.  These Care Teams include your primary Cardiologist (physician) and Advanced Practice Providers (APPs -  Physician Assistants and Nurse Practitioners) who all work together to provide you with the care you need, when you need it. ? ?We recommend signing up for the patient portal called "MyChart".  Sign up information is provided on this After Visit Summary.  MyChart is used to connect with patients for Virtual Visits (Telemedicine).  Patients are able to view lab/test results, encounter notes, upcoming appointments, etc.  Non-urgent messages can be sent to your provider as well.   ?To learn more about what you can do with MyChart, go to [email or web address].   ? ?Your next  appointment:   ?6 week(s) ? ?The format for your next appointment:   ?In Person ? ?Provider:   ?Tommye Standard, PA-C{ ? ? ? ? ?Other Instructions ? ?

## 2021-07-13 NOTE — Progress Notes (Unsigned)
Applied a 3 day Zio XT monitor to patient in the office ? ?Dr Lovena Le to read ?

## 2021-07-23 DIAGNOSIS — R002 Palpitations: Secondary | ICD-10-CM | POA: Diagnosis not present

## 2021-07-23 DIAGNOSIS — R0602 Shortness of breath: Secondary | ICD-10-CM | POA: Diagnosis not present

## 2021-07-30 ENCOUNTER — Telehealth: Payer: Self-pay

## 2021-07-30 ENCOUNTER — Ambulatory Visit (HOSPITAL_BASED_OUTPATIENT_CLINIC_OR_DEPARTMENT_OTHER): Payer: Medicare Other

## 2021-07-30 ENCOUNTER — Emergency Department (HOSPITAL_COMMUNITY): Payer: Medicare Other

## 2021-07-30 ENCOUNTER — Other Ambulatory Visit: Payer: Self-pay

## 2021-07-30 ENCOUNTER — Encounter (HOSPITAL_COMMUNITY): Payer: Self-pay

## 2021-07-30 ENCOUNTER — Emergency Department (HOSPITAL_COMMUNITY)
Admission: EM | Admit: 2021-07-30 | Discharge: 2021-07-30 | Disposition: A | Discharge status: Home / Self Care | Payer: Medicare Other | Attending: Emergency Medicine | Admitting: Emergency Medicine

## 2021-07-30 DIAGNOSIS — R002 Palpitations: Secondary | ICD-10-CM

## 2021-07-30 DIAGNOSIS — R0602 Shortness of breath: Secondary | ICD-10-CM | POA: Insufficient documentation

## 2021-07-30 DIAGNOSIS — R079 Chest pain, unspecified: Secondary | ICD-10-CM | POA: Diagnosis not present

## 2021-07-30 DIAGNOSIS — R000 Tachycardia, unspecified: Secondary | ICD-10-CM | POA: Diagnosis not present

## 2021-07-30 DIAGNOSIS — T50905A Adverse effect of unspecified drugs, medicaments and biological substances, initial encounter: Secondary | ICD-10-CM

## 2021-07-30 DIAGNOSIS — R519 Headache, unspecified: Secondary | ICD-10-CM | POA: Insufficient documentation

## 2021-07-30 DIAGNOSIS — T508X5A Adverse effect of diagnostic agents, initial encounter: Secondary | ICD-10-CM | POA: Insufficient documentation

## 2021-07-30 DIAGNOSIS — I499 Cardiac arrhythmia, unspecified: Secondary | ICD-10-CM | POA: Diagnosis not present

## 2021-07-30 DIAGNOSIS — Z79899 Other long term (current) drug therapy: Secondary | ICD-10-CM | POA: Insufficient documentation

## 2021-07-30 DIAGNOSIS — E878 Other disorders of electrolyte and fluid balance, not elsewhere classified: Secondary | ICD-10-CM | POA: Diagnosis not present

## 2021-07-30 DIAGNOSIS — T7840XA Allergy, unspecified, initial encounter: Secondary | ICD-10-CM

## 2021-07-30 DIAGNOSIS — Z743 Need for continuous supervision: Secondary | ICD-10-CM | POA: Diagnosis not present

## 2021-07-30 DIAGNOSIS — R0789 Other chest pain: Secondary | ICD-10-CM | POA: Diagnosis not present

## 2021-07-30 DIAGNOSIS — I739 Peripheral vascular disease, unspecified: Secondary | ICD-10-CM | POA: Diagnosis not present

## 2021-07-30 DIAGNOSIS — R29818 Other symptoms and signs involving the nervous system: Secondary | ICD-10-CM | POA: Diagnosis not present

## 2021-07-30 LAB — CBC WITH DIFFERENTIAL/PLATELET
Abs Immature Granulocytes: 0.03 10*3/uL (ref 0.00–0.07)
Basophils Absolute: 0.1 10*3/uL (ref 0.0–0.1)
Basophils Relative: 1 %
Eosinophils Absolute: 0.2 10*3/uL (ref 0.0–0.5)
Eosinophils Relative: 3 %
HCT: 34.9 % — ABNORMAL LOW (ref 36.0–46.0)
Hemoglobin: 11.8 g/dL — ABNORMAL LOW (ref 12.0–15.0)
Immature Granulocytes: 1 %
Lymphocytes Relative: 28 %
Lymphs Abs: 1.6 10*3/uL (ref 0.7–4.0)
MCH: 31.4 pg (ref 26.0–34.0)
MCHC: 33.8 g/dL (ref 30.0–36.0)
MCV: 92.8 fL (ref 80.0–100.0)
Monocytes Absolute: 0.4 10*3/uL (ref 0.1–1.0)
Monocytes Relative: 7 %
Neutro Abs: 3.5 10*3/uL (ref 1.7–7.7)
Neutrophils Relative %: 60 %
Platelets: 186 10*3/uL (ref 150–400)
RBC: 3.76 MIL/uL — ABNORMAL LOW (ref 3.87–5.11)
RDW: 12.9 % (ref 11.5–15.5)
WBC: 5.7 10*3/uL (ref 4.0–10.5)
nRBC: 0 % (ref 0.0–0.2)

## 2021-07-30 LAB — BASIC METABOLIC PANEL
Anion gap: 4 — ABNORMAL LOW (ref 5–15)
BUN: 14 mg/dL (ref 8–23)
CO2: 23 mmol/L (ref 22–32)
Calcium: 8.4 mg/dL — ABNORMAL LOW (ref 8.9–10.3)
Chloride: 114 mmol/L — ABNORMAL HIGH (ref 98–111)
Creatinine, Ser: 0.91 mg/dL (ref 0.44–1.00)
GFR, Estimated: >60 mL/min (ref >60)
Glucose, Bld: 96 mg/dL (ref 70–99)
Potassium: 3.9 mmol/L (ref 3.5–5.1)
Sodium: 141 mmol/L (ref 135–145)

## 2021-07-30 LAB — ECHOCARDIOGRAM COMPLETE
Area-P 1/2: 2.76 cm2
MV M vel: 4.76 m/s
MV Peak grad: 90.6 mmHg
Radius: 0.3 cm
S' Lateral: 3.2 cm

## 2021-07-30 MED ORDER — PERFLUTREN LIPID MICROSPHERE
1.0000 mL | INTRAVENOUS | Status: AC | PRN
  Administered 2021-07-30: 1 mL via INTRAVENOUS

## 2021-07-30 MED ORDER — DIPHENHYDRAMINE HCL 50 MG/ML IJ SOLN
25.0000 mg | Freq: Once | INTRAMUSCULAR | Status: AC
  Administered 2021-07-30: 25 mg via INTRAMUSCULAR

## 2021-07-30 MED ORDER — SODIUM CHLORIDE 0.9 % IV BOLUS
1000.0000 mL | Freq: Once | INTRAVENOUS | Status: AC
Start: 2021-07-30 — End: 2021-07-30
  Administered 2021-07-30: 1000 mL via INTRAVENOUS

## 2021-07-30 MED ORDER — ACETAMINOPHEN 500 MG PO TABS
1000.0000 mg | ORAL_TABLET | Freq: Once | ORAL | Status: AC
  Administered 2021-07-30: 1000 mg via ORAL
  Filled 2021-07-30: qty 2

## 2021-07-30 MED ORDER — SODIUM CHLORIDE 0.9 % IV BOLUS
100.0000 mL | Freq: Once | INTRAVENOUS | Status: AC
  Administered 2021-07-30: 100 mL via INTRAVENOUS

## 2021-07-30 NOTE — Telephone Encounter (Signed)
Called by Antonietta Breach, RN while working in triage, who asked per verbal order of Dr Eden Emms that someone report to ECHO room 2 to administer 25mg  IV Benadryl and 0.9% NaCl at 100cc/hr to patient experiencing reaction to Difinity. This RN responded to situation. Meds removed from code cart. Arrived to bedside with Dr and Eden Emms present. Pt A& Ox4. Delayed speech, body shaking and stating she was cold. Nishan evaluated, stated that if pt's black floaters didn't resolve after medication, she would need further evaluation in the ED. Pt's husband called to bedside by Dewitt Hoes. IV in place in LAC. Administered medications at 0919. Pt remains stable and condition unchanged. Repeat BP showed 151/88, pulse 85. Dr Dewitt Hoes made aware. Billy called approx. Eden Emms to inform that black floaters had subsided, but gave way to yellow spots. Nishan made aware and advised that pt go to be evaluated and husband may drive her. [id number] made aware. Confirms that he will d/c fluids and IV and send pt with husband to ED. ?

## 2021-07-30 NOTE — ED Provider Notes (Signed)
?[facility] ?Provider Note ? ? ?CSN: [id number] ?Arrival date & time: 07/30/21  1040 ? ?  ? ?History ? ?Chief Complaint  ?Patient presents with  ? Allergic Reaction  ? ? ?Heather Carey is a 71 y.o. female. ? ?The history is provided by the patient and medical records. No language interpreter was used.  ?Allergic Reaction ? ?71 year old female significant history of fibromyalgia, TIA, migraine, hyperlipidemia also has a long list of drug allergies including IV contrast brought here via EMS from cardiology office with concerns for allergic reaction to Difinity (Perflutren).  Patient reported approximately 3 hours ago she had an echocardiogram and shortly after Definity was injected she developed throbbing headache with pressure behind both eyes, floaters and black spots from both eyes right greater than left as well as a yellow tinge of color in her visual field.  She also reported feeling very shaky.  Cardiologist did order IV fluid and 25 mg of Benadryl and sent patient here.  Since then she endorsed improvement of her shakiness but still endorse some mild headache and visual changes.  She denies any tongue swelling, trouble swallowing, wheezing, abdominal cramping, or rash.  She has multiple drug allergies in the past usually with angioedema but this felt different.  Patient had echocardiogram for work-up of her heart palpitation. ? ?Home Medications ?Prior to Admission medications   ?Medication Sig Start Date End Date Taking? Authorizing Provider  ?acetaminophen (TYLENOL) 500 MG tablet Take 1,000 mg by mouth 2 (two) times daily as needed (migraine headaches).    [provider]  ?diphenhydrAMINE (BENADRYL) 12.5 MG chewable tablet Chew 25 mg by mouth 2 (two) times daily as needed (allergic reaction).    [provider]  ?diphenhydrAMINE (BENADRYL) 2 % cream Apply 1 application topically daily as needed for itching (allergic reactions).    [provider]  ?EPINEPHrine 0.3 mg/0.3 mL IJ SOAJ injection Inject 0.3 mg into the muscle as needed for anaphylaxis. 03/04/21   Varney Biles, MD  ?levothyroxine (SYNTHROID) 88 MCG tablet Take 88 mcg by mouth daily before breakfast.    [provider]  ?metoprolol succinate (TOPROL-XL) 25 MG 24 hr tablet TAKE 1/2 TABLET (12.5 MG TOTAL) BY MOUTH 2 (TWO) TIMES DAILY. 01/19/21   Evans Lance, MD  ?   ? ?Allergies    ?Azithromycin, Codeine, Ivp dye [iodinated contrast media], Natamycin, Other, Rose, Shellfish allergy, Thimerosol [thimerosal], Adhesive [tape], Avelox [moxifloxacin hcl in nacl], Bacitracin, Cinnamon, Definity [perflutren lipid microsphere], Doxycycline, Ginger, Neomycin, Onion, Raspberry, Sunscreens [aminobenzoate], Watermelon [citrullus vulgaris], Benzalkonium chloride, Cephalosporins, and Penicillins   ? ?Review of Systems   ?Review of Systems  ?All other systems reviewed and are negative. ? ?Physical Exam ?Updated Vital Signs ?BP 126/68 (BP Location: Left Arm)   Pulse 77   Temp 97.9 ?F (36.6 ?C) (Oral)   Resp 16   Ht 5\' 5"  (1.651 m)   Wt 88 kg   SpO2 100%   BMI 32.28 kg/m?  ?Physical Exam ?Vitals and nursing note reviewed.  ?Constitutional:   ?   General: She is not in acute distress. ?   Appearance: She is well-developed.  ?   Comments: Well-appearing female laying in bed appears to be in no acute discomfort.  ?HENT:  ?   Head: Atraumatic.  ?Eyes:  ?   General: No scleral icterus. ?   Extraocular Movements: Extraocular movements intact.  ?   Conjunctiva/sclera: Conjunctivae normal.  ?   Pupils: Pupils are equal, round,  and reactive to light.  ?   Comments: Pupils equal round reactive, extraocular movement intact, normal conjunctiva.  ?Cardiovascular:  ?   Rate and Rhythm: Normal rate and regular rhythm.  ?   Pulses: Normal pulses.  ?   Heart sounds: Normal heart sounds.  ?Pulmonary:  ?   Effort: Pulmonary effort is normal.  ?   Breath sounds: No wheezing.  ?Abdominal:  ?   Palpations:  Abdomen is soft.  ?   Tenderness: There is no abdominal tenderness.  ?Musculoskeletal:  ?   Cervical back: Normal range of motion and neck supple. No rigidity.  ?Skin: ?   Findings: No rash.  ?   Comments: No urticaria  ?Neurological:  ?   Mental Status: She is alert and oriented to person, place, and time.  ?   GCS: GCS eye subscore is 4. GCS verbal subscore is 5. GCS motor subscore is 6.  ?   Cranial Nerves: Cranial nerves 2-12 are intact.  ?   Motor: Motor function is intact.  ?   Coordination: Coordination is intact.  ?Psychiatric:     ?   Mood and Affect: Mood normal.  ? ? ?ED Results / Procedures / Treatments   ?Labs ?(all labs ordered are listed, but only abnormal results are displayed) ?Labs Reviewed  ?CBC WITH DIFFERENTIAL/PLATELET - Abnormal; Notable for the following components:  ?    Result Value  ? RBC 3.76 (*)   ? Hemoglobin 11.8 (*)   ? HCT 34.9 (*)   ? All other components within normal limits  ?BASIC METABOLIC PANEL - Abnormal; Notable for the following components:  ? Chloride 114 (*)   ? Calcium 8.4 (*)   ? Anion gap 4 (*)   ? All other components within normal limits  ? ? ?EKG ?None ? Date: 07/30/2021 ? Rate: 65 ? Rhythm: normal sinus rhythm ? QRS Axis: normal ? Intervals: normal ? ST/T Wave abnormalities: normal ? Conduction Disutrbances: none ? Narrative Interpretation:  ? Old EKG Reviewed: No significant changes noted ? ? ? ?Radiology ?MR BRAIN WO CONTRAST ? ?Result Date: 07/30/2021 ?CLINICAL DATA:  Neuro deficit, stroke suspected. EXAM: MRI HEAD WITHOUT CONTRAST TECHNIQUE: Multiplanar, multiecho pulse sequences of the brain and surrounding structures were obtained without intravenous contrast. COMPARISON:  CT head 01/12/2021, MR head 04/05/2020 FINDINGS: Brain: There is no acute intracranial hemorrhage, extra-axial fluid collection, or acute infarct. Parenchymal volume is normal. The ventricles are normal in size. Gray-white differentiation is preserved. There are patchy foci of FLAIR signal  abnormality in the subcortical and periventricular white matter likely reflecting sequela of mild chronic white matter microangiopathy, not significantly changed since 2021. An enlarged and empty sella is noted, unchanged since 2021. There is no suspicious parenchymal signal abnormality. There is no mass lesion. There is no mass effect or midline shift. Vascular: Normal flow voids. Skull and upper cervical spine: Normal marrow signal. Sinuses/Orbits: The paranasal sinuses are clear. The globes and orbits are unremarkable. Other: None. IMPRESSION: No acute intracranial pathology. Unchanged mild chronic white matter microangiopathy. Electronically Signed   By: Valetta Mole M.D.   On: 07/30/2021 14:06  ? ?ECHOCARDIOGRAM COMPLETE ? ?Result Date: 07/30/2021 ?   ECHOCARDIOGRAM REPORT   Patient Name:   Heather Carey  Date of Exam: 07/30/2021 Medical Rec #:  [id number]     Height:       65.5 in Accession #:    [id number]    Weight:       194.0 lb  Date of Birth:  Oct 21, 1950     BSA:          1.964 m? Patient Age:    17 years      BP:           150/88 mmHg Patient Gender: F             HR:           70 bpm. Exam Location:  [address] Procedure: 2D Echo, 3D Echo, Cardiac Doppler, Color Doppler, Strain Analysis and            Intracardiac Opacification Agent Indications:    R00.2 Shortnes of breath  History:        Patient has prior history of Echocardiogram examinations, most                 recent 07/24/2020. Stroke, Mitral Valve Prolapse, Arrythmias:SVT,                 Signs/Symptoms:Shortness of Breath and Syncope; Risk                 Factors:Hypertension and Dyslipidemia. COVID-19.  Sonographer:    Basilia Jumbo BS, RDCS Referring Phys: [id number] [place]  1. Left ventricular ejection fraction, by estimation, is 55 to 60%. Left ventricular ejection fraction by 3D volume is 56 %. The left ventricle has normal function. The left ventricle has no regional wall motion abnormalities. Left ventricular diastolic   parameters were normal. The average left ventricular global longitudinal strain is -24.6 %. The global longitudinal strain is normal.  2. Normal RV free wall strain. Right ventricular systolic function is normal. The

## 2021-07-30 NOTE — ED Triage Notes (Signed)
Via EMS from [facility]. Per EMS/pt she was having ECHO performed, DEFINITY med was given and pt had reaction after given--s/s hallucinations, throat tightness, heavy chested. 25mg  IV Benadryl given at facility, some s/s resolved. Pt still states heavy chested and seeing "black spots and yellow spider webs". Denies SOB/throat swelling. GCS 15 ?

## 2021-07-30 NOTE — ED Notes (Signed)
To MRI

## 2021-07-30 NOTE — Progress Notes (Signed)
Patient had reaction to Definity image enhancement for echocardiograms. DOD (Dr. Eden Emms) was notified. He advised to monitor for symptoms. Symptoms did not resolve. EMS called to transport to the emergency department at [facility]. ?

## 2021-07-30 NOTE — ED Notes (Signed)
Back from MRI.

## 2021-07-30 NOTE — Discharge Instructions (Signed)
You have symptoms likely due to an allergic reaction to Perflutren.  Fortunately your brain MRI did not show any signs of a stroke.  Please follow-up with your doctor for further care.  Return if his symptoms worsen or if you have any other concern.  Take Tylenol as needed for headache. ?

## 2021-08-02 ENCOUNTER — Telehealth: Payer: Self-pay | Admitting: *Deleted

## 2021-08-02 NOTE — Telephone Encounter (Signed)
-----   Message from Sheilah Pigeon, [place] sent at 07/28/2021  9:55 AM EDT ----- ?No slow HRs or findings  to explain her fatigue or symptoms. ?She had some SVT though very brief. ?Keep her appt with her PMD  ?Await her echo ?

## 2021-08-02 NOTE — Telephone Encounter (Signed)
Spoke with patient aware of results and verbalized understanding.Patient follows up with Primary 08-2021 ?

## 2021-08-18 NOTE — Progress Notes (Deleted)
Cardiology Office Note Date:  08/18/2021  Patient ID:  Heather, Carey 06/04/1950, MRN [id number] PCP:  Sigmund Hazel, MD  Cardiologist:  Dr. Mayford Knife Electrophysiologist: Dr. Ladona Ridgel    Chief Complaint:  *** f/u on monitoring  History of Present Illness: Heather Carey is a 71 y.o. 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 Ladona Ridgel, last seen by him Nov 2022, at that time discussed SOB/DOE.  Some SVTs terminated by vagal maneuvers, she requested loop removal. Mentioned that she had not had any AF on the monitor and the loop was removed, discussed return if recurrent SVT's/unable to control them  The patient recently called with palpitations, nausea and requested an appt  I saw her 07/13/21 She not feeling great. Mentions she had Covid last summer, and again 39mo ago, with the Flu as well this winter, since her last COVID infection she has never felt well again. Feels exhausted all of the time, no energy She called her PMD though unable to get in until May She is SOB with minimal exertion, and feels her heart pounding in her chest.  This is NOT her SVT. It is not necessarily fast but pounding in her chest. She has had some of her SVT, had some last weekend. But she is worried with a couple covid illnesses now that she has had some heart damage. No near syncope or syncope, no CP. No resting pounding or SOB Planned for an echo and monitor  Monitor noted no bradycardia to explain her fatigue, she had some very brief SVT only, echo noted preserved LVEF with no WMA, no significant VHD., and no atrial level shunt detected  No changes were recommended   NOTE: that she apparently had some kind of reaction to definity, transported to ER via EMS ER note reports she developed throbbing headache with pressure behind both eyes, floaters and black spots from both eyes right  greater than left as well as a yellow tinge of color in her visual field.  She also reported feeling very shaky.  Cardiologist did order IV fluid and 25 mg of Benadryl and sent patient here.  Since then she endorsed improvement of her shakiness but still endorse some mild headache and visual changes Labs unremarkable MRI r/o stroke, pt feeling better, monitored for several hours and discharged from the ER Recommended PMD follow up  Noted 23 allergies, many with significant reactions reported including anaphylaxis, hives, rash  *** symptoms *** no new meds needed, many allergies..... *** PMD for long haul covid ?  Past Medical History:  Diagnosis Date   Diverticulitis    Eosinophilic esophagitis    Fibromyalgia    GERD (gastroesophageal reflux disease)    Hyperlipidemia    Hypothyroidism    IBS (irritable bowel syndrome)    Migraines    Mitral regurgitation 02/28/2014   Mild to moderate by echo 02/2014   Obesity    Osteoarthritis    Otitis externa    Palpitations    TIA (transient ischemic attack)     Past Surgical History:  Procedure Laterality Date   ABDOMINAL HYSTERECTOMY     carpel tunnel     lbp gso     LOOP RECORDER INSERTION N/A 05/08/2017   Procedure: LOOP RECORDER INSERTION;  Surgeon: Marinus Maw, MD;  Location: [facility]VAS[facility];  Service: Cardiovascular;  Laterality: N/A;   TONSILLECTOMY AND ADENOIDECTOMY  Current Outpatient Medications  Medication Sig Dispense Refill   acetaminophen (TYLENOL) 500 MG tablet Take 1,000 mg by mouth 2 (two) times daily as needed (migraine headaches).     diphenhydrAMINE (BENADRYL) 12.5 MG chewable tablet Chew 25 mg by mouth 2 (two) times daily as needed (allergic reaction).     diphenhydrAMINE (BENADRYL) 2 % cream Apply 1 application topically daily as needed for itching (allergic reactions).     EPINEPHrine 0.3 mg/0.3 mL IJ SOAJ injection Inject 0.3 mg into the muscle as needed for anaphylaxis. 2 each 0   hydrocortisone  cream 0.5 % Apply 1 application. topically 2 (two) times daily as needed for itching (bug bites).     levothyroxine (SYNTHROID) 88 MCG tablet Take 88 mcg by mouth daily before breakfast.     metoprolol succinate (TOPROL-XL) 25 MG 24 hr tablet TAKE 1/2 TABLET (12.5 MG TOTAL) BY MOUTH 2 (TWO) TIMES DAILY. 90 tablet 3   No current facility-administered medications for this visit.    Allergies:   Azithromycin, Codeine, Ivp dye [iodinated contrast media], Natamycin, Other, Rose, Shellfish allergy, Thimerosol [thimerosal], Adhesive [tape], Avelox [moxifloxacin hcl in nacl], Bacitracin, Cinnamon, Definity [perflutren lipid microsphere], Doxycycline, Ginger, Neomycin, Onion, Raspberry, Sunscreens [aminobenzoate], Watermelon [citrullus vulgaris], Benzalkonium chloride, Cephalosporins, and Penicillins   Social History:  The patient  reports that she has never smoked. She has never used smokeless tobacco. She reports that she does not currently use alcohol after a past usage of about 1.0 standard drink per week. She reports that she does not use drugs.   Family History:  The patient's family history includes Allergic rhinitis in her brother, father, mother, and sister; Angioedema in her sister; Asthma in her brother and father; Urticaria in her mother and sister.  ROS:  Please see the history of present illness.    All other systems are reviewed and otherwise negative.   PHYSICAL EXAM:  VS:  There were no vitals taken for this visit. BMI: There is no height or weight on file to calculate BMI. Well nourished, well developed, in no acute distress HEENT: normocephalic, atraumatic Neck: no JVD, carotid bruits or masses Cardiac:   *** RRR; no significant murmurs, no rubs, or gallops Lungs:  *** CTA b/l, no wheezing, rhonchi or rales Abd: soft, nontender MS: no deformity or atrophy Ext: *** no edema Skin: warm and dry, no rash Neuro:  No gross deficits appreciated Psych: euthymic mood, full  affect    EKG:  not done today   07/24/2020: TTE  1. Left ventricular ejection fraction, by estimation, is 60 to 65%. The  left ventricle has normal function. The left ventricle has no regional  wall motion abnormalities. Left ventricular diastolic parameters were  normal.   2. Right ventricular systolic function is normal. The right ventricular  size is normal.   3. The mitral valve is normal in structure. Mild mitral valve  regurgitation.   4. The aortic valve is tricuspid. Aortic valve regurgitation is not  visualized. Mild aortic valve sclerosis is present, with no evidence of  aortic valve stenosis.   5. The inferior vena cava is normal in size with greater than 50%  respiratory variability, suggesting right atrial pressure of 3 mmHg.   Comparison(s): The left ventricular function is unchanged.    06/24/2019: stress myoview Nuclear stress EF: 64%. There was no ST segment deviation noted during stress. The study is normal. This is a low risk study. The left ventricular ejection fraction is normal (55-65%).   Normal  pharmacologic nuclear study with no evidence for prior infarct or ischemia. Normal LVEF.     Recent Labs: 03/04/2021: ALT 20 07/13/2021: TSH 2.130 07/30/2021: BUN 14; Creatinine, Ser 0.91; Hemoglobin 11.8; Platelets 186; Potassium 3.9; Sodium 141  No results found for requested labs within last 8760 hours.   CrCl cannot be calculated (Unknown ideal weight.).   Wt Readings from Last 3 Encounters:  07/30/21 194 lb 0.1 oz (88 kg)  07/13/21 194 lb (88 kg)  02/18/21 197 lb 6.4 oz (89.5 kg)     Other studies reviewed: Additional studies/records reviewed today include: summarized above  ASSESSMENT AND PLAN:  SVT ***  HTN ***  3.   Palpitations, SOB, post COVID a couple months ago ***  Disposition: ***  Current medicines are reviewed at length with the patient today.  The patient did not have any concerns regarding medicines.  Venetia Night,  PA-C 08/18/2021 1:59 PM     [place] [place] [address] [place] [address] [phone] (office)  [phone] (fax)

## 2021-08-23 ENCOUNTER — Ambulatory Visit: Payer: Medicare Other | Admitting: Physician Assistant

## 2021-08-27 DIAGNOSIS — J0100 Acute maxillary sinusitis, unspecified: Secondary | ICD-10-CM | POA: Diagnosis not present

## 2021-08-30 DIAGNOSIS — I10 Essential (primary) hypertension: Secondary | ICD-10-CM | POA: Diagnosis not present

## 2021-08-30 DIAGNOSIS — I471 Supraventricular tachycardia: Secondary | ICD-10-CM | POA: Diagnosis not present

## 2021-08-30 DIAGNOSIS — I700 Atherosclerosis of aorta: Secondary | ICD-10-CM | POA: Diagnosis not present

## 2021-08-30 DIAGNOSIS — Z8673 Personal history of transient ischemic attack (TIA), and cerebral infarction without residual deficits: Secondary | ICD-10-CM | POA: Diagnosis not present

## 2021-08-30 DIAGNOSIS — E039 Hypothyroidism, unspecified: Secondary | ICD-10-CM | POA: Diagnosis not present

## 2021-08-30 DIAGNOSIS — R5383 Other fatigue: Secondary | ICD-10-CM | POA: Diagnosis not present

## 2021-09-08 DIAGNOSIS — Z8673 Personal history of transient ischemic attack (TIA), and cerebral infarction without residual deficits: Secondary | ICD-10-CM | POA: Diagnosis not present

## 2021-09-08 DIAGNOSIS — I471 Supraventricular tachycardia: Secondary | ICD-10-CM | POA: Diagnosis not present

## 2021-09-26 ENCOUNTER — Emergency Department (HOSPITAL_BASED_OUTPATIENT_CLINIC_OR_DEPARTMENT_OTHER)
Admission: EM | Admit: 2021-09-26 | Discharge: 2021-09-27 | Disposition: A | Discharge status: Home / Self Care | Payer: Medicare Other | Attending: Emergency Medicine | Admitting: Emergency Medicine

## 2021-09-26 ENCOUNTER — Other Ambulatory Visit: Payer: Self-pay

## 2021-09-26 ENCOUNTER — Encounter (HOSPITAL_BASED_OUTPATIENT_CLINIC_OR_DEPARTMENT_OTHER): Payer: Self-pay | Admitting: *Deleted

## 2021-09-26 DIAGNOSIS — T7840XA Allergy, unspecified, initial encounter: Secondary | ICD-10-CM | POA: Diagnosis not present

## 2021-09-26 DIAGNOSIS — S50362A Insect bite (nonvenomous) of left elbow, initial encounter: Secondary | ICD-10-CM | POA: Insufficient documentation

## 2021-09-26 DIAGNOSIS — Z91038 Other insect allergy status: Secondary | ICD-10-CM

## 2021-09-26 DIAGNOSIS — W57XXXA Bitten or stung by nonvenomous insect and other nonvenomous arthropods, initial encounter: Secondary | ICD-10-CM | POA: Diagnosis not present

## 2021-09-26 MED ORDER — FAMOTIDINE IN NACL 20-0.9 MG/50ML-% IV SOLN
20.0000 mg | Freq: Once | INTRAVENOUS | Status: AC
  Administered 2021-09-26: 20 mg via INTRAVENOUS
  Filled 2021-09-26: qty 50

## 2021-09-26 MED ORDER — DEXAMETHASONE SODIUM PHOSPHATE 10 MG/ML IJ SOLN
10.0000 mg | Freq: Once | INTRAMUSCULAR | Status: AC
  Administered 2021-09-26: 10 mg via INTRAVENOUS
  Filled 2021-09-26: qty 1

## 2021-09-26 MED ORDER — FAMOTIDINE 20 MG PO TABS: 20.0000 mg | ORAL_TABLET | Freq: Two times a day (BID) | ORAL | 0 refills | Status: DC

## 2021-09-26 MED ORDER — PREDNISONE 50 MG PO TABS: 50.0000 mg | ORAL_TABLET | Freq: Every day | ORAL | 0 refills | Status: DC

## 2021-09-26 MED ORDER — EPINEPHRINE 0.3 MG/0.3ML IJ SOAJ: 0.3000 mg | INTRAMUSCULAR | 0 refills | Status: DC | PRN

## 2021-09-26 NOTE — ED Triage Notes (Addendum)
Pt states she was stung by a bee one hour ago times one to her left elbow. States she had swelling to her tongue and lips. States she had a rash on her abdomen. States she felt like she was having SVT. States she took 25mg  of benadryl and states this has helped a little. States she feels like her tongue is less swollen. C/o of feeling a little sob. States she thinks bees were listed on her allergy test as a possible allergy

## 2021-09-26 NOTE — ED Notes (Signed)
ED Provider at bedside. 

## 2021-09-26 NOTE — ED Provider Notes (Signed)
[facility] EMERGENCY DEPT Provider Note   CSN: [id number] Arrival date & time: 09/26/21  2146     History  Chief Complaint  Patient presents with   Allergic Reaction    Heather Carey is a 71 y.o. female.   Allergic Reaction   Patient presented to the ER for evaluation of allergic reaction.  Patient has a history of multiple medication allergies.  She is not sure if she had a bee sting allergy.  Patient states she was stung by a wasp earlier this evening.  She is started developing redness around her left elbow.  Patient took Benadryl but she then started to feel swelling around her lips and eyes.  She also noticed a red rash around her abdomen.  Patient also has history of SVT and felt that her heart started to race.  Patient is actually feeling somewhat better now upon arrival to the ED.  Home Medications Prior to Admission medications   Medication Sig Start Date End Date Taking? Authorizing Provider  acetaminophen (TYLENOL) 500 MG tablet Take 1,000 mg by mouth 2 (two) times daily as needed (migraine headaches).    [provider]  diphenhydrAMINE (BENADRYL) 12.5 MG chewable tablet Chew 25 mg by mouth 2 (two) times daily as needed (allergic reaction).    [provider]  diphenhydrAMINE (BENADRYL) 2 % cream Apply 1 application topically daily as needed for itching (allergic reactions).    [provider]  EPINEPHrine 0.3 mg/0.3 mL IJ SOAJ injection Inject 0.3 mg into the muscle as needed for anaphylaxis. 03/04/21   Derwood Kaplan, MD  hydrocortisone cream 0.5 % Apply 1 application. topically 2 (two) times daily as needed for itching (bug bites).    [provider]  levothyroxine (SYNTHROID) 88 MCG tablet Take 88 mcg by mouth daily before breakfast.    [provider]  metoprolol succinate (TOPROL-XL) 25 MG 24 hr tablet TAKE 1/2 TABLET (12.5 MG TOTAL) BY MOUTH 2 (TWO) TIMES DAILY. 01/19/21   Marinus Maw, MD       Allergies    Azithromycin, Codeine, Ivp dye [iodinated contrast media], Natamycin, Other, Rose, Shellfish allergy, Thimerosol [thimerosal], Adhesive [tape], Avelox [moxifloxacin hcl in nacl], Bacitracin, Cinnamon, Definity [perflutren lipid microsphere], Doxycycline, Ginger, Neomycin, Onion, Raspberry, Sunscreens [aminobenzoate], Watermelon [citrullus vulgaris], Benzalkonium chloride, Cephalosporins, and Penicillins    Review of Systems   Review of Systems  Constitutional:  Negative for fever.    Physical Exam Updated Vital Signs BP (!) 138/100 (BP Location: Right Arm)   Pulse (!) 107   Temp 98.3 F (36.8 C) (Oral)   Resp 20   Ht 1.651 m (5\' 5" )   Wt 86.2 kg   SpO2 100%   BMI 31.62 kg/m  Physical Exam Vitals and nursing note reviewed.  Constitutional:      Appearance: She is well-developed. She is not diaphoretic.  HENT:     Head: Normocephalic and atraumatic.     Comments: No oropharyngeal swelling, no angioedema    Right Ear: External ear normal.     Left Ear: External ear normal.  Eyes:     General: No scleral icterus.       Right eye: No discharge.        Left eye: No discharge.     Conjunctiva/sclera: Conjunctivae normal.  Neck:     Trachea: No tracheal deviation.  Cardiovascular:     Rate and Rhythm: Normal rate and regular rhythm.  Pulmonary:     Effort: Pulmonary effort is normal.  No respiratory distress.     Breath sounds: Normal breath sounds. No stridor. No wheezing or rales.  Abdominal:     General: Bowel sounds are normal. There is no distension.     Palpations: Abdomen is soft.     Tenderness: There is no abdominal tenderness. There is no guarding or rebound.  Musculoskeletal:        General: No tenderness or deformity.     Cervical back: Neck supple.  Skin:    General: Skin is warm and dry.     Findings: No erythema or rash.     Comments: No urticaria noted, no rash noted on the abdomen now, small area of erythema around the left elbow at the site  of the insect sting  Neurological:     General: No focal deficit present.     Mental Status: She is alert.     Cranial Nerves: No cranial nerve deficit (no facial droop, extraocular movements intact, no slurred speech).     Sensory: No sensory deficit.     Motor: No abnormal muscle tone or seizure activity.     Coordination: Coordination normal.  Psychiatric:        Mood and Affect: Mood normal.     ED Results / Procedures / Treatments   Labs (all labs ordered are listed, but only abnormal results are displayed) Labs Reviewed - No data to display  EKG None  Radiology No results found.  Procedures .1-3 Lead EKG Interpretation  Performed by: Linwood Dibbles, MD Authorized by: Linwood Dibbles, MD     Interpretation: normal     ECG rate:  78   ECG rate assessment: normal     Rhythm: sinus rhythm     Ectopy: none     Conduction: normal       Medications Ordered in ED Medications  dexamethasone (DECADRON) injection 10 mg (has no administration in time range)  famotidine (PEPCID) IVPB 20 mg premix (has no administration in time range)    ED Course/ Medical Decision Making/ A&P Clinical Course as of 09/26/21 2335  Sun Sep 26, 2021  2333 Patient remained stable.  No recurrent tachycardia.  No recurrent urticaria, facial swelling or difficulty breathing [JK]    Clinical Course User Index [JK] Linwood Dibbles, MD                           Medical Decision Making Risk Prescription drug management.   Patient presented to the ED for evaluation of probable allergic reaction to bee sting.  Patient states she developed facial swelling as well as a rash on her abdomen.  Although symptoms resolved prior to ED evaluation after taking antihistamines at home.  Patient was monitored in the ED and she had no recurrent swelling.  Patient also felt like she may have gone into SVT earlier at home.  Her EKG shows a normal cardiac rhythm and she has had no recurrent tachycardia in the emergency  department.  Heart rate at the bedside was in the 70s.  Patient has been monitored at this time appears stable for discharge.  We will have her go home on a course of steroids and antihistamines.  I will give a refill on her EpiPen       Final Clinical Impression(s) / ED Diagnoses Final diagnoses:  None    Rx / DC Orders ED Discharge Orders     None         Lynelle Doctor,  Cletis Athens, MD 09/26/21 [id number]

## 2021-09-26 NOTE — Discharge Instructions (Addendum)
Continue your antihistamines.  Take the prednisone and Pepcid until complete.  Make sure to carry the EpiPen around with you in case of any recurrent bee stings.

## 2021-09-27 NOTE — ED Notes (Signed)
Pt agreeable with d/c plan as discussed by provider- this nurse has verbally reinforced d/c instructions and provided pt with written copy - pt acknowledges verbal understanding and denies any additional questions, concerns, needs- ambulatory independently at d/c with steady gait; vitals stable; no distress- escorted by spouse  

## 2021-10-07 DIAGNOSIS — I10 Essential (primary) hypertension: Secondary | ICD-10-CM | POA: Diagnosis not present

## 2021-10-07 DIAGNOSIS — Z8673 Personal history of transient ischemic attack (TIA), and cerebral infarction without residual deficits: Secondary | ICD-10-CM | POA: Diagnosis not present

## 2021-10-07 DIAGNOSIS — I471 Supraventricular tachycardia: Secondary | ICD-10-CM | POA: Diagnosis not present

## 2021-10-07 DIAGNOSIS — G4733 Obstructive sleep apnea (adult) (pediatric): Secondary | ICD-10-CM | POA: Diagnosis not present

## 2021-11-01 ENCOUNTER — Telehealth: Payer: Self-pay | Admitting: Internal Medicine

## 2021-11-01 NOTE — Telephone Encounter (Signed)
Patient c/o Palpitations:  High priority if patient c/o lightheadedness, shortness of breath, or chest pain  How long have you had palpitations/irregular HR/ Afib? Are you having the symptoms now? Since  10/29/21 and no palpitations right now  Are you currently experiencing lightheadedness, SOB or CP? no  Do you have a history of afib (atrial fibrillation) or irregular heart rhythm? no   Have you checked your BP or HR? (document readings if available): no  Are you experiencing any other symptoms? Pt called stating on Friday 10/30/21 she felt palpitations in her chest. She states she had shivers, shaking, and felt ice cold, She states that she put her feet up and just rested for the rest of the day.  On Saturday it happened again in the evening with the same symptoms. Sunday and this morning she feels very weak and wiped out.

## 2021-11-01 NOTE — Telephone Encounter (Signed)
Rescheduled follow up with RU.  Previously cancelled by Pt d/t sickness.  See mychart thread for additional information regarding symptoms.

## 2021-11-04 ENCOUNTER — Encounter: Payer: Self-pay | Admitting: Physician Assistant

## 2021-11-04 ENCOUNTER — Ambulatory Visit (INDEPENDENT_AMBULATORY_CARE_PROVIDER_SITE_OTHER): Payer: Medicare Other

## 2021-11-04 ENCOUNTER — Ambulatory Visit: Payer: Medicare Other | Admitting: Physician Assistant

## 2021-11-04 VITALS — BP 118/68 | HR 70 | Ht 65.5 in | Wt 195.2 lb

## 2021-11-04 DIAGNOSIS — R0602 Shortness of breath: Secondary | ICD-10-CM | POA: Diagnosis not present

## 2021-11-04 DIAGNOSIS — R55 Syncope and collapse: Secondary | ICD-10-CM

## 2021-11-04 DIAGNOSIS — I10 Essential (primary) hypertension: Secondary | ICD-10-CM

## 2021-11-04 DIAGNOSIS — M79604 Pain in right leg: Secondary | ICD-10-CM | POA: Diagnosis not present

## 2021-11-04 DIAGNOSIS — I471 Supraventricular tachycardia: Secondary | ICD-10-CM

## 2021-11-04 DIAGNOSIS — H53121 Transient visual loss, right eye: Secondary | ICD-10-CM | POA: Diagnosis not present

## 2021-11-04 DIAGNOSIS — M79605 Pain in left leg: Secondary | ICD-10-CM | POA: Diagnosis not present

## 2021-11-04 LAB — D-DIMER, QUANTITATIVE: D-DIMER: 0.37 mg/L FEU (ref 0.00–0.49)

## 2021-11-04 NOTE — Progress Notes (Signed)
Cardiology Office Note Date:  11/04/2021  Patient ID:  Heather Carey, Carey Jul 30, 1950, MRN [id number] PCP:  Sigmund Hazel, MD  Cardiologist:  Dr. Mayford Knife Electrophysiologist: Dr. Ladona Ridgel    Chief Complaint:  c/o fatigue, palpitations  History of Present Illness: Heather Carey Carey is a 71 y.o. 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 Ladona Ridgel, last seen by him Nov 2022, at that time discussed SOB/DOE.  Some SVTs terminated by vagal maneuvers, she requested loop removal. Mentioned that she had not had any AF on the monitor and the loop was removed, discussed return if recurrent SVT's/unable to control them  The patient recently called with palpitations, nausea and requested an appt  I saw her 07/13/21 She not feeling great. Mentions she had Covid last summer, and again 49mo ago, with the Flu as well this winter, since her last COVID infection she has never felt well again. Feels exhausted all of the time, no energy She called her PMD though unable to get in until May She is SOB with minimal exertion, and feels her heart pounding in her chest.  This is NOT her SVT. It is not necessarily fast but pounding in her chest. She has had some of her SVT, had some last weekend. But she is worried with a couple covid illnesses now that she has had some heart damage. No near syncope or syncope, no CP. No resting pounding or SOB Planned for an echo and monitoring  She had her echo done 07/30/21, suffered what was thought perhaps allergic reaction to the difinity,  c/o throbbing headache with pressure behind both eyes, floaters and black spots from both eyes right greater than left as well as a yellow tinge of color in her visual field.  She also reported feeling very shaky.  Cardiologist did order IV fluid and 25 mg of Benadryl ultimately referred to the ER Neuro recommended MRI which  was negative for stroke, labs felt reassuring, monitored for several hours, feeing improved, discharged from the ER  Monitor was unrevealing Echo noted *LVEF 55-60%, no WMA, no significant VHD.  Was observed towards the end of the study with reports of possible allergic reaction her HR > 150 with associated rise in EF to 70-75% and then returned to baseline  Recommended to f/u with her PMD to discuss perhaps long haul covid as a possible etiology of her symptoms  ER 09/26/21 with concerns of allergic reaction after a wasp sting, developed localized swelling, itchy eyes, mouth, and rash on her belly, as well as palpitations, she was given decadron, pepcid, monitored, remained stable and discharged from the ER  TODAY She is recently back from a 3 week vacation, involved a few long road trips to [facility] and other places, did a lot of walking, activities and felt well. Over the weekend last week though once back 2x she thought she was dying. Both events occurred seated, got a sudden weakness that overcame her, followed by feeling ice cold, heart then felt like it was irregular and pounding, felt shaky, laid done and slowly feeling better but took several hours to feel recovered. She did not happen to check her BP or HR. Recalls telling her husband that it felt like she was dying.  No CP but a sense perhaps of heaviness with the events None otherwise  No SOB  She feels like she generally keeps well hydrated.  Past Medical History:  Diagnosis Date   Diverticulitis    Eosinophilic esophagitis    Fibromyalgia    GERD (gastroesophageal reflux disease)    Hyperlipidemia    Hypothyroidism    IBS (irritable bowel syndrome)    Migraines    Mitral regurgitation 02/28/2014   Mild to moderate by echo 02/2014   Obesity    Osteoarthritis    Otitis externa    Palpitations    TIA (transient ischemic attack)     Past Surgical History:  Procedure Laterality Date   ABDOMINAL HYSTERECTOMY      carpel tunnel     lbp gso     LOOP RECORDER INSERTION N/A 05/08/2017   Procedure: LOOP RECORDER INSERTION;  Surgeon: Marinus Maw, MD;  Location: [facility] INVASIVE CV LAB;  Service: Cardiovascular;  Laterality: N/A;   TONSILLECTOMY AND ADENOIDECTOMY      Current Outpatient Medications  Medication Sig Dispense Refill   acetaminophen (TYLENOL) 500 MG tablet Take 1,000 mg by mouth 2 (two) times daily as needed (migraine headaches).     diphenhydrAMINE (BENADRYL) 12.5 MG chewable tablet Chew 25 mg by mouth 2 (two) times daily as needed (allergic reaction).     diphenhydrAMINE (BENADRYL) 2 % cream Apply 1 application topically daily as needed for itching (allergic reactions).     EPINEPHrine 0.3 mg/0.3 mL IJ SOAJ injection Inject 0.3 mg into the muscle as needed for anaphylaxis. 2 each 0   famotidine (PEPCID) 20 MG tablet Take 1 tablet (20 mg total) by mouth 2 (two) times daily for 5 days. 10 tablet 0   hydrocortisone cream 0.5 % Apply 1 application. topically 2 (two) times daily as needed for itching (bug bites).     levothyroxine (SYNTHROID) 88 MCG tablet Take 88 mcg by mouth daily before breakfast.     metoprolol succinate (TOPROL-XL) 25 MG 24 hr tablet TAKE 1/2 TABLET (12.5 MG TOTAL) BY MOUTH 2 (TWO) TIMES DAILY. 90 tablet 3   No current facility-administered medications for this visit.    Allergies:   Azithromycin, Codeine, Ivp dye [iodinated contrast media], Natamycin, Other, Rose, Shellfish allergy, Thimerosol [thimerosal], Adhesive [tape], Avelox [moxifloxacin hcl in nacl], Bacitracin, Bacitracin-polymyxin b, Definity [perflutren lipid microsphere], Doxycycline, Ginger, Neomycin, Onion, Raspberry, Sunscreens, Sunscreens [aminobenzoate], Watermelon [citrullus vulgaris], Benzalkonium chloride, Cephalosporins, and Penicillins   Social History:  The patient  reports that she has never smoked. She has never used smokeless tobacco. She reports that she does not currently use alcohol after a past  usage of about 1.0 standard drink of alcohol per week. She reports that she does not use drugs.   Family History:  The patient's family history includes Allergic rhinitis in her brother, father, mother, and sister; Angioedema in her sister; Asthma in her brother and father; Urticaria in her mother and sister.  ROS:  Please see the history of present illness.    All other systems are reviewed and otherwise negative.   PHYSICAL EXAM:  VS:  BP 118/68   Pulse 70   Ht 5' 5.5" (1.664 m)   Wt 195 lb 3.2 oz (88.5 kg)   SpO2 97%   BMI 31.99 kg/m  BMI: Body mass index is 31.99 kg/m. Well nourished, well developed, in no acute distress HEENT: normocephalic, atraumatic Neck: no JVD, carotid bruits or masses Cardiac:  RRR; no significant murmurs, no rubs, or gallops Lungs:  CTA b/l, no wheezing, rhonchi or rales Abd: soft, nontender MS: no deformity or atrophy Ext: no edema Skin: warm and dry,  no rash Neuro:  No gross deficits appreciated Psych: euthymic mood, full affect    EKG:  not done today  April 2023 1. NSR with sinus brady and sinus tachycardia 2. Brief episodes of SVT, longest 33 seconds at 150/min 3. Rare PVC's and PAC's 4. No prolonged pauses  07/30/21; TTE 1. Left ventricular ejection fraction, by estimation, is 55 to 60%. Left  ventricular ejection fraction by 3D volume is 56 %. The left ventricle has  normal function. The left ventricle has no regional wall motion  abnormalities. Left ventricular diastolic   parameters were normal. The average left ventricular global longitudinal  strain is -24.6 %. The global longitudinal strain is normal.   2. Normal RV free wall strain. Right ventricular systolic function is  normal. The right ventricular size is normal. There is normal pulmonary  artery systolic pressure.   3. The mitral valve is abnormal. Mild to moderate mitral valve  regurgitation.   4. The aortic valve is tricuspid. Aortic valve regurgitation is not   visualized.   5. The inferior vena cava is normal in size with greater than 50%  respiratory variability, suggesting right atrial pressure of 3 mmHg.   Comparison(s): 07/24/20 EF 60-65%.   Conclusion(s)/Recommendation(s): At the end of the study, the sonographer  noted that the patient was thought to have had a reaction to Definity -  further imaging was obtained- it appears the patient an SVT during the  study with a HR of around 157 that  subsequently broke - LVEF increased to 70-75% during this time and  returned to normal.   07/24/2020: TTE  1. Left ventricular ejection fraction, by estimation, is 60 to 65%. The  left ventricle has normal function. The left ventricle has no regional  wall motion abnormalities. Left ventricular diastolic parameters were  normal.   2. Right ventricular systolic function is normal. The right ventricular  size is normal.   3. The mitral valve is normal in structure. Mild mitral valve  regurgitation.   4. The aortic valve is tricuspid. Aortic valve regurgitation is not  visualized. Mild aortic valve sclerosis is present, with no evidence of  aortic valve stenosis.   5. The inferior vena cava is normal in size with greater than 50%  respiratory variability, suggesting right atrial pressure of 3 mmHg.   Comparison(s): The left ventricular function is unchanged.    06/24/2019: stress myoview Nuclear stress EF: 64%. There was no ST segment deviation noted during stress. The study is normal. This is a low risk study. The left ventricular ejection fraction is normal (55-65%).   Normal pharmacologic nuclear study with no evidence for prior infarct or ischemia. Normal LVEF.     Recent Labs: 03/04/2021: ALT 20 07/13/2021: TSH 2.130 07/30/2021: BUN 14; Creatinine, Ser 0.91; Hemoglobin 11.8; Platelets 186; Potassium 3.9; Sodium 141  No results found for requested labs within last 365 days.   CrCl cannot be calculated (Patient's most recent lab result  is older than the maximum 21 days allowed.).   Wt Readings from Last 3 Encounters:  11/04/21 195 lb 3.2 oz (88.5 kg)  09/26/21 190 lb (86.2 kg)  07/30/21 194 lb 0.1 oz (88 kg)     Other studies reviewed: Additional studies/records reviewed today include: summarized above  ASSESSMENT AND PLAN:  SVT No symptoms of her SVT  HTN Initial BP looked great, on the lower side today with her orthostatics   3.   Palpitations, SOB, post COVID a couple months ago These  are improved  4. New recurrent near syncope She recalls similar episodes perhaps last year or so, again with travel Orthostatics unrevealing Given her allergies will try to avoid scans Will get Ddimer, LE venous US though symptoms did not sound of PE, exam does not suggest DVT Will go ahead and have her again wear a monitor with a new symptom of near syncope  Disposition: back in   Current medicines are reviewed at length with the patient today.  The patient did not have any concerns regarding medicines.  Norma Fredrickson, PA-C 11/04/2021 4:48 PM     [facility] [address] [place] [address] [phone] (office)  [phone] (fax)

## 2021-11-04 NOTE — Patient Instructions (Addendum)
Medication Instructions:  Your physician recommends that you continue on your current medications as directed. Please refer to the Current Medication list given to you today.  *If you need a refill on your cardiac medications before your next appointment, please call your pharmacy*   Lab Work: TODAY: D-Dimer  If you have labs (blood work) drawn today and your tests are completely normal, you will receive your results only by: [place] (if you have MyChart) OR A paper copy in the mail If you have any lab test that is abnormal or we need to change your treatment, we will call you to review the results.   Testing/Procedures: Your physician has requested that you have a lower extremity venous duplex. This test is an ultrasound of the veins in the legs or arms. It looks at venous blood flow that carries blood from the heart to the legs or arms. Allow one hour for a Lower Venous exam. There are no restrictions or special instructions.   Follow-Up: At [facility], you and your health needs are our priority.  As part of our continuing mission to provide you with exceptional heart care, we have created designated Provider Care Teams.  These Care Teams include your primary Cardiologist (physician) and Advanced Practice Providers (APPs -  Physician Assistants and Nurse Practitioners) who all work together to provide you with the care you need, when you need it.   Your next appointment:   12/14/21   ZIO AT Long term monitor-Live Telemetry  Your physician has requested you wear a ZIO patch monitor for 14 days.  This is a single patch monitor. [employer] supplies one patch monitor per enrollment. Additional  stickers are not available.  Please do not apply patch if you will be having a Nuclear Stress Test, Echocardiogram, Cardiac CT, MRI,  or Chest Xray during the period you would be wearing the monitor. The patch cannot be worn during  these tests. You cannot remove and re-apply the ZIO AT  patch monitor.  Your ZIO patch monitor will be mailed 3 day USPS to your address on file. It may take 3-5 days to  receive your monitor after you have been enrolled.  Once you have received your monitor, please review the enclosed instructions. Your monitor has  already been registered assigning a specific monitor serial # to you.   Billing and Patient Assistance Program information  Heather Carey has been supplied with any insurance information on record for billing. [employer] offers a sliding scale Patient Assistance Program for patients without insurance, or whose  insurance does not completely cover the cost of the ZIO patch monitor. You must apply for the  Patient Assistance Program to qualify for the discounted rate. To apply, call [employer] at [phone],  select option 4, select option 2 , ask to apply for the Patient Assistance Program, (you can request an  interpreter if needed). [employer] will ask your household income and how many people are in your  household. [employer] will quote your out-of-pocket cost based on this information. They will also be able  to set up a 12 month interest free payment plan if needed.  Applying the monitor   Shave hair from upper left chest.  Hold the abrader disc by orange tab. Rub the abrader in 40 strokes over left upper chest as indicated in  your monitor instructions.  Clean area with 4 enclosed alcohol pads. Use all pads to ensure the area is cleaned thoroughly. Let  dry.  Apply patch as indicated  in monitor instructions. Patch will be placed under collarbone on left side of  chest with arrow pointing upward.  Rub patch adhesive wings for 2 minutes. Remove the white label marked "1". Remove the white label  marked "2". Rub patch adhesive wings for 2 additional minutes.  While looking in a mirror, press and release button in center of patch. A small green light will flash 3-4  times. This will be your only indicator that the monitor has been turned on.   Do not shower for the first 24 hours. You may shower after the first 24 hours.  Press the button if you feel a symptom. You will hear a small click. Record Date, Time and Symptom in  the Patient Log.   Starting the Gateway  In your kit there is a Hydrographic surveyor box the size of a cellphone. This is Airline pilot. It transmits all your  recorded data to [facility]. This box must always stay within 10 feet of you. Open the box and push the *  button. There will be a light that blinks orange and then green a few times. When the light stops  blinking, the Gateway is connected to the ZIO patch. Call [employer] at [phone] to confirm your monitor is transmitting.  Returning your monitor  Remove your patch and place it inside the [place]. In the lower half of the Gateway there is a white  bag with prepaid postage on it. Place Gateway in bag and seal. Mail package back to [place] as soon as  possible. Your physician should have your final report approximately 7 days after you have mailed back  your monitor. Call [place] at [phone] if you have questions regarding your ZIO AT  patch monitor. Call them immediately if you see an orange light blinking on your monitor.  If your monitor falls off in less than 4 days, contact our Monitor department at [phone]. If your  monitor becomes loose or falls off after 4 days call [employer] at [phone] for suggestions on  securing your monitor

## 2021-11-04 NOTE — Progress Notes (Unsigned)
ZIO AT serial# [id number] from office inventory applied to patient.  Dr. Ladona Ridgel to read.

## 2021-11-05 DIAGNOSIS — R55 Syncope and collapse: Secondary | ICD-10-CM | POA: Diagnosis not present

## 2021-11-09 ENCOUNTER — Ambulatory Visit (HOSPITAL_COMMUNITY)
Admission: RE | Admit: 2021-11-09 | Discharge: 2021-11-09 | Disposition: A | Discharge status: Home / Self Care | Payer: Medicare Other | Source: Ambulatory Visit | Attending: Physician Assistant | Admitting: Physician Assistant

## 2021-11-09 DIAGNOSIS — M79605 Pain in left leg: Secondary | ICD-10-CM | POA: Diagnosis not present

## 2021-11-09 DIAGNOSIS — H43813 Vitreous degeneration, bilateral: Secondary | ICD-10-CM | POA: Diagnosis not present

## 2021-11-09 DIAGNOSIS — M79604 Pain in right leg: Secondary | ICD-10-CM | POA: Diagnosis not present

## 2021-11-24 DIAGNOSIS — M25562 Pain in left knee: Secondary | ICD-10-CM | POA: Diagnosis not present

## 2021-11-24 DIAGNOSIS — M1712 Unilateral primary osteoarthritis, left knee: Secondary | ICD-10-CM | POA: Diagnosis not present

## 2021-12-07 DIAGNOSIS — M7052 Other bursitis of knee, left knee: Secondary | ICD-10-CM | POA: Diagnosis not present

## 2021-12-07 DIAGNOSIS — M25562 Pain in left knee: Secondary | ICD-10-CM | POA: Diagnosis not present

## 2021-12-07 DIAGNOSIS — M1712 Unilateral primary osteoarthritis, left knee: Secondary | ICD-10-CM | POA: Diagnosis not present

## 2021-12-12 NOTE — Progress Notes (Unsigned)
Cardiology Office Note Date:  12/12/2021  Patient ID:  Heather, Carey 29-Jun-1950, MRN [id number] PCP:  Sigmund Hazel, MD  Cardiologist:  Dr. Mayford Knife Electrophysiologist: Dr. Ladona Ridgel    Chief Complaint:  c/o fatigue, palpitations  History of Present Illness: Heather Carey is a 71 y.o. 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 Ladona Ridgel, last seen by him Nov 2022, at that time discussed SOB/DOE.  Some SVTs terminated by vagal maneuvers, she requested loop removal. Mentioned that she had not had any AF on the monitor and the loop was removed, discussed return if recurrent SVT's/unable to control them  The patient recently called with palpitations, nausea and requested an appt  I saw her 07/13/21 She not feeling great. Mentions she had Covid last summer, and again 71mo ago, with the Flu as well this winter, since her last COVID infection she has never felt well again. Feels exhausted all of the time, no energy She called her PMD though unable to get in until May She is SOB with minimal exertion, and feels her heart pounding in her chest.  This is NOT her SVT. It is not necessarily fast but pounding in her chest. She has had some of her SVT, had some last weekend. But she is worried with a couple covid illnesses now that she has had some heart damage. No near syncope or syncope, no CP. No resting pounding or SOB Planned for an echo and monitoring  She had her echo done 07/30/21, suffered what was thought perhaps allergic reaction to the difinity,  c/o throbbing headache with pressure behind both eyes, floaters and black spots from both eyes right greater than left as well as a yellow tinge of color in her visual field.  She also reported feeling very shaky.  Cardiologist did order IV fluid and 25 mg of Benadryl ultimately referred to the ER Neuro recommended MRI which  was negative for stroke, labs felt reassuring, monitored for several hours, feeing improved, discharged from the ER  Monitor was unrevealing Echo noted *LVEF 55-60%, no WMA, no significant VHD.  Was observed towards the end of the study with reports of possible allergic reaction her HR > 150 with associated rise in EF to 70-75% and then returned to baseline  Recommended to f/u with her PMD to discuss perhaps long haul covid as a possible etiology of her symptoms  ER 09/26/21 with concerns of allergic reaction after a wasp sting, developed localized swelling, itchy eyes, mouth, and rash on her belly, as well as palpitations, she was given decadron, pepcid, monitored, remained stable and discharged from the ER  I saw her 11/04/21 She is recently back from a 3 week vacation, involved a few long road trips to [facility] and other places, did a lot of walking, activities and felt well. Over the weekend last week though once back 2x she thought she was dying. Both events occurred seated, got a sudden weakness that overcame her, followed by feeling ice cold, heart then felt like it was irregular and pounding, felt shaky, laid done and slowly feeling better but took several hours to feel recovered. She did not happen to check her BP or HR. Recalls telling her husband that it felt like she was dying. No CP but a sense perhaps of heaviness with the events None otherwise  No SOB She feels like she generally keeps well  hydrated. She recalled similar episodes perhaps last year or so, again with travel Orthostatics unrevealing Given her allergies planned to try and avoid scans Will get Ddimer, LE venous US though symptoms did not sound of PE, exam does not suggest DVT Will go ahead and have her again wear a monitor with new symptoms  Dimer was normal LE venous study negative Monitor unrevealing with short SVTs, longest 45seconds, none with pt triggered events Slowest rate 50 early AM  *** ??? Treat  SVTs   Past Medical History:  Diagnosis Date   Diverticulitis    Eosinophilic esophagitis    Fibromyalgia    GERD (gastroesophageal reflux disease)    Hyperlipidemia    Hypothyroidism    IBS (irritable bowel syndrome)    Migraines    Mitral regurgitation 02/28/2014   Mild to moderate by echo 02/2014   Obesity    Osteoarthritis    Otitis externa    Palpitations    TIA (transient ischemic attack)     Past Surgical History:  Procedure Laterality Date   ABDOMINAL HYSTERECTOMY     carpel tunnel     lbp gso     LOOP RECORDER INSERTION N/A 05/08/2017   Procedure: LOOP RECORDER INSERTION;  Surgeon: Marinus Maw, MD;  Location: [facility];  Service: Cardiovascular;  Laterality: N/A;   TONSILLECTOMY AND ADENOIDECTOMY      Current Outpatient Medications  Medication Sig Dispense Refill   acetaminophen (TYLENOL) 500 MG tablet Take 1,000 mg by mouth 2 (two) times daily as needed (migraine headaches).     diphenhydrAMINE (BENADRYL) 12.5 MG chewable tablet Chew 25 mg by mouth 2 (two) times daily as needed (allergic reaction).     diphenhydrAMINE (BENADRYL) 2 % cream Apply 1 application topically daily as needed for itching (allergic reactions).     EPINEPHrine 0.3 mg/0.3 mL IJ SOAJ injection Inject 0.3 mg into the muscle as needed for anaphylaxis. 2 each 0   famotidine (PEPCID) 20 MG tablet Take 1 tablet (20 mg total) by mouth 2 (two) times daily for 5 days. 10 tablet 0   hydrocortisone cream 0.5 % Apply 1 application. topically 2 (two) times daily as needed for itching (bug bites).     levothyroxine (SYNTHROID) 88 MCG tablet Take 88 mcg by mouth daily before breakfast.     metoprolol succinate (TOPROL-XL) 25 MG 24 hr tablet TAKE 1/2 TABLET (12.5 MG TOTAL) BY MOUTH 2 (TWO) TIMES DAILY. 90 tablet 3   No current facility-administered medications for this visit.    Allergies:   Azithromycin, Codeine, Ivp dye [iodinated contrast media], Natamycin, Other, Rose, Shellfish allergy,  Thimerosol [thimerosal], Adhesive [tape], Avelox [moxifloxacin hcl in nacl], Bacitracin, Bacitracin-polymyxin b, Definity [perflutren lipid microsphere], Doxycycline, Ginger, Neomycin, Onion, Raspberry, Sunscreens, Sunscreens [aminobenzoate], Watermelon [citrullus vulgaris], Benzalkonium chloride, Cephalosporins, and Penicillins   Social History:  The patient  reports that she has never smoked. She has never used smokeless tobacco. She reports that she does not currently use alcohol after a past usage of about 1.0 standard drink of alcohol per week. She reports that she does not use drugs.   Family History:  The patient's family history includes Allergic rhinitis in her brother, father, mother, and sister; Angioedema in her sister; Asthma in her brother and father; Urticaria in her mother and sister.  ROS:  Please see the history of present illness.    All other systems are reviewed and otherwise negative.   PHYSICAL EXAM:  VS:  There were no vitals  taken for this visit. BMI: There is no height or weight on file to calculate BMI. Well nourished, well developed, in no acute distress HEENT: normocephalic, atraumatic Neck: no JVD, carotid bruits or masses Cardiac:  *** RRR; no significant murmurs, no rubs, or gallops Lungs:  *** CTA b/l, no wheezing, rhonchi or rales Abd: soft, nontender MS: no deformity or atrophy Ext: *** no edema Skin: warm and dry, no rash Neuro:  No gross deficits appreciated Psych: euthymic mood, full affect    EKG:  not done today   Aug 2023: Monitor NSR with sinus brady and sinus tachycardia Occaisional runs of SVT up to 188/min and for up to 45 seconds. Rare PAC's and PVC's No VT, atrial fib or prolonged pauses  11/09/21: LE venous US Summary:  RIGHT:  - No evidence of deep vein thrombosis in the lower extremity. No indirect  evidence of obstruction proximal to the inguinal ligament.  - No cystic structure found in the popliteal fossa.     LEFT:  - No  evidence of deep vein thrombosis in the lower extremity. No indirect  evidence of obstruction proximal to the inguinal ligament.  - No cystic structure found in the popliteal fossa.   April 2023 1. NSR with sinus brady and sinus tachycardia 2. Brief episodes of SVT, longest 33 seconds at 150/min 3. Rare PVC's and PAC's 4. No prolonged pauses  07/30/21; TTE 1. Left ventricular ejection fraction, by estimation, is 55 to 60%. Left  ventricular ejection fraction by 3D volume is 56 %. The left ventricle has  normal function. The left ventricle has no regional wall motion  abnormalities. Left ventricular diastolic   parameters were normal. The average left ventricular global longitudinal  strain is -24.6 %. The global longitudinal strain is normal.   2. Normal RV free wall strain. Right ventricular systolic function is  normal. The right ventricular size is normal. There is normal pulmonary  artery systolic pressure.   3. The mitral valve is abnormal. Mild to moderate mitral valve  regurgitation.   4. The aortic valve is tricuspid. Aortic valve regurgitation is not  visualized.   5. The inferior vena cava is normal in size with greater than 50%  respiratory variability, suggesting right atrial pressure of 3 mmHg.   Comparison(s): 07/24/20 EF 60-65%.   Conclusion(s)/Recommendation(s): At the end of the study, the sonographer  noted that the patient was thought to have had a reaction to Definity -  further imaging was obtained- it appears the patient an SVT during the  study with a HR of around 157 that  subsequently broke - LVEF increased to 70-75% during this time and  returned to normal.   07/24/2020: TTE  1. Left ventricular ejection fraction, by estimation, is 60 to 65%. The  left ventricle has normal function. The left ventricle has no regional  wall motion abnormalities. Left ventricular diastolic parameters were  normal.   2. Right ventricular systolic function is normal. The  right ventricular  size is normal.   3. The mitral valve is normal in structure. Mild mitral valve  regurgitation.   4. The aortic valve is tricuspid. Aortic valve regurgitation is not  visualized. Mild aortic valve sclerosis is present, with no evidence of  aortic valve stenosis.   5. The inferior vena cava is normal in size with greater than 50%  respiratory variability, suggesting right atrial pressure of 3 mmHg.   Comparison(s): The left ventricular function is unchanged.    06/24/2019: stress  myoview Nuclear stress EF: 64%. There was no ST segment deviation noted during stress. The study is normal. This is a low risk study. The left ventricular ejection fraction is normal (55-65%).   Normal pharmacologic nuclear study with no evidence for prior infarct or ischemia. Normal LVEF.     Recent Labs: 03/04/2021: ALT 20 07/13/2021: TSH 2.130 07/30/2021: BUN 14; Creatinine, Ser 0.91; Hemoglobin 11.8; Platelets 186; Potassium 3.9; Sodium 141  No results found for requested labs within last 365 days.   CrCl cannot be calculated (Patient's most recent lab result is older than the maximum 21 days allowed.).   Wt Readings from Last 3 Encounters:  11/04/21 195 lb 3.2 oz (88.5 kg)  09/26/21 190 lb (86.2 kg)  07/30/21 194 lb 0.1 oz (88 kg)     Other studies reviewed: Additional studies/records reviewed today include: summarized above  ASSESSMENT AND PLAN:  SVT *** No symptoms of her SVT  HTN ***    3.   Palpitations, SOB, post COVID a couple months ago ***These are improved  4. New recurrent near syncope ***  Disposition: ***   Current medicines are reviewed at length with the patient today.  The patient did not have any concerns regarding medicines.  Norma Fredrickson, PA-C 12/12/2021 9:52 AM     [facility] [address] [place] [address] [phone] (office)  [phone] (fax)

## 2021-12-14 ENCOUNTER — Encounter: Payer: Self-pay | Admitting: Physician Assistant

## 2021-12-14 ENCOUNTER — Ambulatory Visit: Payer: Medicare Other | Attending: Physician Assistant | Admitting: Physician Assistant

## 2021-12-14 ENCOUNTER — Encounter: Payer: Self-pay | Admitting: *Deleted

## 2021-12-14 VITALS — BP 112/74 | HR 82 | Ht 65.0 in | Wt 191.4 lb

## 2021-12-14 DIAGNOSIS — I10 Essential (primary) hypertension: Secondary | ICD-10-CM | POA: Diagnosis not present

## 2021-12-14 DIAGNOSIS — I471 Supraventricular tachycardia: Secondary | ICD-10-CM | POA: Diagnosis not present

## 2021-12-14 DIAGNOSIS — R079 Chest pain, unspecified: Secondary | ICD-10-CM

## 2021-12-14 MED ORDER — FAMOTIDINE 20 MG PO TABS: 20.0000 mg | ORAL_TABLET | Freq: Every day | ORAL | 1 refills | Status: DC

## 2021-12-14 MED ORDER — NITROGLYCERIN 0.4 MG SL SUBL: 0.4000 mg | SUBLINGUAL_TABLET | SUBLINGUAL | 3 refills | Status: AC | PRN

## 2021-12-14 NOTE — Patient Instructions (Addendum)
Medication Instructions:   START TAKING NITROGLYCERIN 0.4 AS NEEDED FOR CHEST PAIN   START TAKING FAMOTIDINE 20 MG ONCE A DAY    *If you need a refill on your cardiac medications before your next appointment, please call your pharmacy*   Lab Work: NONE ORDERED  TODAY   If you have labs (blood work) drawn today and your tests are completely normal, you will receive your results only by: MyChart Message (if you have MyChart) OR A paper copy in the mail If you have any lab test that is abnormal or we need to change your treatment, we will call you to review the results.   Testing/Procedures: Your physician has requested that you have a lexiscan myoview. For further information please visit [email or web address] Please follow instruction sheet, as given.    Follow-Up: At [facility], you and your health needs are our priority.  As part of our continuing mission to provide you with exceptional heart care, we have created designated Provider Care Teams.  These Care Teams include your primary Cardiologist (physician) and Advanced Practice Providers (APPs -  Physician Assistants and Nurse Practitioners) who all work together to provide you with the care you need, when you need it.  We recommend signing up for the patient portal called "MyChart".  Sign up information is provided on this After Visit Summary.  MyChart is used to connect with patients for Virtual Visits (Telemedicine).  Patients are able to view lab/test results, encounter notes, upcoming appointments, etc.  Non-urgent messages can be sent to your provider as well.   To learn more about what you can do with MyChart, go to [email or web address].    Your next appointment:   1 -2 month(s)  The format for your next appointment:   In Person  Provider:   You may see Dr. Ladona Ridgel  or one of the following Advanced Practice Providers on your designated Care Team:   Francis Dowse, PA-C   Other Instructions   Important  Information About Sugar

## 2021-12-15 DIAGNOSIS — G4733 Obstructive sleep apnea (adult) (pediatric): Secondary | ICD-10-CM | POA: Diagnosis not present

## 2021-12-17 ENCOUNTER — Telehealth (HOSPITAL_COMMUNITY): Payer: Self-pay | Admitting: *Deleted

## 2021-12-17 NOTE — Telephone Encounter (Signed)
Left message on voicemail per DPR in reference to upcoming appointment scheduled on 12/24/2021 at 7:45 with detailed instructions given per Myocardial Perfusion Study Information Sheet for the test. LM to arrive 15 minutes early, and that it is imperative to arrive on time for appointment to keep from having the test rescheduled. If you need to cancel or reschedule your appointment, please call the office within 24 hours of your appointment. Failure to do so may result in a cancellation of your appointment, and a $50 no show fee. Phone number given for call back for any questions.

## 2021-12-20 DIAGNOSIS — M25662 Stiffness of left knee, not elsewhere classified: Secondary | ICD-10-CM | POA: Diagnosis not present

## 2021-12-20 DIAGNOSIS — M25562 Pain in left knee: Secondary | ICD-10-CM | POA: Diagnosis not present

## 2021-12-24 ENCOUNTER — Ambulatory Visit (HOSPITAL_COMMUNITY): Payer: Medicare Other

## 2021-12-27 DIAGNOSIS — M25562 Pain in left knee: Secondary | ICD-10-CM | POA: Diagnosis not present

## 2021-12-27 DIAGNOSIS — M25662 Stiffness of left knee, not elsewhere classified: Secondary | ICD-10-CM | POA: Diagnosis not present

## 2021-12-31 DIAGNOSIS — M25662 Stiffness of left knee, not elsewhere classified: Secondary | ICD-10-CM | POA: Diagnosis not present

## 2021-12-31 DIAGNOSIS — M25562 Pain in left knee: Secondary | ICD-10-CM | POA: Diagnosis not present

## 2022-01-06 DIAGNOSIS — M25662 Stiffness of left knee, not elsewhere classified: Secondary | ICD-10-CM | POA: Diagnosis not present

## 2022-01-06 DIAGNOSIS — M25562 Pain in left knee: Secondary | ICD-10-CM | POA: Diagnosis not present

## 2022-01-10 DIAGNOSIS — H43813 Vitreous degeneration, bilateral: Secondary | ICD-10-CM | POA: Diagnosis not present

## 2022-01-13 DIAGNOSIS — G4733 Obstructive sleep apnea (adult) (pediatric): Secondary | ICD-10-CM | POA: Diagnosis not present

## 2022-01-13 DIAGNOSIS — E039 Hypothyroidism, unspecified: Secondary | ICD-10-CM | POA: Diagnosis not present

## 2022-01-13 DIAGNOSIS — I10 Essential (primary) hypertension: Secondary | ICD-10-CM | POA: Diagnosis not present

## 2022-01-14 DIAGNOSIS — G4733 Obstructive sleep apnea (adult) (pediatric): Secondary | ICD-10-CM | POA: Diagnosis not present

## 2022-01-14 DIAGNOSIS — M25562 Pain in left knee: Secondary | ICD-10-CM | POA: Diagnosis not present

## 2022-01-14 DIAGNOSIS — M25662 Stiffness of left knee, not elsewhere classified: Secondary | ICD-10-CM | POA: Diagnosis not present

## 2022-01-21 DIAGNOSIS — M25662 Stiffness of left knee, not elsewhere classified: Secondary | ICD-10-CM | POA: Diagnosis not present

## 2022-01-21 DIAGNOSIS — M25562 Pain in left knee: Secondary | ICD-10-CM | POA: Diagnosis not present

## 2022-02-08 ENCOUNTER — Ambulatory Visit: Payer: Medicare Other | Admitting: Internal Medicine

## 2022-02-09 DIAGNOSIS — R52 Pain, unspecified: Secondary | ICD-10-CM | POA: Diagnosis not present

## 2022-02-09 DIAGNOSIS — R5383 Other fatigue: Secondary | ICD-10-CM | POA: Diagnosis not present

## 2022-02-09 DIAGNOSIS — R051 Acute cough: Secondary | ICD-10-CM | POA: Diagnosis not present

## 2022-02-09 DIAGNOSIS — Z03818 Encounter for observation for suspected exposure to other biological agents ruled out: Secondary | ICD-10-CM | POA: Diagnosis not present

## 2022-02-09 DIAGNOSIS — J029 Acute pharyngitis, unspecified: Secondary | ICD-10-CM | POA: Diagnosis not present

## 2022-02-14 DIAGNOSIS — G4733 Obstructive sleep apnea (adult) (pediatric): Secondary | ICD-10-CM | POA: Diagnosis not present

## 2022-04-06 DIAGNOSIS — M25561 Pain in right knee: Secondary | ICD-10-CM | POA: Diagnosis not present

## 2022-04-06 DIAGNOSIS — M1711 Unilateral primary osteoarthritis, right knee: Secondary | ICD-10-CM | POA: Diagnosis not present

## 2022-04-06 DIAGNOSIS — M25562 Pain in left knee: Secondary | ICD-10-CM | POA: Diagnosis not present

## 2022-04-07 ENCOUNTER — Ambulatory Visit
Admission: RE | Admit: 2022-04-07 | Discharge: 2022-04-07 | Disposition: A | Discharge status: Home / Self Care | Payer: Medicare Other | Source: Ambulatory Visit | Attending: Medical | Admitting: Medical

## 2022-04-07 ENCOUNTER — Other Ambulatory Visit: Payer: Self-pay | Admitting: Medical

## 2022-04-07 DIAGNOSIS — M25561 Pain in right knee: Secondary | ICD-10-CM

## 2022-04-07 DIAGNOSIS — M7989 Other specified soft tissue disorders: Secondary | ICD-10-CM | POA: Diagnosis not present

## 2022-04-21 ENCOUNTER — Encounter (HOSPITAL_BASED_OUTPATIENT_CLINIC_OR_DEPARTMENT_OTHER): Payer: Self-pay

## 2022-04-21 ENCOUNTER — Other Ambulatory Visit: Payer: Self-pay

## 2022-04-21 ENCOUNTER — Emergency Department (HOSPITAL_BASED_OUTPATIENT_CLINIC_OR_DEPARTMENT_OTHER)
Admission: EM | Admit: 2022-04-21 | Discharge: 2022-04-21 | Disposition: A | Discharge status: Home / Self Care | Payer: Medicare Other | Attending: Emergency Medicine | Admitting: Emergency Medicine

## 2022-04-21 ENCOUNTER — Emergency Department (HOSPITAL_BASED_OUTPATIENT_CLINIC_OR_DEPARTMENT_OTHER): Payer: Medicare Other

## 2022-04-21 DIAGNOSIS — W01198A Fall on same level from slipping, tripping and stumbling with subsequent striking against other object, initial encounter: Secondary | ICD-10-CM | POA: Insufficient documentation

## 2022-04-21 DIAGNOSIS — Z8673 Personal history of transient ischemic attack (TIA), and cerebral infarction without residual deficits: Secondary | ICD-10-CM | POA: Diagnosis not present

## 2022-04-21 DIAGNOSIS — R220 Localized swelling, mass and lump, head: Secondary | ICD-10-CM | POA: Diagnosis not present

## 2022-04-21 DIAGNOSIS — S8992XA Unspecified injury of left lower leg, initial encounter: Secondary | ICD-10-CM | POA: Diagnosis present

## 2022-04-21 DIAGNOSIS — M47812 Spondylosis without myelopathy or radiculopathy, cervical region: Secondary | ICD-10-CM | POA: Diagnosis not present

## 2022-04-21 DIAGNOSIS — E039 Hypothyroidism, unspecified: Secondary | ICD-10-CM | POA: Insufficient documentation

## 2022-04-21 DIAGNOSIS — M25562 Pain in left knee: Secondary | ICD-10-CM | POA: Diagnosis not present

## 2022-04-21 DIAGNOSIS — T148XXA Other injury of unspecified body region, initial encounter: Secondary | ICD-10-CM

## 2022-04-21 DIAGNOSIS — S0081XA Abrasion of other part of head, initial encounter: Secondary | ICD-10-CM | POA: Diagnosis not present

## 2022-04-21 DIAGNOSIS — S8002XA Contusion of left knee, initial encounter: Secondary | ICD-10-CM

## 2022-04-21 DIAGNOSIS — S80212A Abrasion, left knee, initial encounter: Secondary | ICD-10-CM | POA: Diagnosis not present

## 2022-04-21 DIAGNOSIS — R519 Headache, unspecified: Secondary | ICD-10-CM | POA: Insufficient documentation

## 2022-04-21 DIAGNOSIS — Z043 Encounter for examination and observation following other accident: Secondary | ICD-10-CM | POA: Diagnosis not present

## 2022-04-21 DIAGNOSIS — Z79899 Other long term (current) drug therapy: Secondary | ICD-10-CM | POA: Insufficient documentation

## 2022-04-21 DIAGNOSIS — M542 Cervicalgia: Secondary | ICD-10-CM | POA: Diagnosis not present

## 2022-04-21 DIAGNOSIS — W19XXXA Unspecified fall, initial encounter: Secondary | ICD-10-CM

## 2022-04-21 MED ORDER — ACETAMINOPHEN 500 MG PO TABS
1000.0000 mg | ORAL_TABLET | Freq: Once | ORAL | Status: AC
  Administered 2022-04-21: 1000 mg via ORAL
  Filled 2022-04-21: qty 2

## 2022-04-21 NOTE — Discharge Instructions (Addendum)
Your imaging studies were reassuring today.  Please take tylenol/ibuprofen, apply ice/heat packs for pain. I recommend close follow-up with PCP for reevaluation.  Please do not hesitate to return to emergency department if worrisome signs symptoms we discussed become apparent.

## 2022-04-21 NOTE — ED Notes (Signed)
Discharge instructions, follow up care, and pain management reviewed and explained. Ice pack provided. Offered pt wheelchair to vehicle, pt states she prefers to walk and ambulated independently.

## 2022-04-21 NOTE — ED Triage Notes (Signed)
Pt c/o mechanical fall approx 1300, "fell facedown on concrete patio." L leg pain associated, advises she was checked at [facility], L leg cleared & advised to come to ED for CT. Pt not on thinners, A&O, no neuro deficit

## 2022-04-21 NOTE — ED Notes (Signed)
Refused cleansing and dressing of wound at this time

## 2022-04-21 NOTE — ED Provider Notes (Signed)
[facility] EMERGENCY DEPT Provider Note   CSN: [id number] Arrival date & time: 04/21/22  8756     History  Chief Complaint  Patient presents with   Lytle Michaels    Heather Carey is a 72 y.o. female with a past medical history of diverticulitis, fibromyalgia, GERD, hypothyroidism, migraine, TIA presenting today for evaluation after fall.  Patient reports she was stepping up on a stair when her left foot got caught at the edge of the stepped.  She fell forward hitting her face on concrete.  She has skin abrasion and bruises on the forehead, nose and left knee.  She was evaluated at [facility] urgent care where x-ray of the left knee was negative however she was recommended to be evaluated in the emergency department for head trauma.  She denies LOC, nausea or vomiting.  She was able to ambulate after the fall.  She is not on any blood thinners.  She endorses headache, neck pain, left knee pain.   Fall    Past Medical History:  Diagnosis Date   Diverticulitis    Eosinophilic esophagitis    Fibromyalgia    GERD (gastroesophageal reflux disease)    Hyperlipidemia    Hypothyroidism    IBS (irritable bowel syndrome)    Migraines    Mitral regurgitation 02/28/2014   Mild to moderate by echo 02/2014   Obesity    Osteoarthritis    Otitis externa    Palpitations    TIA (transient ischemic attack)    Past Surgical History:  Procedure Laterality Date   ABDOMINAL HYSTERECTOMY     carpel tunnel     lbp gso     LOOP RECORDER INSERTION N/A 05/08/2017   Procedure: LOOP RECORDER INSERTION;  Surgeon: Evans Lance, MD;  Location: [facility];  Service: Cardiovascular;  Laterality: N/A;   TONSILLECTOMY AND ADENOIDECTOMY       Home Medications Prior to Admission medications   Medication Sig Start Date End Date Taking? Authorizing Provider  acetaminophen (TYLENOL) 500 MG tablet Take 1,000 mg by mouth 2 (two) times daily as needed (migraine headaches).    [provider]  diphenhydrAMINE (BENADRYL) 12.5 MG chewable tablet Chew 25 mg by mouth 2 (two) times daily as needed (allergic reaction).    [provider]  diphenhydrAMINE (BENADRYL) 2 % cream Apply 1 application topically daily as needed for itching (allergic reactions).    [provider]  EPINEPHrine 0.3 mg/0.3 mL IJ SOAJ injection Inject 0.3 mg into the muscle as needed for anaphylaxis. 03/04/21   Varney Biles, MD  famotidine (PEPCID) 20 MG tablet Take 1 tablet (20 mg total) by mouth daily. 12/14/21   Baldwin Jamaica, PA-C  hydrocortisone cream 0.5 % Apply 1 application. topically 2 (two) times daily as needed for itching (bug bites).    [provider]  levothyroxine (SYNTHROID) 88 MCG tablet Take 88 mcg by mouth daily before breakfast.    [provider]  meloxicam (MOBIC) 7.5 MG tablet Take 7.5 mg by mouth daily. 12/07/21   [provider]  metoprolol succinate (TOPROL-XL) 25 MG 24 hr tablet TAKE 1/2 TABLET (12.5 MG TOTAL) BY MOUTH 2 (TWO) TIMES DAILY. 01/19/21   Evans Lance, MD  nitroGLYCERIN (NITROSTAT) 0.4 MG SL tablet Place 1 tablet (0.4 mg total) under the tongue every 5 (five) minutes as needed for chest pain. 12/14/21 03/14/22  Baldwin Jamaica, PA-C      Allergies    Azithromycin, Codeine, Ivp dye [iodinated  contrast media], Natamycin, Other, Rose, Shellfish allergy, Thimerosol [thimerosal (thiomersal)], Adhesive [tape], Avelox [moxifloxacin hcl in nacl], Bacitracin, Bacitracin-polymyxin b, Definity [perflutren lipid microsphere], Doxycycline, Ginger, Neomycin, Onion, Raspberry, Sunscreens, Sunscreens [aminobenzoate], Watermelon [citrullus vulgaris], Benzalkonium chloride, Cephalosporins, and Penicillins    Review of Systems   Review of Systems Negative except as per HPI.  Physical Exam Updated Vital Signs BP 115/76   Pulse 81   Temp 97.9 F (36.6 C) (Oral)   Resp 16   SpO2 100%  Physical Exam Vitals and nursing note  reviewed.  Constitutional:      Appearance: Normal appearance.  HENT:     Head: Normocephalic and atraumatic.     Mouth/Throat:     Mouth: Mucous membranes are moist.  Eyes:     General: No scleral icterus. Cardiovascular:     Rate and Rhythm: Normal rate and regular rhythm.     Pulses: Normal pulses.     Heart sounds: Normal heart sounds.  Pulmonary:     Effort: Pulmonary effort is normal.     Breath sounds: Normal breath sounds.  Abdominal:     General: Abdomen is flat.     Palpations: Abdomen is soft.     Tenderness: There is no abdominal tenderness.  Musculoskeletal:        General: No deformity.  Skin:    General: Skin is warm.     Findings: No rash.     Comments: Skin abrasion on forehead. Bruise on nose and L knee.  Neurological:     General: No focal deficit present.     Mental Status: She is alert.  Psychiatric:        Mood and Affect: Mood normal.     ED Results / Procedures / Treatments   Labs (all labs ordered are listed, but only abnormal results are displayed) Labs Reviewed - No data to display  EKG None  Radiology CT Maxillofacial Wo Contrast  Result Date: 04/21/2022 CLINICAL DATA:  Status post fall. EXAM: CT MAXILLOFACIAL WITHOUT CONTRAST TECHNIQUE: Multidetector CT imaging of the maxillofacial structures was performed. Multiplanar CT image reconstructions were also generated. RADIATION DOSE REDUCTION: This exam was performed according to the departmental dose-optimization program which includes automated exposure control, adjustment of the mA and/or kV according to patient size and/or use of iterative reconstruction technique. COMPARISON:  None Available. FINDINGS: Osseous: No fracture or mandibular dislocation. No destructive process. Orbits: Negative. No traumatic or inflammatory finding. Sinuses: Clear. Soft tissues: There is mild left frontal scalp soft tissue swelling. Limited intracranial: No significant or unexpected finding. IMPRESSION: Mild left  frontal scalp soft tissue swelling without evidence of acute facial bone fracture. Electronically Signed   By: Virgina Norfolk M.D.   On: 04/21/2022 17:52   CT Cervical Spine Wo Contrast  Result Date: 04/21/2022 CLINICAL DATA:  Status post fall. EXAM: CT CERVICAL SPINE WITHOUT CONTRAST TECHNIQUE: Multidetector CT imaging of the cervical spine was performed without intravenous contrast. Multiplanar CT image reconstructions were also generated. RADIATION DOSE REDUCTION: This exam was performed according to the departmental dose-optimization program which includes automated exposure control, adjustment of the mA and/or kV according to patient size and/or use of iterative reconstruction technique. COMPARISON:  Aug 13, 2020 FINDINGS: Alignment: There is straightening of the normal cervical spine lordosis. Skull base and vertebrae: No acute fracture. No primary bone lesion or focal pathologic process. Soft tissues and spinal canal: No prevertebral fluid or swelling. No visible canal hematoma. Disc levels: Mild endplate sclerosis and mild anterior osteophyte formation  are seen at the levels of C4-C5, C5-C6 and C6-C7. Moderate severity anterior osteophyte formation is seen at the level of C3-C4. There is marked severity narrowing of the anterior atlantoaxial articulation. Mild to moderate severity multilevel intervertebral disc space narrowing is seen, most prominent at the level of C6-C7. Bilateral mild to moderate severity multilevel facet joint hypertrophy is noted. Upper chest: Negative. Other: None. IMPRESSION: 1. No acute fracture or subluxation in the cervical spine. 2. Mild to moderate severity multilevel degenerative changes, most prominent at the level of C6-C7. Electronically Signed   By: Aram Candela M.D.   On: 04/21/2022 17:51   CT Head Wo Contrast  Result Date: 04/21/2022 CLINICAL DATA:  Status post fall. EXAM: CT HEAD WITHOUT CONTRAST TECHNIQUE: Contiguous axial images were obtained from the  base of the skull through the vertex without intravenous contrast. RADIATION DOSE REDUCTION: This exam was performed according to the departmental dose-optimization program which includes automated exposure control, adjustment of the mA and/or kV according to patient size and/or use of iterative reconstruction technique. COMPARISON:  None Available. FINDINGS: Brain: There is mild cerebral atrophy with widening of the extra-axial spaces and ventricular dilatation. There are areas of decreased attenuation within the white matter tracts of the supratentorial brain, consistent with microvascular disease changes. Vascular: No hyperdense vessel or unexpected calcification. Skull: Normal. Negative for fracture or focal lesion. Sinuses/Orbits: No acute finding. Other: There is mild left frontal scalp soft tissue swelling. IMPRESSION: 1. Mild left frontal scalp soft tissue swelling without evidence for an acute fracture or acute intracranial abnormality. 2. Generalized cerebral atrophy and microvascular disease changes of the supratentorial brain. Electronically Signed   By: Aram Candela M.D.   On: 04/21/2022 17:41    Procedures Procedures    Medications Ordered in ED Medications  acetaminophen (TYLENOL) tablet 1,000 mg (has no administration in time range)    ED Course/ Medical Decision Making/ A&P                           Medical Decision Making Amount and/or Complexity of Data Reviewed Radiology: ordered.  Risk OTC drugs.   This patient presents to the ED for fall, this involves an extensive number of treatment options, and is a complaint that carries with a high risk of complications and morbidity.  The differential diagnosis includes hematoma, laceration, bone fracture, dislocation, intracranial hemorrhage, soft tissue injury.  This is not an exhaustive list.  Comorbidities that complicate the patient evaluation See HPI  Social determinants of health NA  Additional history  obtained: External records from outside source obtained and reviewed including: Chart review including previous notes, labs, imaging.  Cardiac monitoring/EKG: The patient was maintained on a cardiac monitor.  I personally reviewed and interpreted the cardiac monitor which showed an underlying rhythm of: Sinus rhythm.  Lab tests:   Imaging studies: I ordered imaging studies. I personally reviewed, interpreted imaging and agree with the radiologist's interpretations. Findings include: CT head showed mild left frontal scalp soft tissue swelling without evidence for an acute fracture or acute intracranial abnormalities.  CT cervical spine show no acute fracture or subluxation in the cervical spine.  Problem list/ ED course/ Critical interventions/ Medical management: HPI: See above Vital signs within normal range and stable throughout visit. Laboratory/imaging studies significant for: See above. On physical examination, patient is afebrile and appears in no acute distress. This patient presents after fall with headache, neck pain and left knee pain. Normal appearing without  any signs or symptoms of serious injury on secondary trauma survey. Low suspicion for ICH or other intracranial traumatic injury based on CT scan. Explained to patient that they will likely be sore for the coming days and can use tylenol/ibuprofen to control the pain, patient given return precautions.  I have reviewed the patient home medicines and have made adjustments as needed.  Consultations obtained: I requested consultation with Dr. Jearld Fenton, and discussed lab and imaging findings as well as pertinent plan.  She agrees with the plan.  Disposition Continued outpatient therapy. Follow-up with PCP recommended for reevaluation of symptoms. Treatment plan discussed with patient.  Pt acknowledged understanding was agreeable to the plan. Worrisome signs and symptoms were discussed with patient, and patient acknowledged  understanding to return to the ED if they noticed these signs and symptoms. Patient was stable upon discharge.   This chart was dictated using voice recognition software.  Despite best efforts to proofread,  errors can occur which can change the documentation meaning.          Final Clinical Impression(s) / ED Diagnoses Final diagnoses:  Fall, initial encounter  Skin abrasion  Hematoma of left knee region    Rx / DC Orders ED Discharge Orders     None         Jeanelle Malling, [place] 04/22/22 1119    Loetta Rough, MD 04/22/22 [phone]

## 2022-04-25 DIAGNOSIS — M7052 Other bursitis of knee, left knee: Secondary | ICD-10-CM | POA: Diagnosis not present

## 2022-04-25 DIAGNOSIS — M79621 Pain in right upper arm: Secondary | ICD-10-CM | POA: Diagnosis not present

## 2022-04-25 DIAGNOSIS — M170 Bilateral primary osteoarthritis of knee: Secondary | ICD-10-CM | POA: Diagnosis not present

## 2022-04-29 DIAGNOSIS — Z03818 Encounter for observation for suspected exposure to other biological agents ruled out: Secondary | ICD-10-CM | POA: Diagnosis not present

## 2022-04-29 DIAGNOSIS — R051 Acute cough: Secondary | ICD-10-CM | POA: Diagnosis not present

## 2022-04-29 DIAGNOSIS — J069 Acute upper respiratory infection, unspecified: Secondary | ICD-10-CM | POA: Diagnosis not present

## 2022-05-24 ENCOUNTER — Encounter (HOSPITAL_BASED_OUTPATIENT_CLINIC_OR_DEPARTMENT_OTHER): Payer: Self-pay | Admitting: Family Medicine

## 2022-05-24 ENCOUNTER — Other Ambulatory Visit (HOSPITAL_BASED_OUTPATIENT_CLINIC_OR_DEPARTMENT_OTHER): Payer: Self-pay | Admitting: Family Medicine

## 2022-05-24 ENCOUNTER — Other Ambulatory Visit (HOSPITAL_COMMUNITY): Payer: Self-pay | Admitting: Family Medicine

## 2022-05-24 DIAGNOSIS — R1032 Left lower quadrant pain: Secondary | ICD-10-CM

## 2022-05-24 DIAGNOSIS — E039 Hypothyroidism, unspecified: Secondary | ICD-10-CM | POA: Diagnosis not present

## 2022-05-26 ENCOUNTER — Ambulatory Visit (HOSPITAL_COMMUNITY)
Admission: RE | Admit: 2022-05-26 | Discharge: 2022-05-26 | Disposition: A | Discharge status: Home / Self Care | Payer: Medicare Other | Source: Ambulatory Visit | Attending: Family Medicine | Admitting: Family Medicine

## 2022-05-26 DIAGNOSIS — I700 Atherosclerosis of aorta: Secondary | ICD-10-CM | POA: Diagnosis not present

## 2022-05-26 DIAGNOSIS — R1032 Left lower quadrant pain: Secondary | ICD-10-CM | POA: Insufficient documentation

## 2022-05-26 DIAGNOSIS — R109 Unspecified abdominal pain: Secondary | ICD-10-CM | POA: Diagnosis not present

## 2022-07-18 DIAGNOSIS — M5451 Vertebrogenic low back pain: Secondary | ICD-10-CM | POA: Diagnosis not present

## 2022-08-15 ENCOUNTER — Other Ambulatory Visit: Payer: Self-pay | Admitting: Internal Medicine

## 2022-08-22 ENCOUNTER — Ambulatory Visit: Admit: 2022-08-22 | Payer: Medicare Other

## 2022-08-22 DIAGNOSIS — J00 Acute nasopharyngitis [common cold]: Secondary | ICD-10-CM | POA: Diagnosis not present

## 2022-08-22 DIAGNOSIS — I959 Hypotension, unspecified: Secondary | ICD-10-CM | POA: Diagnosis not present

## 2022-08-24 DIAGNOSIS — J029 Acute pharyngitis, unspecified: Secondary | ICD-10-CM | POA: Diagnosis not present

## 2022-10-11 DIAGNOSIS — R197 Diarrhea, unspecified: Secondary | ICD-10-CM | POA: Diagnosis not present

## 2022-10-11 DIAGNOSIS — K219 Gastro-esophageal reflux disease without esophagitis: Secondary | ICD-10-CM | POA: Diagnosis not present

## 2022-10-12 ENCOUNTER — Other Ambulatory Visit: Payer: Self-pay | Admitting: Pain Medicine

## 2022-10-12 DIAGNOSIS — Z1231 Encounter for screening mammogram for malignant neoplasm of breast: Secondary | ICD-10-CM

## 2022-10-17 ENCOUNTER — Ambulatory Visit
Admission: RE | Admit: 2022-10-17 | Discharge: 2022-10-17 | Disposition: A | Discharge status: Home / Self Care | Payer: Medicare Other | Source: Ambulatory Visit | Attending: Pain Medicine | Admitting: Pain Medicine

## 2022-10-17 DIAGNOSIS — Z1231 Encounter for screening mammogram for malignant neoplasm of breast: Secondary | ICD-10-CM

## 2022-10-18 ENCOUNTER — Other Ambulatory Visit: Payer: Self-pay | Admitting: Pain Medicine

## 2022-10-18 DIAGNOSIS — R928 Other abnormal and inconclusive findings on diagnostic imaging of breast: Secondary | ICD-10-CM

## 2022-10-19 DIAGNOSIS — L820 Inflamed seborrheic keratosis: Secondary | ICD-10-CM | POA: Diagnosis not present

## 2022-10-19 DIAGNOSIS — B070 Plantar wart: Secondary | ICD-10-CM | POA: Diagnosis not present

## 2022-10-31 ENCOUNTER — Ambulatory Visit
Admission: RE | Admit: 2022-10-31 | Discharge: 2022-10-31 | Disposition: A | Discharge status: Home / Self Care | Payer: Medicare Other | Source: Ambulatory Visit | Attending: Pain Medicine | Admitting: Pain Medicine

## 2022-10-31 ENCOUNTER — Other Ambulatory Visit: Payer: Self-pay | Admitting: Pain Medicine

## 2022-10-31 DIAGNOSIS — R928 Other abnormal and inconclusive findings on diagnostic imaging of breast: Secondary | ICD-10-CM

## 2022-11-02 ENCOUNTER — Ambulatory Visit
Admission: RE | Admit: 2022-11-02 | Discharge: 2022-11-02 | Disposition: A | Discharge status: Home / Self Care | Payer: Medicare Other | Source: Ambulatory Visit | Attending: Pain Medicine | Admitting: Pain Medicine

## 2022-11-02 DIAGNOSIS — R928 Other abnormal and inconclusive findings on diagnostic imaging of breast: Secondary | ICD-10-CM

## 2022-11-02 DIAGNOSIS — N6002 Solitary cyst of left breast: Secondary | ICD-10-CM | POA: Diagnosis not present

## 2022-11-02 HISTORY — PX: BREAST BIOPSY: SHX20

## 2022-11-29 ENCOUNTER — Telehealth: Payer: Self-pay | Admitting: Internal Medicine

## 2022-11-29 NOTE — Telephone Encounter (Signed)
Diagnosed w covid 3 weeks ago.  Previously she had episodes where she "just collapsed" felt like passing out, needing to lie down, bad tachycardia.    Its has never really stopped happening after Covid clears up, but worse since having recent covid infection again.  Collapsing in the floor, legs turn to jelly lies down and elevates legs. Feet and hands get ice cold.   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 Feels different than SVT.  Always happens without warning after sitting for various lengths of time.  She was seen for this previously by EP APP.  Requesting to see them again.  I have scheduled her w Mardelle Matte on 12/30/22.  Encouraged increasing fluids. She only drinks about 3 bottles of water daily.  Eats salty snacks.

## 2022-11-29 NOTE — Telephone Encounter (Signed)
Left message for patient to call back  

## 2022-11-29 NOTE — Telephone Encounter (Signed)
Pt is requesting a callback regarding her starting to have episodes again after this round of Covid she had 3 weeks ago. She'd like to discuss further with the nurse. Please advise

## 2022-11-29 NOTE — Telephone Encounter (Signed)
Pt returning nurses phone call. Please advise ?

## 2022-12-30 ENCOUNTER — Ambulatory Visit: Payer: Medicare Other | Attending: Student | Admitting: Student

## 2022-12-30 ENCOUNTER — Encounter: Payer: Self-pay | Admitting: Student

## 2022-12-30 VITALS — BP 114/78 | HR 72 | Ht 65.0 in | Wt 196.2 lb

## 2022-12-30 DIAGNOSIS — R55 Syncope and collapse: Secondary | ICD-10-CM | POA: Diagnosis not present

## 2022-12-30 DIAGNOSIS — R079 Chest pain, unspecified: Secondary | ICD-10-CM

## 2022-12-30 DIAGNOSIS — I471 Supraventricular tachycardia, unspecified: Secondary | ICD-10-CM | POA: Diagnosis not present

## 2022-12-30 DIAGNOSIS — I10 Essential (primary) hypertension: Secondary | ICD-10-CM

## 2022-12-30 NOTE — Progress Notes (Signed)
Electrophysiology Office Note:   Date:  12/30/2022  ID:  Heather Carey, DOB March 05, 1951, MRN [id number]  Primary Cardiologist: Lewayne Bunting, MD Electrophysiologist: None      History of Present Illness:   Heather Carey is a 72 y.o. 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    Patient reports having COVID end of July, first week of August, and since then her chronic intermittent orthostatic symptoms have been worse. Hydration is OK. She had another viral syndrome last week. Her symptoms are lightheadedness and waves of weakness, the majority of which occur with standing or rapid position changes. She is at mildly orthostatic on check today.   Review of systems complete and found to be negative unless listed in HPI.   Device History: Medtronic loop recorder (RRT 2022)   Studies Reviewed:    EKG is ordered today. Personal review as below.  Echo 07/2021 LVEF 55-60%  Physical Exam:   VS:  BP 114/78 (BP Location: Left Arm, Patient Position: Sitting, Cuff Size: Large)   Pulse 72   Ht 5\' 5"  (1.651 m)   Wt 196 lb 3.2 oz (89 kg)   SpO2 96%   BMI 32.65 kg/m     Orthostatics:  Sitting 120/78 Standing at 0 112/80  Standing at 1 minute 98/72  Wt Readings from Last 3 Encounters:  12/30/22 196 lb 3.2 oz (89 kg)  12/14/21 191 lb 6.4 oz (86.8 kg)  11/04/21 195 lb 3.2 oz (88.5 kg)     GEN: Well nourished, well developed in no acute distress NECK: No JVD; No carotid bruits CARDIAC: Regular rate and rhythm, no murmurs, rubs, gallops RESPIRATORY:  Clear to auscultation without rales, wheezing or rhonchi  ABDOMEN: Soft, non-tender, non-distended EXTREMITIES:  No edema; No deformity   ILR Interrogation- reviewed in detail today,  See PACEART report  ASSESSMENT AND PLAN:    Near syncope / Weak spell Monitor 11/2021 showed very short  episodes of SVT.  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  We discussed the role of salt and water repletion and the awareness of triggers and the role of ambient heat and dehydration.  Discussed salt substitutes with Rehydration solutions include Liquid IV, NUUN, TriOral, Normralyte pedialyte advanced care and Banana Bags.    Discussed the role of compression starting at the thigh up to the abdomen, nothing below knee with much efficacy.   SVT   Unclear if contributing Continue toprol 12.5 mg at bedtime as she is taking for now. Can increase to BID as needed.   HTN Stable on current regimen   Follow up with EP APP in 6 months, sooner with symptoms  Signed, Graciella Freer, PA-C

## 2022-12-30 NOTE — Patient Instructions (Signed)
Medication Instructions:  Your physician recommends that you continue on your current medications as directed. Please refer to the Current Medication list given to you today.  *If you need a refill on your cardiac medications before your next appointment, please call your pharmacy*  Lab Work: None ordered If you have labs (blood work) drawn today and your tests are completely normal, you will receive your results only by: MyChart Message (if you have MyChart) OR A paper copy in the mail If you have any lab test that is abnormal or we need to change your treatment, we will call you to review the results.  Follow-Up: At [facility], you and your health needs are our priority.  As part of our continuing mission to provide you with exceptional heart care, we have created designated Provider Care Teams.  These Care Teams include your primary Cardiologist (physician) and Advanced Practice Providers (APPs -  Physician Assistants and Nurse Practitioners) who all work together to provide you with the care you need, when you need it.  Your next appointment:   6 month(s)  Provider:   Lewayne Bunting, MD

## 2023-01-03 ENCOUNTER — Other Ambulatory Visit: Payer: Self-pay | Admitting: Nurse Practitioner

## 2023-01-03 DIAGNOSIS — R109 Unspecified abdominal pain: Secondary | ICD-10-CM

## 2023-01-03 DIAGNOSIS — R1310 Dysphagia, unspecified: Secondary | ICD-10-CM | POA: Diagnosis not present

## 2023-01-03 DIAGNOSIS — K200 Eosinophilic esophagitis: Secondary | ICD-10-CM | POA: Diagnosis not present

## 2023-01-03 DIAGNOSIS — Z800 Family history of malignant neoplasm of digestive organs: Secondary | ICD-10-CM | POA: Diagnosis not present

## 2023-01-03 DIAGNOSIS — Z1211 Encounter for screening for malignant neoplasm of colon: Secondary | ICD-10-CM | POA: Diagnosis not present

## 2023-01-08 DIAGNOSIS — B379 Candidiasis, unspecified: Secondary | ICD-10-CM | POA: Diagnosis not present

## 2023-01-09 ENCOUNTER — Inpatient Hospital Stay: Admission: RE | Admit: 2023-01-09 | Payer: Medicare Other | Source: Ambulatory Visit

## 2023-01-09 ENCOUNTER — Ambulatory Visit
Admission: RE | Admit: 2023-01-09 | Discharge: 2023-01-09 | Disposition: A | Discharge status: Home / Self Care | Payer: Medicare Other | Source: Ambulatory Visit | Attending: Nurse Practitioner | Admitting: Nurse Practitioner

## 2023-01-09 DIAGNOSIS — R109 Unspecified abdominal pain: Secondary | ICD-10-CM

## 2023-01-11 DIAGNOSIS — I700 Atherosclerosis of aorta: Secondary | ICD-10-CM | POA: Diagnosis not present

## 2023-01-11 DIAGNOSIS — E039 Hypothyroidism, unspecified: Secondary | ICD-10-CM | POA: Diagnosis not present

## 2023-01-11 DIAGNOSIS — Z780 Asymptomatic menopausal state: Secondary | ICD-10-CM | POA: Diagnosis not present

## 2023-01-11 DIAGNOSIS — E538 Deficiency of other specified B group vitamins: Secondary | ICD-10-CM | POA: Diagnosis not present

## 2023-01-11 DIAGNOSIS — Z0000 Encounter for general adult medical examination without abnormal findings: Secondary | ICD-10-CM | POA: Diagnosis not present

## 2023-01-11 DIAGNOSIS — D649 Anemia, unspecified: Secondary | ICD-10-CM | POA: Diagnosis not present

## 2023-01-11 DIAGNOSIS — E7800 Pure hypercholesterolemia, unspecified: Secondary | ICD-10-CM | POA: Diagnosis not present

## 2023-01-11 DIAGNOSIS — G4733 Obstructive sleep apnea (adult) (pediatric): Secondary | ICD-10-CM | POA: Diagnosis not present

## 2023-01-11 DIAGNOSIS — Z23 Encounter for immunization: Secondary | ICD-10-CM | POA: Diagnosis not present

## 2023-01-11 DIAGNOSIS — R21 Rash and other nonspecific skin eruption: Secondary | ICD-10-CM | POA: Diagnosis not present

## 2023-01-11 DIAGNOSIS — E559 Vitamin D deficiency, unspecified: Secondary | ICD-10-CM | POA: Diagnosis not present

## 2023-01-12 ENCOUNTER — Encounter: Payer: Self-pay | Admitting: Pain Medicine

## 2023-01-12 ENCOUNTER — Ambulatory Visit
Admission: RE | Admit: 2023-01-12 | Discharge: 2023-01-12 | Disposition: A | Discharge status: Home / Self Care | Payer: Medicare Other | Source: Ambulatory Visit | Attending: Nurse Practitioner | Admitting: Nurse Practitioner

## 2023-01-12 ENCOUNTER — Other Ambulatory Visit: Payer: Self-pay | Admitting: Pain Medicine

## 2023-01-12 DIAGNOSIS — K449 Diaphragmatic hernia without obstruction or gangrene: Secondary | ICD-10-CM | POA: Diagnosis not present

## 2023-01-12 DIAGNOSIS — Z780 Asymptomatic menopausal state: Secondary | ICD-10-CM

## 2023-01-12 DIAGNOSIS — K5730 Diverticulosis of large intestine without perforation or abscess without bleeding: Secondary | ICD-10-CM | POA: Diagnosis not present

## 2023-01-12 DIAGNOSIS — R1032 Left lower quadrant pain: Secondary | ICD-10-CM | POA: Diagnosis not present

## 2023-01-12 DIAGNOSIS — I700 Atherosclerosis of aorta: Secondary | ICD-10-CM | POA: Diagnosis not present

## 2023-02-07 DIAGNOSIS — H532 Diplopia: Secondary | ICD-10-CM | POA: Diagnosis not present

## 2023-02-07 DIAGNOSIS — H35341 Macular cyst, hole, or pseudohole, right eye: Secondary | ICD-10-CM | POA: Diagnosis not present

## 2023-02-07 DIAGNOSIS — H02834 Dermatochalasis of left upper eyelid: Secondary | ICD-10-CM | POA: Diagnosis not present

## 2023-02-07 DIAGNOSIS — H5310 Unspecified subjective visual disturbances: Secondary | ICD-10-CM | POA: Diagnosis not present

## 2023-02-07 DIAGNOSIS — H2513 Age-related nuclear cataract, bilateral: Secondary | ICD-10-CM | POA: Diagnosis not present

## 2023-02-07 DIAGNOSIS — H02831 Dermatochalasis of right upper eyelid: Secondary | ICD-10-CM | POA: Diagnosis not present

## 2023-02-07 NOTE — Progress Notes (Shared)
[facility] - Clinic Note  02/08/2023   CHIEF COMPLAINT Patient presents for Retina Evaluation  HISTORY OF PRESENT ILLNESS: Heather Carey is a 72 y.o. female who presents to the clinic today for:  HPI     Retina Evaluation   In both eyes.  This started 2 months ago.  Duration of 2 months.  I, the attending physician,  performed the HPI with the patient and updated documentation appropriately.        Comments   Patient here for Retina Evaluation. Referred by Janee Morn PA. Patient states vision at night in bed when looking at white blinds or light walls sees a black circle. Don't notice it in the daylight. Not sure which eye sees it. Mathis Fare saw a hole OD. Today feels like pressure in OD. Not yesterday. Last couple months notice double vision. Not using drops.       Last edited by Rennis Chris, MD on 02/08/2023 10:48 PM.    Patient states when she looks at the wall in her bedroom she sees a block circle. The black circle tracks with vision.   Referring physician: Alma Downs, PA-C  [facility] [address] [place],  [place] [address]  HISTORICAL INFORMATION:  Selected notes from the MEDICAL RECORD NUMBER Alma Downs, PA-C for concern of lamellar hole LEE:  Ocular Hx- PMH-   CURRENT MEDICATIONS: No current outpatient medications on file. (Ophthalmic Drugs)   No current facility-administered medications for this visit. (Ophthalmic Drugs)   Current Outpatient Medications (Other)  Medication Sig   acetaminophen (TYLENOL) 500 MG tablet Take 1,000 mg by mouth 2 (two) times daily as needed (migraine headaches).   diphenhydrAMINE (BENADRYL) 12.5 MG chewable tablet Chew 25 mg by mouth 2 (two) times daily as needed (allergic reaction).   diphenhydrAMINE (BENADRYL) 2 % cream Apply 1 application topically daily as needed for itching (allergic reactions).   EPINEPHrine 0.3 mg/0.3 mL IJ SOAJ injection Inject 0.3 mg into the  muscle as needed for anaphylaxis.   famotidine (PEPCID) 20 MG tablet Take 1 tablet (20 mg total) by mouth daily.   hydrocortisone cream 0.5 % Apply 1 application. topically 2 (two) times daily as needed for itching (bug bites).   levothyroxine (SYNTHROID) 88 MCG tablet Take 88 mcg by mouth daily before breakfast.   metoprolol succinate (TOPROL-XL) 25 MG 24 hr tablet TAKE 1/2 TABLET (12.5 MG TOTAL) BY MOUTH 2 (TWO) TIMES DAILY.   meloxicam (MOBIC) 7.5 MG tablet Take 7.5 mg by mouth daily. (Patient not taking: Reported on 02/08/2023)   nitroGLYCERIN (NITROSTAT) 0.4 MG SL tablet Place 1 tablet (0.4 mg total) under the tongue every 5 (five) minutes as needed for chest pain.   No current facility-administered medications for this visit. (Other)   REVIEW OF SYSTEMS: ROS   Positive for: Cardiovascular, Eyes Last edited by Laddie Aquas, COA on 02/08/2023  9:00 AM.     ALLERGIES Allergies  Allergen Reactions   Azithromycin Anaphylaxis   Codeine Shortness Of Breath   Ivp Dye [Iodinated Contrast Media] Anaphylaxis   Natamycin Other (See Comments)    Throat Swelling   Other Anaphylaxis, Hives, Swelling and Other (See Comments)    Irregular heart beat Triple Antibiotic Ointment Nuts - Anaphylaxis  Berries - itching and swelling    Rose Hives   Shellfish Allergy Anaphylaxis   Thimerosol [Thimerosal (Thiomersal)] Anaphylaxis   Adhesive [Tape] Hives    Paper tape is ok   Avelox [Moxifloxacin  Hcl In Nacl] Other (See Comments)    Irregular heart beat   Bacitracin Hives and Itching   Bacitracin-Polymyxin B     Other reaction(s): hives   Definity [Perflutren Lipid Microsphere] Other (See Comments)    Shakiness, numbness in extremities, headache, SVT, hallucinations    Doxycycline Hives and Swelling    Facial swelling   Ginger Hives and Swelling   Neomycin Hives   Onion Swelling    MOUTH SWELLING   Raspberry Itching and Swelling   Sunscreens     Other reaction(s): HIVES/THROAT  SWELLING   Sunscreens [Aminobenzoate] Hives and Swelling    Avobenzene and Oxybenzene   Watermelon [Citrullus Vulgaris]     Tongue swelling    Benzalkonium Chloride Rash   Cephalosporins Hives and Rash   Penicillins Other (See Comments)    Childhood allergy Has patient had a PCN reaction causing immediate rash, facial/tongue/throat swelling, SOB or lightheadedness with hypotension: Unknown Has patient had a PCN reaction causing severe rash involving mucus membranes or skin necrosis: Unknown Has patient had a PCN reaction that required hospitalization: No Has patient had a PCN reaction occurring within the last 10 years: No If all of the above answers are "NO", then may proceed with Cephalosporin use.    PAST MEDICAL HISTORY Past Medical History:  Diagnosis Date   Diverticulitis    Eosinophilic esophagitis    Fibromyalgia    GERD (gastroesophageal reflux disease)    Hyperlipidemia    Hypothyroidism    IBS (irritable bowel syndrome)    Migraines    Mitral regurgitation 02/28/2014   Mild to moderate by echo 02/2014   Obesity    Osteoarthritis    Otitis externa    Palpitations    TIA (transient ischemic attack)    Past Surgical History:  Procedure Laterality Date   ABDOMINAL HYSTERECTOMY     BREAST BIOPSY Left 11/02/2022   MM LT BREAST BX W LOC DEV 1ST LESION IMAGE BX SPEC STEREO GUIDE 11/02/2022 [facility]   carpel tunnel     lbp gso     LOOP RECORDER INSERTION N/A 05/08/2017   Procedure: LOOP RECORDER INSERTION;  Surgeon: Marinus Maw, MD;  Location: [facility];  Service: Cardiovascular;  Laterality: N/A;   TONSILLECTOMY AND ADENOIDECTOMY     FAMILY HISTORY Family History  Problem Relation Age of Onset   Allergic rhinitis Mother    Urticaria Mother    Allergic rhinitis Father    Asthma Father    Allergic rhinitis Sister    Angioedema Sister    Urticaria Sister    Allergic rhinitis Brother    Asthma Brother    SOCIAL HISTORY Social History    Tobacco Use   Smoking status: Never   Smokeless tobacco: Never  Vaping Use   Vaping status: Never Used  Substance Use Topics   Alcohol use: Not Currently    Alcohol/week: 1.0 standard drink of alcohol    Types: 1 Glasses of wine per week   Drug use: No       OPHTHALMIC EXAM:  Base Eye Exam     Visual Acuity (Snellen - Linear)       Right Left   Dist cc 20/20 20/20    Correction: Glasses         Tonometry (Tonopen, 8:55 AM)       Right Left   Pressure 15 14         Pupils       Dark Light Shape  React APD   Right 3 2 Round Brisk None   Left 3 2 Round Brisk None         Visual Fields (Counting fingers)       Left Right    Full Full         Extraocular Movement       Right Left    Full, Ortho Full, Ortho         Neuro/Psych     Oriented x3: Yes   Mood/Affect: Normal         Dilation     Both eyes: 1.0% Mydriacyl, 2.5% Phenylephrine @ 8:54 AM           Slit Lamp and Fundus Exam     External Exam       Right Left   External Normal Normal         Slit Lamp Exam       Right Left   Lids/Lashes Dermatochalasis - upper lid, Meibomian gland dysfunction Dermatochalasis - upper lid, Meibomian gland dysfunction   Conjunctiva/Sclera White and quiet White and quiet   Cornea trace Punctate epithelial erosions, trace Debris in tear film trace Punctate epithelial erosions, trace Debris in tear film   Anterior Chamber Deep and clear, 1+ fine cell/pigment Deep and clear, 1+ fine cell/pigment   Iris Round and dilated Round and dilated   Lens 2-3+ Nuclear sclerosis, 2+ Cortical cataract 2-3+ Nuclear sclerosis, 2+ Cortical cataract   Anterior Vitreous Vitreous syneresis, Posterior vitreous detachment Vitreous syneresis, Vitreous condensations         Fundus Exam       Right Left   Disc Pink and sharp, Compact Pink and sharp, Compact   C/D Ratio 0.1 0.1   Macula Flat, Blunted foveal reflex, RPE mottling, No heme or edema, trace  cystic changes Flat, Blunted foveal reflex, RPE mottling, No heme or edema   Vessels Vascular attenuation, Tortuous Vascular attenuation, Tortuous   Periphery Attached, No heme Attached, No heme           Refraction     Wearing Rx       Sphere Cylinder Axis Add   Right -0.50 +1.25 007 +2.00   Left -1.00 +1.25 007 +2.00           IMAGING AND PROCEDURES  Imaging and Procedures for 02/08/2023  OCT, Retina - OU - Both Eyes       Right Eye Quality was good. Central Foveal Thickness: 256. Progression has no prior data. Findings include normal foveal contour (Central foveal notch, trace cystic changes temporal fovea).   Left Eye Quality was good. Central Foveal Thickness: 254. Progression has no prior data. Findings include normal foveal contour, no IRF, no SRF, vitreomacular adhesion .   Notes *Images captured and stored on drive  Diagnosis / Impression:  OD: Central foveal notch, trace cystic changes temporal fovea OS: NFP; no IRF/SRF   Clinical management:  See below  Abbreviations: NFP - Normal foveal profile. CME - cystoid macular edema. PED - pigment epithelial detachment. IRF - intraretinal fluid. SRF - subretinal fluid. EZ - ellipsoid zone. ERM - epiretinal membrane. ORA - outer retinal atrophy. ORT - outer retinal tubulation. SRHM - subretinal hyper-reflective material. IRHM - intraretinal hyper-reflective material           ASSESSMENT/PLAN:   ICD-10-CM   1. Visual disturbance  H53.9     2. History of migraine headaches  Z86.69     3. Retinal cyst of right  eye  H33.191 OCT, Retina - OU - Both Eyes    4. PVD (posterior vitreous detachment), right  H43.811     5. Combined forms of age-related cataract of both eyes  H25.813      1,2. 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 Retinal cysts OD - OCT OD shows Central foveal notch, trace cystic changes temporal fovea  - unclear etiology - will monitor for now as vision 20/20 - f/u in 4-6 wks -- DFE/OCT, FA transit OD  4. PVD / vitreous syneresis, OD  - Discussed findings and prognosis  - No RT or RD on 360 peripheral exam  - Reviewed s/s of RT/RD  - Strict return precautions for any such RT/RD signs/symptoms  5. Mixed Cataract OU - The symptoms of cataract, surgical options, and treatments and risks were discussed with patient. - discussed diagnosis and progression - monitor   Ophthalmic Meds Ordered this visit:  No orders of the defined types were placed in this encounter.    Return in about 6 weeks (around 03/22/2023) for f/u Ocular migraine/visual disturbance , DFE, OCT, FA, Transit OD.  There are no Patient Instructions on file for this visit.  Explained the diagnoses, plan, and follow up with the patient and they expressed understanding.  Patient expressed understanding of the importance of proper follow up care.   This document serves as a record of services personally performed by Karie Chimera, MD, PhD. It was created on their behalf by Glee Arvin. Manson Passey, OA an ophthalmic technician. The creation of this record is the provider's dictation and/or activities during the visit.    Electronically signed by: Glee Arvin. Manson Passey, OA 02/08/23 11:27 PM  This document serves as a record of services personally performed by Karie Chimera, MD, PhD. It was created on their behalf by Charlette Caffey, COT an ophthalmic technician. The creation of this record is the provider's dictation and/or activities during the visit.    Electronically signed by:  Charlette Caffey, COT  02/08/23  11:27 PM  Karie Chimera, M.D., Ph.D. Diseases & Surgery of the Retina and Vitreous [facility] 02/08/2023  I have reviewed the above documentation for accuracy and completeness, and I agree with the above. Karie Chimera, M.D., Ph.D. 02/08/23 11:27 PM   Abbreviations: M myopia (nearsighted); A astigmatism; H hyperopia (farsighted); P presbyopia; Mrx spectacle prescription;  CTL contact lenses; OD right eye; OS left eye; OU both eyes  XT exotropia; ET esotropia; PEK punctate epithelial keratitis; PEE punctate epithelial erosions; DES dry eye syndrome; MGD meibomian gland dysfunction; ATs artificial tears; PFAT's preservative free artificial tears; NSC nuclear sclerotic cataract; PSC posterior subcapsular cataract; ERM epi-retinal membrane; PVD posterior vitreous detachment; RD retinal detachment; DM diabetes mellitus; DR diabetic retinopathy; NPDR non-proliferative diabetic retinopathy; PDR proliferative diabetic retinopathy; CSME clinically significant macular edema; DME diabetic macular edema; dbh dot blot hemorrhages; CWS cotton wool spot; POAG primary open angle glaucoma; C/D cup-to-disc ratio; HVF humphrey visual field; GVF goldmann visual field; OCT optical  coherence tomography; IOP intraocular pressure; BRVO Branch retinal vein occlusion; CRVO central retinal vein occlusion; CRAO central retinal artery occlusion; BRAO branch retinal artery occlusion; RT retinal tear; SB scleral buckle; PPV pars plana vitrectomy; VH Vitreous hemorrhage; PRP panretinal laser photocoagulation; IVK intravitreal kenalog; VMT vitreomacular traction; MH Macular hole;  NVD neovascularization of the disc; NVE neovascularization elsewhere; AREDS age related eye disease study; ARMD age related macular degeneration; POAG primary open angle glaucoma; EBMD epithelial/anterior basement membrane dystrophy; ACIOL anterior chamber intraocular lens; IOL intraocular lens; PCIOL posterior chamber intraocular  lens; Phaco/IOL phacoemulsification with intraocular lens placement; PRK photorefractive keratectomy; LASIK laser assisted in situ keratomileusis; HTN hypertension; DM diabetes mellitus; COPD chronic obstructive pulmonary disease

## 2023-02-08 ENCOUNTER — Encounter (INDEPENDENT_AMBULATORY_CARE_PROVIDER_SITE_OTHER): Payer: Self-pay | Admitting: Ophthalmology

## 2023-02-08 ENCOUNTER — Ambulatory Visit (INDEPENDENT_AMBULATORY_CARE_PROVIDER_SITE_OTHER): Payer: Medicare Other | Admitting: Ophthalmology

## 2023-02-08 DIAGNOSIS — H539 Unspecified visual disturbance: Secondary | ICD-10-CM | POA: Diagnosis not present

## 2023-02-08 DIAGNOSIS — Z8669 Personal history of other diseases of the nervous system and sense organs: Secondary | ICD-10-CM

## 2023-02-08 DIAGNOSIS — H25813 Combined forms of age-related cataract, bilateral: Secondary | ICD-10-CM | POA: Diagnosis not present

## 2023-02-08 DIAGNOSIS — H43811 Vitreous degeneration, right eye: Secondary | ICD-10-CM | POA: Diagnosis not present

## 2023-02-08 DIAGNOSIS — H33191 Other retinoschisis and retinal cysts, right eye: Secondary | ICD-10-CM

## 2023-02-08 DIAGNOSIS — H3581 Retinal edema: Secondary | ICD-10-CM

## 2023-02-12 DIAGNOSIS — J039 Acute tonsillitis, unspecified: Secondary | ICD-10-CM | POA: Diagnosis not present

## 2023-02-12 DIAGNOSIS — J36 Peritonsillar abscess: Secondary | ICD-10-CM | POA: Diagnosis not present

## 2023-02-12 DIAGNOSIS — J060 Acute laryngopharyngitis: Secondary | ICD-10-CM | POA: Diagnosis not present

## 2023-02-12 DIAGNOSIS — J020 Streptococcal pharyngitis: Secondary | ICD-10-CM | POA: Diagnosis not present

## 2023-02-12 DIAGNOSIS — R051 Acute cough: Secondary | ICD-10-CM | POA: Diagnosis not present

## 2023-02-12 DIAGNOSIS — U071 COVID-19: Secondary | ICD-10-CM | POA: Diagnosis not present

## 2023-02-14 DIAGNOSIS — K317 Polyp of stomach and duodenum: Secondary | ICD-10-CM | POA: Diagnosis not present

## 2023-02-14 DIAGNOSIS — K2210 Ulcer of esophagus without bleeding: Secondary | ICD-10-CM | POA: Diagnosis not present

## 2023-02-14 DIAGNOSIS — K5730 Diverticulosis of large intestine without perforation or abscess without bleeding: Secondary | ICD-10-CM | POA: Diagnosis not present

## 2023-02-14 DIAGNOSIS — K200 Eosinophilic esophagitis: Secondary | ICD-10-CM | POA: Diagnosis not present

## 2023-02-14 DIAGNOSIS — R1310 Dysphagia, unspecified: Secondary | ICD-10-CM | POA: Diagnosis not present

## 2023-02-14 DIAGNOSIS — K449 Diaphragmatic hernia without obstruction or gangrene: Secondary | ICD-10-CM | POA: Diagnosis not present

## 2023-02-14 DIAGNOSIS — K222 Esophageal obstruction: Secondary | ICD-10-CM | POA: Diagnosis not present

## 2023-02-14 DIAGNOSIS — R1032 Left lower quadrant pain: Secondary | ICD-10-CM | POA: Diagnosis not present

## 2023-02-16 DIAGNOSIS — K200 Eosinophilic esophagitis: Secondary | ICD-10-CM | POA: Diagnosis not present

## 2023-02-16 DIAGNOSIS — K317 Polyp of stomach and duodenum: Secondary | ICD-10-CM | POA: Diagnosis not present

## 2023-02-27 ENCOUNTER — Other Ambulatory Visit: Payer: Self-pay | Admitting: Internal Medicine

## 2023-03-05 ENCOUNTER — Emergency Department (HOSPITAL_BASED_OUTPATIENT_CLINIC_OR_DEPARTMENT_OTHER)
Admission: EM | Admit: 2023-03-05 | Discharge: 2023-03-05 | Disposition: A | Discharge status: Home / Self Care | Payer: Medicare Other | Attending: Emergency Medicine | Admitting: Emergency Medicine

## 2023-03-05 ENCOUNTER — Other Ambulatory Visit: Payer: Self-pay

## 2023-03-05 ENCOUNTER — Emergency Department (HOSPITAL_BASED_OUTPATIENT_CLINIC_OR_DEPARTMENT_OTHER): Payer: Medicare Other

## 2023-03-05 ENCOUNTER — Encounter (HOSPITAL_BASED_OUTPATIENT_CLINIC_OR_DEPARTMENT_OTHER): Payer: Self-pay | Admitting: Emergency Medicine

## 2023-03-05 DIAGNOSIS — K5730 Diverticulosis of large intestine without perforation or abscess without bleeding: Secondary | ICD-10-CM | POA: Diagnosis not present

## 2023-03-05 DIAGNOSIS — N281 Cyst of kidney, acquired: Secondary | ICD-10-CM | POA: Diagnosis not present

## 2023-03-05 DIAGNOSIS — K449 Diaphragmatic hernia without obstruction or gangrene: Secondary | ICD-10-CM | POA: Diagnosis not present

## 2023-03-05 DIAGNOSIS — R1032 Left lower quadrant pain: Secondary | ICD-10-CM | POA: Insufficient documentation

## 2023-03-05 DIAGNOSIS — R109 Unspecified abdominal pain: Secondary | ICD-10-CM

## 2023-03-05 LAB — CBC
HCT: 37.2 % (ref 36.0–46.0)
Hemoglobin: 12.6 g/dL (ref 12.0–15.0)
MCH: 31 pg (ref 26.0–34.0)
MCHC: 33.9 g/dL (ref 30.0–36.0)
MCV: 91.4 fL (ref 80.0–100.0)
Platelets: 215 10*3/uL (ref 150–400)
RBC: 4.07 MIL/uL (ref 3.87–5.11)
RDW: 13.3 % (ref 11.5–15.5)
WBC: 9.4 10*3/uL (ref 4.0–10.5)
nRBC: 0 % (ref 0.0–0.2)

## 2023-03-05 LAB — URINALYSIS, ROUTINE W REFLEX MICROSCOPIC
Bacteria, UA: NONE SEEN
Bilirubin Urine: NEGATIVE
Glucose, UA: NEGATIVE mg/dL
Hgb urine dipstick: NEGATIVE
Nitrite: NEGATIVE
Protein, ur: NEGATIVE mg/dL
Specific Gravity, Urine: 1.01 (ref 1.005–1.030)
pH: 5.5 (ref 5.0–8.0)

## 2023-03-05 LAB — COMPREHENSIVE METABOLIC PANEL
ALT: 13 U/L (ref 0–44)
AST: 16 U/L (ref 15–41)
Albumin: 4.3 g/dL (ref 3.5–5.0)
Alkaline Phosphatase: 58 U/L (ref 38–126)
Anion gap: 9 (ref 5–15)
BUN: 21 mg/dL (ref 8–23)
CO2: 25 mmol/L (ref 22–32)
Calcium: 9.1 mg/dL (ref 8.9–10.3)
Chloride: 103 mmol/L (ref 98–111)
Creatinine, Ser: 0.95 mg/dL (ref 0.44–1.00)
GFR, Estimated: >60 mL/min (ref >60)
Glucose, Bld: 93 mg/dL (ref 70–99)
Potassium: 4 mmol/L (ref 3.5–5.1)
Sodium: 137 mmol/L (ref 135–145)
Total Bilirubin: 0.6 mg/dL (ref <1.2)
Total Protein: 7.2 g/dL (ref 6.5–8.1)

## 2023-03-05 LAB — OCCULT BLOOD X 1 CARD TO LAB, STOOL: Fecal Occult Bld: NEGATIVE

## 2023-03-05 LAB — LIPASE, BLOOD: Lipase: 27 U/L (ref 11–51)

## 2023-03-05 NOTE — Discharge Instructions (Signed)
Overall if you develop fever, worsening pain please return for evaluation as we discussed.  We may consider doing a contrasted CT scan with pretreatment at that time.  As we discussed follow-up with your GI doctor, your primary care doctor and have given you number to follow-up with OB/GYN.

## 2023-03-05 NOTE — ED Provider Notes (Signed)
[place] [facility]MENT AT [facility] Provider Note   CSN: [id number] Arrival date & time: 03/05/23  1641     History  Chief Complaint  Patient presents with   Abdominal Pain    Heather Carey is a 72 y.o. female.  Patient is having some drainage from her rectum that is yellowish-brown.  She is not sure if it has come from her rectum or from maybe her bladder.  She does not think that she is having any issues going to the bathroom.  She had this started a couple days ago.  Had a colonoscopy a few weeks ago that was unremarkable.  She is not having any rectal pain or fever or chills.  She has been having some chronic left lower abdominal pain for the last 8 months.  She has a history of IBS, fibromyalgia.  She does have hysterectomy history.  Denies any nausea vomiting.  No diarrhea constipation chest pain shortness of breath.  The history is provided by the patient.       Home Medications Prior to Admission medications   Medication Sig Start Date End Date Taking? Authorizing Provider  acetaminophen (TYLENOL) 500 MG tablet Take 1,000 mg by mouth 2 (two) times daily as needed (migraine headaches).    [provider]  diphenhydrAMINE (BENADRYL) 12.5 MG chewable tablet Chew 25 mg by mouth 2 (two) times daily as needed (allergic reaction).    [provider]  diphenhydrAMINE (BENADRYL) 2 % cream Apply 1 application topically daily as needed for itching (allergic reactions).    [provider]  EPINEPHrine 0.3 mg/0.3 mL IJ SOAJ injection Inject 0.3 mg into the muscle as needed for anaphylaxis. 03/04/21   Derwood Kaplan, MD  famotidine (PEPCID) 20 MG tablet Take 1 tablet (20 mg total) by mouth daily. 12/14/21   Sheilah Pigeon, PA-C  hydrocortisone cream 0.5 % Apply 1 application. topically 2 (two) times daily as needed for itching (bug bites).    [provider]  levothyroxine (SYNTHROID) 88 MCG tablet Take 88 mcg by mouth daily before  breakfast.    [provider]  meloxicam (MOBIC) 7.5 MG tablet Take 7.5 mg by mouth daily. Patient not taking: Reported on 02/08/2023 12/07/21   [provider]  metoprolol succinate (TOPROL-XL) 25 MG 24 hr tablet TAKE 1/2 TABLET (12.5 MG TOTAL) BY MOUTH 2 (TWO) TIMES DAILY. 02/27/23   Marinus Maw, MD  nitroGLYCERIN (NITROSTAT) 0.4 MG SL tablet Place 1 tablet (0.4 mg total) under the tongue every 5 (five) minutes as needed for chest pain. 12/14/21 03/14/22  Sheilah Pigeon, PA-C      Allergies    Azithromycin, Codeine, Ivp dye [iodinated contrast media], Natamycin, Other, Rose, Shellfish allergy, Thimerosol [thimerosal (thiomersal)], Adhesive [tape], Avelox [moxifloxacin hcl in nacl], Bacitracin, Bacitracin-polymyxin b, Definity [perflutren lipid microsphere], Doxycycline, Ginger, Neomycin, Onion, Raspberry, Sunscreens, Sunscreens [aminobenzoate], Watermelon [citrullus vulgaris], Benzalkonium chloride, Cephalosporins, and Penicillins    Review of Systems   Review of Systems  Physical Exam Updated Vital Signs BP 114/80 (BP Location: Right Arm)   Pulse 68   Temp 98.2 F (36.8 C) (Oral)   Resp 18   Wt 88 kg   SpO2 99%   BMI 32.28 kg/m  Physical Exam Vitals and nursing note reviewed. Exam conducted with a chaperone present.  Constitutional:      General: She is not in acute distress.    Appearance: She is well-developed. She is not ill-appearing.  HENT:     Head:  Normocephalic and atraumatic.     Mouth/Throat:     Mouth: Mucous membranes are moist.  Eyes:     Extraocular Movements: Extraocular movements intact.     Conjunctiva/sclera: Conjunctivae normal.  Cardiovascular:     Rate and Rhythm: Normal rate and regular rhythm.     Heart sounds: Normal heart sounds. No murmur heard. Pulmonary:     Effort: Pulmonary effort is normal. No respiratory distress.     Breath sounds: Normal breath sounds.  Abdominal:     Palpations: Abdomen is soft.     Tenderness:  There is no abdominal tenderness.  Genitourinary:    Vagina: Normal. No vaginal discharge.     Comments: External GU exam does not show any significant findings, I do not see any drainage from the vaginal area or the rectal area there is no evidence of fistula in the rectal area, skin broken down around the rectal area with some rawness but there is no purulent drainage or fluctuance or abscess, there is no rectal tenderness on exam and stool was grossly brown Musculoskeletal:        General: No swelling.     Cervical back: Neck supple.  Skin:    General: Skin is warm and dry.     Capillary Refill: Capillary refill takes less than 2 seconds.  Neurological:     Mental Status: She is alert.  Psychiatric:        Mood and Affect: Mood normal.     ED Results / Procedures / Treatments   Labs (all labs ordered are listed, but only abnormal results are displayed) Labs Reviewed  URINALYSIS, ROUTINE W REFLEX MICROSCOPIC - Abnormal; Notable for the following components:      Result Value   Ketones, ur TRACE (*)    Leukocytes,Ua SMALL (*)    All other components within normal limits  URINE CULTURE  LIPASE, BLOOD  COMPREHENSIVE METABOLIC PANEL  CBC  OCCULT BLOOD X 1 CARD TO LAB, STOOL    EKG None  Radiology CT ABDOMEN PELVIS WO CONTRAST  Result Date: 03/05/2023 CLINICAL DATA:  Left lower quadrant abdominal pain for 8 months. EXAM: CT ABDOMEN AND PELVIS WITHOUT CONTRAST TECHNIQUE: Multidetector CT imaging of the abdomen and pelvis was performed following the standard protocol without IV contrast. RADIATION DOSE REDUCTION: This exam was performed according to the departmental dose-optimization program which includes automated exposure control, adjustment of the mA and/or kV according to patient size and/or use of iterative reconstruction technique. COMPARISON:  01/09/2023 FINDINGS: Lower chest: The lung bases are clear of acute process. No pleural effusion or pulmonary lesions. The heart is  normal in size. No pericardial effusion. Small hiatal hernia. Hepatobiliary: No worrisome hepatic lesions are identified without contrast. No intrahepatic biliary dilatation. The gallbladder is unremarkable. No common bile duct dilatation. Pancreas: Unremarkable. No pancreatic ductal dilatation or surrounding inflammatory changes. Spleen: Normal in size without focal abnormality. Adrenals/Urinary Tract: Adrenal glands are normal. No worrisome renal lesions, renal calculi or hydronephrosis. Stable bilateral parapelvic renal cysts not requiring any further imaging evaluation or follow-up. The ureters are unremarkable. No bladder mass or bladder calculi. Stomach/Bowel: Small hiatal hernia. Otherwise, the stomach is unremarkable. The duodenum and small are grossly normal. No inflammatory changes or obstructive findings. No colonic mass or colonic obstruction. Stable diffuse colonic diverticulosis without findings for acute diverticulitis. Vascular/Lymphatic: Stable atherosclerotic calcifications involving the aorta and branch vessel ostia. No aneurysm. No mesenteric or retroperitoneal pelvic or inguinal adenopathy. Reproductive: Surgically absent. Other: No pelvic mass  or adenopathy. No free pelvic fluid collections. No inguinal mass or adenopathy. No abdominal wall hernia or subcutaneous lesions. Musculoskeletal: No significant bony findings. Stable Tarlov cyst S2. IMPRESSION: 1. No acute abdominal/pelvic findings, mass lesions or adenopathy. 2. Stable diffuse colonic diverticulosis without findings for acute diverticulitis. 3. Stable bilateral parapelvic renal cysts not requiring any further imaging evaluation or follow-up. 4. Small hiatal hernia. 5. Aortic atherosclerosis. Aortic Atherosclerosis (ICD10-I70.0). Electronically Signed   By: Rudie Meyer M.D.   On: 03/05/2023 18:33    Procedures Procedures    Medications Ordered in ED Medications - No data to display  ED Course/ Medical Decision Making/ A&P                                  Medical Decision Making Amount and/or Complexity of Data Reviewed Labs: ordered. Radiology: ordered.   Heather Carey is here with abdominal pain, drainage from her rectum.  Normal vitals.  No fever.  He has been dealing with some chronic abdominal pain for the last few months.  She had a colonoscopy 3 weeks ago had some polyps and some ulcers but overall was unremarkable.  History of diverticulosis.  She has a history of IBS and fibromyalgia.  Overall her exam is unremarkable.  Differential could be diverticulitis or may be urinary tract infection.  I have very low suspicion for other infectious process.  She has allergy to contrast and will get a noncontrasted CT scan.  I did talk with Dr. Cliffton Asters with surgery who reviewed the CT scan and overall he did not see anything concerning from his and.  Will await radiology report.  I do not think she has a fistula and I do not think that there is a rectal abscess on exam and clinically as she does not have a fever or white count or tenderness of the rectum.  This could be some overflow incontinence from her bladder given that she has had a hysterectomy.  She has been wearing depends and overall looks like from what I see is clear yellow fluid.  She states sometimes it is brownish.  Overall lab work is unremarkable thus far.  Awaiting urinalysis and CT scan abdomen and pelvis.  I will refer her to her OB/GYN to further evaluate and her primary care doctor if these studies are unremarkable.  My suspicion is that this is overflow incontinence from her bladder.  Per my review and interpretation labs no significant anemia or electrolyte abnormality kidney injury or leukocytosis.  Urinalysis negative for infection.  CT scan per radiology report is also unremarkable.  At this time I have very low suspicion for infectious process.  No concern for rectal abscess.  We discussed how this was not a contrasted study and maybe we could be missing  a small rectal abscess but at this time clinically I have low suspicion she has no fever or white count.  She has no rectal pain and did not have any rectal pain on exam to palpation.  I think this could be some overflow incontinence from her bladder.  This could be her IBS related process.  She already has follow-up with her GI will have her follow-up with OB/GYN and her primary care doctor as well.  Overall we had a prolonged discussion about things.  I do not think there is an infectious process.  I think we have some time to figure this out outpatient and she  understands to return if she develops fever, worsening/consistent pain.  We may need to do pretreatment and study at that time.  But overall I think she safe for discharge and outpatient follow-up.  This chart was dictated using voice recognition software.  Despite best efforts to proofread,  errors can occur which can change the documentation meaning.         Final Clinical Impression(s) / ED Diagnoses Final diagnoses:  Abdominal pain, unspecified abdominal location    Rx / DC Orders ED Discharge Orders     None         Virgina Norfolk, DO 03/05/23 2029

## 2023-03-05 NOTE — ED Triage Notes (Signed)
Pt reports brown and yellow drainage , unsure if it is rectal , sts it is not diarrhea . Reports it goes through her pads and bed .  Blood tinged today .  LLQ pain x 8 months , had colonoscopy  11/05.  Denies rectal pain but described as " raw there"

## 2023-03-06 DIAGNOSIS — R238 Other skin changes: Secondary | ICD-10-CM | POA: Diagnosis not present

## 2023-03-07 LAB — URINE CULTURE: Culture: <10000 — AB

## 2023-03-08 DIAGNOSIS — R1032 Left lower quadrant pain: Secondary | ICD-10-CM | POA: Diagnosis not present

## 2023-03-08 DIAGNOSIS — K200 Eosinophilic esophagitis: Secondary | ICD-10-CM | POA: Diagnosis not present

## 2023-03-08 DIAGNOSIS — R198 Other specified symptoms and signs involving the digestive system and abdomen: Secondary | ICD-10-CM | POA: Diagnosis not present

## 2023-03-08 DIAGNOSIS — Z8719 Personal history of other diseases of the digestive system: Secondary | ICD-10-CM | POA: Diagnosis not present

## 2023-03-13 ENCOUNTER — Other Ambulatory Visit: Payer: Self-pay | Admitting: Gastroenterology

## 2023-03-13 ENCOUNTER — Inpatient Hospital Stay: Admission: RE | Admit: 2023-03-13 | Payer: Medicare Other | Source: Ambulatory Visit

## 2023-03-13 DIAGNOSIS — R1030 Lower abdominal pain, unspecified: Secondary | ICD-10-CM | POA: Diagnosis not present

## 2023-03-13 DIAGNOSIS — R198 Other specified symptoms and signs involving the digestive system and abdomen: Secondary | ICD-10-CM

## 2023-03-14 ENCOUNTER — Other Ambulatory Visit (HOSPITAL_BASED_OUTPATIENT_CLINIC_OR_DEPARTMENT_OTHER): Payer: Self-pay

## 2023-03-14 DIAGNOSIS — L258 Unspecified contact dermatitis due to other agents: Secondary | ICD-10-CM | POA: Diagnosis not present

## 2023-03-14 MED ORDER — FLUTICASONE PROPIONATE 0.05 % EX CREA
1.0000 | TOPICAL_CREAM | Freq: Two times a day (BID) | CUTANEOUS | 3 refills | Status: DC
  Filled 2023-03-14: qty 60, 30d supply, fill #0

## 2023-03-15 ENCOUNTER — Other Ambulatory Visit (HOSPITAL_BASED_OUTPATIENT_CLINIC_OR_DEPARTMENT_OTHER): Payer: Self-pay

## 2023-03-15 ENCOUNTER — Other Ambulatory Visit (HOSPITAL_COMMUNITY): Payer: Self-pay | Admitting: Urology

## 2023-03-15 ENCOUNTER — Other Ambulatory Visit: Payer: Self-pay

## 2023-03-15 ENCOUNTER — Other Ambulatory Visit (HOSPITAL_COMMUNITY): Payer: Self-pay

## 2023-03-15 DIAGNOSIS — N3945 Continuous leakage: Secondary | ICD-10-CM

## 2023-03-15 MED ORDER — VANCOMYCIN HCL 125 MG PO CAPS
125.0000 mg | ORAL_CAPSULE | Freq: Four times a day (QID) | ORAL | 0 refills | Status: DC
  Filled 2023-03-15: qty 32, 8d supply, fill #0
  Filled 2023-03-15: qty 8, 2d supply, fill #0
  Filled 2023-03-15: qty 40, 10d supply, fill #0

## 2023-03-16 ENCOUNTER — Other Ambulatory Visit: Payer: Self-pay

## 2023-03-16 ENCOUNTER — Other Ambulatory Visit (HOSPITAL_BASED_OUTPATIENT_CLINIC_OR_DEPARTMENT_OTHER): Payer: Self-pay

## 2023-03-24 DIAGNOSIS — A498 Other bacterial infections of unspecified site: Secondary | ICD-10-CM | POA: Diagnosis not present

## 2023-03-24 DIAGNOSIS — R1032 Left lower quadrant pain: Secondary | ICD-10-CM | POA: Diagnosis not present

## 2023-03-24 DIAGNOSIS — Z8719 Personal history of other diseases of the digestive system: Secondary | ICD-10-CM | POA: Diagnosis not present

## 2023-03-24 DIAGNOSIS — R198 Other specified symptoms and signs involving the digestive system and abdomen: Secondary | ICD-10-CM | POA: Diagnosis not present

## 2023-03-24 DIAGNOSIS — K200 Eosinophilic esophagitis: Secondary | ICD-10-CM | POA: Diagnosis not present

## 2023-03-28 ENCOUNTER — Other Ambulatory Visit: Payer: Medicare Other

## 2023-04-17 NOTE — Progress Notes (Deleted)
 [facility] - Clinic Note  04/19/2023   CHIEF COMPLAINT Patient presents for No chief complaint on file.  HISTORY OF PRESENT ILLNESS: Heather Carey is a 73 y.o. female who presents to the clinic today for:   Patient states when she looks at the wall in her bedroom she sees a block circle. The black circle tracks with vision.   Referring physician: Clem Brands, PA-C  [facility] [address] [place],  [place] [address]  HISTORICAL INFORMATION:  Selected notes from the MEDICAL RECORD NUMBER Clem Brands, PA-C for concern of lamellar hole LEE:  Ocular Hx- PMH-   CURRENT MEDICATIONS: No current outpatient medications on file. (Ophthalmic Drugs)   No current facility-administered medications for this visit. (Ophthalmic Drugs)   Current Outpatient Medications (Other)  Medication Sig   acetaminophen  (TYLENOL ) 500 MG tablet Take 1,000 mg by mouth 2 (two) times daily as needed (migraine headaches).   diphenhydrAMINE  (BENADRYL ) 12.5 MG chewable tablet Chew 25 mg by mouth 2 (two) times daily as needed (allergic reaction).   diphenhydrAMINE  (BENADRYL ) 2 % cream Apply 1 application topically daily as needed for itching (allergic reactions).   EPINEPHrine  0.3 mg/0.3 mL IJ SOAJ injection Inject 0.3 mg into the muscle as needed for anaphylaxis.   famotidine  (PEPCID ) 20 MG tablet Take 1 tablet (20 mg total) by mouth daily.   fluticasone  (CUTIVATE ) 0.05 % cream Apply 1 Application topically 2 (two) times daily as needed. Apply to affected areas on groin.   hydrocortisone  cream 0.5 % Apply 1 application. topically 2 (two) times daily as needed for itching (bug bites).   levothyroxine  (SYNTHROID ) 88 MCG tablet Take 88 mcg by mouth daily before breakfast.   meloxicam (MOBIC) 7.5 MG tablet Take 7.5 mg by mouth daily. (Patient not taking: Reported on 02/08/2023)   metoprolol  succinate (TOPROL -XL) 25 MG 24 hr tablet TAKE 1/2 TABLET (12.5 MG TOTAL) BY  MOUTH 2 (TWO) TIMES DAILY.   nitroGLYCERIN  (NITROSTAT ) 0.4 MG SL tablet Place 1 tablet (0.4 mg total) under the tongue every 5 (five) minutes as needed for chest pain.   vancomycin  (VANCOCIN ) 125 MG capsule Take 1 capsule (125 mg total) by mouth every 6 (six) hours for 10 days.   No current facility-administered medications for this visit. (Other)   REVIEW OF SYSTEMS:   ALLERGIES Allergies  Allergen Reactions   Azithromycin Anaphylaxis   Codeine Shortness Of Breath   Ivp Dye [Iodinated Contrast Media] Anaphylaxis   Natamycin Other (See Comments)    Throat Swelling   Other Anaphylaxis, Hives, Swelling and Other (See Comments)    Irregular heart beat Triple Antibiotic Ointment Nuts - Anaphylaxis  Berries - itching and swelling    Rose Hives   Shellfish Allergy  Anaphylaxis   Thimerosol [Thimerosal (Thiomersal)] Anaphylaxis   Adhesive [Tape] Hives    Paper tape is ok   Avelox [Moxifloxacin Hcl In Nacl] Other (See Comments)    Irregular heart beat   Bacitracin Hives and Itching   Bacitracin-Polymyxin B     Other reaction(s): hives   Definity  [Perflutren  Lipid Microsphere] Other (See Comments)    Shakiness, numbness in extremities, headache, SVT, hallucinations    Doxycycline Hives and Swelling    Facial swelling   Ginger Hives and Swelling   Neomycin Hives   Onion Swelling    MOUTH SWELLING   Raspberry Itching and Swelling   Sunscreens     Other reaction(s): HIVES/THROAT SWELLING   Sunscreens [Aminobenzoate] Hives and Swelling  Avobenzene and Oxybenzene   Watermelon [Citrullus Vulgaris]     Tongue swelling    Benzalkonium Chloride Rash   Cephalosporins Hives and Rash   Penicillins Other (See Comments)    Childhood allergy  Has patient had a PCN reaction causing immediate rash, facial/tongue/throat swelling, SOB or lightheadedness with hypotension: Unknown Has patient had a PCN reaction causing severe rash involving mucus membranes or skin necrosis: Unknown Has  patient had a PCN reaction that required hospitalization: No Has patient had a PCN reaction occurring within the last 10 years: No If all of the above answers are NO, then may proceed with Cephalosporin use.    PAST MEDICAL HISTORY Past Medical History:  Diagnosis Date   Diverticulitis    Eosinophilic esophagitis    Fibromyalgia    GERD (gastroesophageal reflux disease)    Hyperlipidemia    Hypothyroidism    IBS (irritable bowel syndrome)    Migraines    Mitral regurgitation 02/28/2014   Mild to moderate by echo 02/2014   Obesity    Osteoarthritis    Otitis externa    Palpitations    TIA (transient ischemic attack)    Past Surgical History:  Procedure Laterality Date   ABDOMINAL HYSTERECTOMY     BREAST BIOPSY Left 11/02/2022   MM LT BREAST BX W LOC DEV 1ST LESION IMAGE BX SPEC STEREO GUIDE 11/02/2022 [facility]   carpel tunnel     lbp gso     LOOP RECORDER INSERTION N/A 05/08/2017   Procedure: LOOP RECORDER INSERTION;  Surgeon: Waddell Danelle ORN, MD;  Location: [facility]AS[facility];  Service: Cardiovascular;  Laterality: N/A;   TONSILLECTOMY AND ADENOIDECTOMY     FAMILY HISTORY Family History  Problem Relation Age of Onset   Allergic rhinitis Mother    Urticaria Mother    Allergic rhinitis Father    Asthma Father    Allergic rhinitis Sister    Angioedema Sister    Urticaria Sister    Allergic rhinitis Brother    Asthma Brother    SOCIAL HISTORY Social History   Tobacco Use   Smoking status: Never   Smokeless tobacco: Never  Vaping Use   Vaping status: Never Used  Substance Use Topics   Alcohol use: Not Currently    Alcohol/week: 1.0 standard drink of alcohol    Types: 1 Glasses of wine per week   Drug use: No       OPHTHALMIC EXAM:  Not recorded    IMAGING AND PROCEDURES  Imaging and Procedures for 04/19/2023         ASSESSMENT/PLAN:   ICD-10-CM   1. Visual disturbance  H53.9     2. History of migraine headaches  Z86.69     3. 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 PVD (posterior vitreous detachment), right  H43.811     5. Combined forms of age-related cataract of both eyes  H25.813      1,2. Visual disturbance  - pt reports episodes of black circle in vision, occurring at night time when looking at blinds or light walls -- pt unsure if coming from OD or OS  - pt reports h/o migraines w/ colored zig zag lines  - BCVA 20/20 OU and dilated exam without findings to explain symptoms  - suspect ocular migraine variant  - no retinal or ophthalmic interventions indicated or recommended   - f/u 4-6 weeks DFE, OCT, FA transit OD  3. Retinal cysts OD - OCT OD  shows Central foveal notch, trace cystic changes temporal fovea  - unclear etiology - will monitor for now as vision 20/20 - f/u in 4-6 wks -- DFE/OCT, FA transit OD  4. PVD / vitreous syneresis, OD  - Discussed findings and prognosis  - No RT or RD on 360 peripheral exam  - Reviewed s/s of RT/RD  - Strict return precautions for any such RT/RD signs/symptoms  5. Mixed Cataract OU - The symptoms of cataract, surgical options, and treatments and risks were discussed with patient. - discussed diagnosis and progression - monitor   Ophthalmic Meds Ordered this visit:  No orders of the defined types were placed in this encounter.    No follow-ups on file.  There are no Patient Instructions on file for this visit.  Explained the diagnoses, plan, and follow up with the patient and they expressed understanding.  Patient expressed understanding of the importance of proper follow up care.   This document serves as a record of services personally performed by Redell JUDITHANN Hans, MD, PhD. It was created on their behalf by Alan PARAS. Delores, OA an ophthalmic technician. The creation of this record is the provider's dictation and/or activities during the visit.    Electronically signed by: Alan PARAS. Delores, OA 04/17/23 2:04 PM   Redell JUDITHANN Hans, M.D., Ph.D. Diseases &  Surgery of the Retina and Vitreous [facility] 04/19/2023     Abbreviations: M myopia (nearsighted); A astigmatism; H hyperopia (farsighted); P presbyopia; Mrx spectacle prescription;  CTL contact lenses; OD right eye; OS left eye; OU both eyes  XT exotropia; ET esotropia; PEK punctate epithelial keratitis; PEE punctate epithelial erosions; DES dry eye syndrome; MGD meibomian gland dysfunction; ATs artificial tears; PFAT's preservative free artificial tears; NSC nuclear sclerotic cataract; PSC posterior subcapsular cataract; ERM epi-retinal membrane; PVD posterior vitreous detachment; RD retinal detachment; DM diabetes mellitus; DR diabetic retinopathy; NPDR non-proliferative diabetic retinopathy; PDR proliferative diabetic retinopathy; CSME clinically significant macular edema; DME diabetic macular edema; dbh dot blot hemorrhages; CWS cotton wool spot; POAG primary open angle glaucoma; C/D cup-to-disc ratio; HVF humphrey visual field; GVF goldmann visual field; OCT optical coherence tomography; IOP intraocular pressure; BRVO Branch retinal vein occlusion; CRVO central retinal vein occlusion; CRAO central retinal artery occlusion; BRAO branch retinal artery occlusion; RT retinal tear; SB scleral buckle; PPV pars plana vitrectomy; VH Vitreous hemorrhage; PRP panretinal laser photocoagulation; IVK intravitreal kenalog; VMT vitreomacular traction; MH Macular hole;  NVD neovascularization of the disc; NVE neovascularization elsewhere; AREDS age related eye disease study; ARMD age related macular degeneration; POAG primary open angle glaucoma; EBMD epithelial/anterior basement membrane dystrophy; ACIOL anterior chamber intraocular lens; IOL intraocular lens; PCIOL posterior chamber intraocular lens; Phaco/IOL phacoemulsification with intraocular lens placement; PRK photorefractive keratectomy; LASIK laser assisted in situ keratomileusis; HTN hypertension; DM diabetes mellitus; COPD chronic  obstructive pulmonary disease

## 2023-04-19 ENCOUNTER — Encounter (INDEPENDENT_AMBULATORY_CARE_PROVIDER_SITE_OTHER): Payer: Self-pay

## 2023-04-19 ENCOUNTER — Encounter (INDEPENDENT_AMBULATORY_CARE_PROVIDER_SITE_OTHER): Payer: Self-pay | Admitting: Ophthalmology

## 2023-04-19 ENCOUNTER — Encounter (INDEPENDENT_AMBULATORY_CARE_PROVIDER_SITE_OTHER): Payer: Medicare Other | Admitting: Ophthalmology

## 2023-04-19 ENCOUNTER — Ambulatory Visit (INDEPENDENT_AMBULATORY_CARE_PROVIDER_SITE_OTHER): Payer: Medicare Other | Admitting: Ophthalmology

## 2023-04-19 VITALS — BP 160/101 | HR 92

## 2023-04-19 DIAGNOSIS — H539 Unspecified visual disturbance: Secondary | ICD-10-CM | POA: Diagnosis not present

## 2023-04-19 DIAGNOSIS — Z8669 Personal history of other diseases of the nervous system and sense organs: Secondary | ICD-10-CM

## 2023-04-19 DIAGNOSIS — H43811 Vitreous degeneration, right eye: Secondary | ICD-10-CM | POA: Diagnosis not present

## 2023-04-19 DIAGNOSIS — H25813 Combined forms of age-related cataract, bilateral: Secondary | ICD-10-CM

## 2023-04-19 DIAGNOSIS — H33191 Other retinoschisis and retinal cysts, right eye: Secondary | ICD-10-CM | POA: Diagnosis not present

## 2023-04-19 NOTE — Progress Notes (Signed)
 [facility] - Clinic Note  04/19/2023   CHIEF COMPLAINT Patient presents for Retina Follow Up  HISTORY OF PRESENT ILLNESS: Heather Carey is a 73 y.o. female who presents to the clinic today for:  HPI     Retina Follow Up   Patient presents with  Other.  In both eyes.  Duration of 3 months.  I, the attending physician,  performed the HPI with the patient and updated documentation appropriately.        Comments   Patient feels the vision is blurry. About 3 weeks ago she said she had an eye infection in the right eye. She is not using eye drops.      Last edited by Valdemar Rogue, MD on 04/19/2023  5:41 PM.    Patient states the vision is about the same. She states that she is getting more sleep since her last visit. She is complaining that she had an eye infection in the right eye - self medicated.  Referring physician: Clem Brands, PA-C  [facility] [address] [place],  [place] [address]  HISTORICAL INFORMATION:  Selected notes from the MEDICAL RECORD NUMBER Clem Brands, PA-C for concern of lamellar hole LEE:  Ocular Hx- PMH-   CURRENT MEDICATIONS: No current outpatient medications on file. (Ophthalmic Drugs)   No current facility-administered medications for this visit. (Ophthalmic Drugs)   Current Outpatient Medications (Other)  Medication Sig   acetaminophen  (TYLENOL ) 500 MG tablet Take 1,000 mg by mouth 2 (two) times daily as needed (migraine headaches).   diphenhydrAMINE  (BENADRYL ) 12.5 MG chewable tablet Chew 25 mg by mouth 2 (two) times daily as needed (allergic reaction).   diphenhydrAMINE  (BENADRYL ) 2 % cream Apply 1 application topically daily as needed for itching (allergic reactions).   EPINEPHrine  0.3 mg/0.3 mL IJ SOAJ injection Inject 0.3 mg into the muscle as needed for anaphylaxis.   famotidine  (PEPCID ) 20 MG tablet Take 1 tablet (20 mg total) by mouth daily.   fluticasone  (CUTIVATE ) 0.05 % cream Apply 1  Application topically 2 (two) times daily as needed. Apply to affected areas on groin.   hydrocortisone  cream 0.5 % Apply 1 application. topically 2 (two) times daily as needed for itching (bug bites).   levothyroxine  (SYNTHROID ) 88 MCG tablet Take 88 mcg by mouth daily before breakfast.   meloxicam (MOBIC) 7.5 MG tablet Take 7.5 mg by mouth daily.   metoprolol  succinate (TOPROL -XL) 25 MG 24 hr tablet TAKE 1/2 TABLET (12.5 MG TOTAL) BY MOUTH 2 (TWO) TIMES DAILY.   nitroGLYCERIN  (NITROSTAT ) 0.4 MG SL tablet Place 1 tablet (0.4 mg total) under the tongue every 5 (five) minutes as needed for chest pain.   vancomycin  (VANCOCIN ) 125 MG capsule Take 1 capsule (125 mg total) by mouth every 6 (six) hours for 10 days.   No current facility-administered medications for this visit. (Other)   REVIEW OF SYSTEMS: ROS   Positive for: Cardiovascular, Eyes Last edited by Myra Wanda SAILOR, COT on 04/19/2023  9:39 AM.      ALLERGIES Allergies  Allergen Reactions   Azithromycin Anaphylaxis   Codeine Shortness Of Breath   Ivp Dye [Iodinated Contrast Media] Anaphylaxis   Natamycin Other (See Comments)    Throat Swelling   Other Anaphylaxis, Hives, Swelling and Other (See Comments)    Irregular heart beat Triple Antibiotic Ointment Nuts - Anaphylaxis  Berries - itching and swelling    Rose Hives   Shellfish Allergy  Anaphylaxis   Thimerosol [  Thimerosal (Thiomersal)] Anaphylaxis   Adhesive [Tape] Hives    Paper tape is ok   Avelox [Moxifloxacin Hcl In Nacl] Other (See Comments)    Irregular heart beat   Bacitracin Hives and Itching   Bacitracin-Polymyxin B     Other reaction(s): hives   Definity  [Perflutren  Lipid Microsphere] Other (See Comments)    Shakiness, numbness in extremities, headache, SVT, hallucinations    Doxycycline Hives and Swelling    Facial swelling   Ginger Hives and Swelling   Neomycin Hives   Onion Swelling    MOUTH SWELLING   Raspberry Itching and Swelling   Sunscreens      Other reaction(s): HIVES/THROAT SWELLING   Sunscreens [Aminobenzoate] Hives and Swelling    Avobenzene and Oxybenzene   Watermelon [Citrullus Vulgaris]     Tongue swelling    Benzalkonium Chloride Rash   Cephalosporins Hives and Rash   Penicillins Other (See Comments)    Childhood allergy  Has patient had a PCN reaction causing immediate rash, facial/tongue/throat swelling, SOB or lightheadedness with hypotension: Unknown Has patient had a PCN reaction causing severe rash involving mucus membranes or skin necrosis: Unknown Has patient had a PCN reaction that required hospitalization: No Has patient had a PCN reaction occurring within the last 10 years: No If all of the above answers are NO, then may proceed with Cephalosporin use.    PAST MEDICAL HISTORY Past Medical History:  Diagnosis Date   Diverticulitis    Eosinophilic esophagitis    Fibromyalgia    GERD (gastroesophageal reflux disease)    Hyperlipidemia    Hypothyroidism    IBS (irritable bowel syndrome)    Migraines    Mitral regurgitation 02/28/2014   Mild to moderate by echo 02/2014   Obesity    Osteoarthritis    Otitis externa    Palpitations    TIA (transient ischemic attack)    Past Surgical History:  Procedure Laterality Date   ABDOMINAL HYSTERECTOMY     BREAST BIOPSY Left 11/02/2022   MM LT BREAST BX W LOC DEV 1ST LESION IMAGE BX SPEC STEREO GUIDE 11/02/2022 [facility]   carpel tunnel     lbp gso     LOOP RECORDER INSERTION N/A 05/08/2017   Procedure: LOOP RECORDER INSERTION;  Surgeon: Waddell Danelle ORN, MD;  Location: [facility];  Service: Cardiovascular;  Laterality: N/A;   TONSILLECTOMY AND ADENOIDECTOMY     FAMILY HISTORY Family History  Problem Relation Age of Onset   Allergic rhinitis Mother    Urticaria Mother    Allergic rhinitis Father    Asthma Father    Allergic rhinitis Sister    Angioedema Sister    Urticaria Sister    Allergic rhinitis Brother    Asthma Brother     SOCIAL HISTORY Social History   Tobacco Use   Smoking status: Never   Smokeless tobacco: Never  Vaping Use   Vaping status: Never Used  Substance Use Topics   Alcohol use: Not Currently    Alcohol/week: 1.0 standard drink of alcohol    Types: 1 Glasses of wine per week   Drug use: No       OPHTHALMIC EXAM:  Base Eye Exam     Visual Acuity (Snellen - Linear)       Right Left   Dist cc 20/25 20/20   Dist ph cc NI          Tonometry (Tonopen, 9:40 AM)       Right Left  Pressure 12 14         Pupils       Dark Light Shape React APD   Right 3 2 Round Brisk None   Left 3 2 Round Brisk None         Visual Fields       Left Right    Full Full         Extraocular Movement       Right Left    Full, Ortho Full, Ortho         Neuro/Psych     Oriented x3: Yes   Mood/Affect: Normal         Dilation     Both eyes: 1.0% Mydriacyl, 2.5% Phenylephrine @ 9:40 AM           Slit Lamp and Fundus Exam     External Exam       Right Left   External Normal Normal         Slit Lamp Exam       Right Left   Lids/Lashes Dermatochalasis - upper lid, Meibomian gland dysfunction Dermatochalasis - upper lid, Meibomian gland dysfunction   Conjunctiva/Sclera White and quiet White and quiet   Cornea trace Punctate epithelial erosions, trace Debris in tear film trace Punctate epithelial erosions, trace Debris in tear film   Anterior Chamber Deep, 1+ fine cell/pigment Deep, 1+ fine cell/pigment   Iris Round and dilated Round and dilated   Lens 2-3+ Nuclear sclerosis, 2+ Cortical cataract 2-3+ Nuclear sclerosis, 2+ Cortical cataract   Anterior Vitreous Vitreous syneresis, Posterior vitreous detachment Vitreous syneresis, Vitreous condensations         Fundus Exam       Right Left   Disc Pink and sharp, Compact Pink and sharp, Compact   C/D Ratio 0.2 0.1   Macula Flat, Blunted foveal reflex, RPE mottling, No heme or edema, trace cystic changes  Flat, Blunted foveal reflex, RPE mottling, No heme or edema   Vessels Vascular attenuation, Tortuous Vascular attenuation, Tortuous   Periphery Attached, No heme Attached, No heme           IMAGING AND PROCEDURES  Imaging and Procedures for 04/19/2023  OCT, Retina - OU - Both Eyes       Right Eye Quality was good. Central Foveal Thickness: 259. Progression has been stable. Findings include normal foveal contour (Central foveal notch, trace cystic changes temporal fovea-- stable).   Left Eye Quality was good. Central Foveal Thickness: 256. Progression has been stable. Findings include normal foveal contour, no IRF, no SRF, vitreomacular adhesion .   Notes *Images captured and stored on drive  Diagnosis / Impression:  OD: Central foveal notch, trace cystic changes temporal fovea OS: NFP; no IRF/SRF   Clinical management:  See below  Abbreviations: NFP - Normal foveal profile. CME - cystoid macular edema. PED - pigment epithelial detachment. IRF - intraretinal fluid. SRF - subretinal fluid. EZ - ellipsoid zone. ERM - epiretinal membrane. ORA - outer retinal atrophy. ORT - outer retinal tubulation. SRHM - subretinal hyper-reflective material. IRHM - intraretinal hyper-reflective material           ASSESSMENT/PLAN:   ICD-10-CM   1. Visual disturbance  H53.9 OCT, Retina - OU - Both Eyes    2. History of migraine headaches  Z86.69     3. Retinal cyst of right eye  H33.191 OCT, Retina - OU - Both Eyes    4. PVD (posterior vitreous detachment), right  H43.811  OCT, Retina - OU - Both Eyes    5. Combined forms of age-related cataract of both eyes  H25.813      ** patient defers FA due to being allergic to so many things, afraid of having a reaction**  1,2. Visual disturbance - pt reports h/o episodes of black circle in vision, occurring at night time when looking at blinds or light walls -- pt unsure if coming from OD or OS - patient states the disturbances have gone away  and she is sleeping better.  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 Retinal cysts OD - OCT OD shows Central foveal notch, trace cystic changes temporal fovea -- stable - unclear etiology, but not likely visually significant - no retinal or ophthalmic interventions indicated or recommended  - monitor  4. PVD / vitreous syneresis, OD  - Discussed findings and prognosis  - No RT or RD on 360 peripheral exam  - Reviewed s/s of RT/RD  - Strict return precautions for any such RT/RD signs/symptoms  5. Mixed Cataract OU - The symptoms of cataract, surgical options, and treatments and risks were discussed with patient. - discussed diagnosis and progression - under the expert care of [facility]  Ophthalmic Meds Ordered this visit:  No orders of the defined types were placed in this encounter.    Return if symptoms worsen or fail to improve.  There are no Patient Instructions on file for this visit.  Explained the diagnoses, plan, and follow up with the patient and they expressed understanding.  Patient expressed understanding of the importance of proper follow up care.   This document serves as a record of services personally performed by Redell JUDITHANN Hans, MD, PhD. It was created on their behalf by Alan PARAS. Delores, OA an ophthalmic technician. The creation of this record is the provider's dictation and/or activities during the visit.    Electronically signed by: Alan PARAS. Delores, OA 04/19/23 5:41 PM  This document serves as a record of services personally performed by Redell JUDITHANN Hans, MD, PhD. It was created on their behalf by Wanda GEANNIE Keens, COT an ophthalmic technician. The creation of this record is the provider's dictation and/or activities during the visit.     Electronically signed by:  Wanda GEANNIE Keens, COT  04/19/23 5:41 PM  Redell JUDITHANN Hans, M.D., Ph.D. Diseases & Surgery of the Retina and Vitreous [facility] 04/19/2023  I have reviewed the above documentation for accuracy and completeness, and I agree with the above. Redell JUDITHANN Hans, M.D., Ph.D. 04/19/23 5:44 PM   Abbreviations: M myopia (nearsighted); A astigmatism; H hyperopia (farsighted); P presbyopia; Mrx spectacle prescription;  CTL contact lenses; OD right eye; OS left eye; OU both eyes  XT exotropia; ET esotropia; PEK punctate epithelial keratitis; PEE punctate epithelial erosions; DES dry eye syndrome; MGD meibomian gland dysfunction; ATs artificial tears; PFAT's preservative free artificial tears; NSC nuclear sclerotic cataract; PSC posterior subcapsular cataract; ERM epi-retinal membrane; PVD posterior vitreous detachment; RD retinal detachment; DM diabetes mellitus; DR diabetic retinopathy; NPDR non-proliferative diabetic retinopathy; PDR proliferative diabetic retinopathy; CSME clinically significant macular edema; DME diabetic macular edema; dbh dot blot hemorrhages; CWS cotton wool spot; POAG primary open angle glaucoma; C/D cup-to-disc  ratio; HVF humphrey visual field; GVF goldmann visual field; OCT optical coherence tomography; IOP intraocular pressure; BRVO Branch retinal vein occlusion; CRVO central retinal vein occlusion; CRAO central retinal artery occlusion; BRAO branch retinal artery occlusion; RT retinal tear; SB scleral buckle; PPV pars plana vitrectomy; VH Vitreous hemorrhage; PRP panretinal laser photocoagulation; IVK intravitreal kenalog; VMT vitreomacular traction; MH Macular hole;  NVD neovascularization of the disc; NVE neovascularization elsewhere; AREDS age related eye disease study; ARMD age related macular degeneration; POAG primary open angle glaucoma; EBMD epithelial/anterior basement membrane dystrophy; ACIOL anterior chamber intraocular  lens; IOL intraocular lens; PCIOL posterior chamber intraocular lens; Phaco/IOL phacoemulsification with intraocular lens placement; PRK photorefractive keratectomy; LASIK laser assisted in situ keratomileusis; HTN hypertension; DM diabetes mellitus; COPD chronic obstructive pulmonary disease

## 2023-04-22 NOTE — Progress Notes (Signed)
 This encounter was created in error - please disregard.

## 2023-05-10 ENCOUNTER — Other Ambulatory Visit (HOSPITAL_BASED_OUTPATIENT_CLINIC_OR_DEPARTMENT_OTHER): Payer: Self-pay

## 2023-05-10 DIAGNOSIS — J029 Acute pharyngitis, unspecified: Secondary | ICD-10-CM | POA: Diagnosis not present

## 2023-05-10 DIAGNOSIS — H9202 Otalgia, left ear: Secondary | ICD-10-CM | POA: Diagnosis not present

## 2023-05-22 DIAGNOSIS — Z881 Allergy status to other antibiotic agents status: Secondary | ICD-10-CM | POA: Diagnosis not present

## 2023-05-22 DIAGNOSIS — J020 Streptococcal pharyngitis: Secondary | ICD-10-CM | POA: Diagnosis not present

## 2023-05-22 DIAGNOSIS — J029 Acute pharyngitis, unspecified: Secondary | ICD-10-CM | POA: Diagnosis not present

## 2023-06-20 ENCOUNTER — Ambulatory Visit: Payer: Medicare Other | Attending: Internal Medicine | Admitting: Internal Medicine

## 2023-06-20 ENCOUNTER — Ambulatory Visit (INDEPENDENT_AMBULATORY_CARE_PROVIDER_SITE_OTHER)

## 2023-06-20 ENCOUNTER — Encounter: Payer: Self-pay | Admitting: Internal Medicine

## 2023-06-20 VITALS — BP 116/68 | HR 76 | Ht 65.5 in | Wt 196.0 lb

## 2023-06-20 DIAGNOSIS — R002 Palpitations: Secondary | ICD-10-CM | POA: Diagnosis not present

## 2023-06-20 DIAGNOSIS — I471 Supraventricular tachycardia, unspecified: Secondary | ICD-10-CM | POA: Diagnosis not present

## 2023-06-20 NOTE — Patient Instructions (Addendum)
 Medication Instructions:  Your physician recommends that you continue on your current medications as directed. Please refer to the Current Medication list given to you today.  *If you need a refill on your cardiac medications before your next appointment, please call your pharmacy*  Lab Work: None ordered.  You may go to any [facility] Location for your lab work:  [facility] - [address] ([facility]) - [address] [phone] [address]  [place] - [address]   [place]  - [address]   [place] - [address] - [address] [employer]  - [address] - [address] ([facility]   If you have labs (blood work) drawn today and your tests are completely normal, you will receive your results only by: [employer] (if you have MyChart)  If you have any lab test that is abnormal or we need to change your treatment, we will call you or send a MyChart message to review the results.  Testing/Procedures: Your physician has requested that you wear a Zio heart monitor for 7 days. This will be mailed to your home with instructions on how to apply the monitor and how to return it when finished. Please allow 2 weeks after returning the heart monitor before our office calls you with the results.   Follow-Up: At [facility], you and your health needs are our priority.  As part of our continuing mission to provide you with exceptional heart care, we have created designated Provider Care Teams.  These Care Teams include your primary Cardiologist (physician) and Advanced Practice Providers (APPs -  Physician Assistants and Nurse Practitioners) who all work together to provide you with the care you need, when you need it.   Your next appointment:   To be determined after testing  The format for your next appointment:   In Person  Provider:   Lewayne Bunting, MD{or  one of the following Advanced Practice Providers on your designated Care Team:   Francis Dowse, [place] Casimiro Needle "Mardelle Matte" [place], [place] Earnest Rosier, NP  Note: Remote monitoring is used to monitor your Pacemaker/ ICD from home. This monitoring reduces the number of office visits required to check your device to one time per year. It allows [place] to keep an eye on the functioning of your device to ensure it is working properly.            Valet parking services will be available as well.    ZIO XT- Long Term Monitor Instructions     Your physician has requested you wear a ZIO patch monitor for 7 days.  This is a single patch monitor. [employer] supplies one patch monitor per enrollment. Additional  stickers are not available. Please do not apply patch if you will be having a Nuclear Stress Test,  Echocardiogram, Cardiac CT, MRI, or Chest Xray during the period you would be wearing the  monitor. The patch cannot be worn during these tests. You cannot remove and re-apply the  ZIO XT patch monitor.  Your ZIO patch monitor will be mailed 3 day USPS to your address on file. It may take 3-5 days  to receive your monitor after you have been enrolled.  Once you have received your monitor, please review the enclosed instructions. Your monitor  has already been registered assigning a specific monitor serial # to you.  Billing and Patient Assistance Program Information     We have supplied [employer] with any of your insurance information on file for billing purposes.  [employer] offers a sliding scale Patient Assistance Program for patients that do not have  insurance, or whose insurance does not completely cover the cost of the ZIO monitor.  You must apply for the Patient Assistance Program to qualify for this discounted rate.  To apply, please call [employer] at [phone], select option 4, select option 2, ask to apply for  Patient Assistance Program. Meredeth Ide will ask your household income, and how many people   are in your household. They will quote your out-of-pocket cost based on that information.  [employer] will also be able to set up a 93-month, interest-free payment plan if needed.     Applying the monitor     Shave hair from upper left chest.  Hold abrader disc by orange tab. Rub abrader in 40 strokes over the upper left chest as  indicated in your monitor instructions.  Clean area with 4 enclosed alcohol pads. Let dry.  Apply patch as indicated in monitor instructions. Patch will be placed under collarbone on left  side of chest with arrow pointing upward.  Rub patch adhesive wings for 2 minutes. Remove white label marked "1". Remove the white  label marked "2". Rub patch adhesive wings for 2 additional minutes.  While looking in a mirror, press and release button in center of patch. A small green light will  flash 3-4 times. This will be your only indicator that the monitor has been turned on.  Do not shower for the first 24 hours. You may shower after the first 24 hours.  Press the button if you feel a symptom. You will hear a small click. Record Date, Time and  Symptom in the Patient Logbook.  When you are ready to remove the patch, follow instructions on the last 2 pages of Patient  Logbook. Stick patch monitor onto the last page of Patient Logbook.  Place Patient Logbook in the blue and white box. Use locking tab on box and tape box closed  securely. The blue and white box has prepaid postage on it. Please place it in the mailbox as  soon as possible. Your physician should have your test results approximately 7 days after the  monitor has been mailed back to [facility].  Call [facility] Customer Care at [phone] if you have questions regarding  your ZIO XT patch monitor. Call them immediately if you see an orange light blinking on your  monitor.  If your monitor falls off in less than 4 days, contact our Monitor department at [phone].  If your monitor becomes loose  or falls off after 4 days call [employer] at [phone] for  suggestions on securing your monitor.

## 2023-06-20 NOTE — Progress Notes (Unsigned)
 Enrolled for [employer] to mail a ZIO XT long term holter monitor to the patients address on file.

## 2023-06-20 NOTE — Progress Notes (Signed)
 HPI Heather Carey returns today for ongoing evaluation and management of her SVT. She has a remote h/o cryptogenic stroke and is s/p ILR insertion. She has episodes of SVT which she can usually terminate with vagal maneuvers. She has had problems with sob with exertion. She denies dietary indiscretion. No edema. No syncope. Her SVT has been stable and controlled. Her ILR was removed when I saw her last in 2022. In the interim she notes an episode of diarrhea due to C. Diff, a strep infection followed by influenza, and now she notes palpitations with HR's even at night in the 130 range. She has almost nightly episodes of palpitations and is having to sleep on her back.  Allergies  Allergen Reactions   Azithromycin Anaphylaxis   Codeine Shortness Of Breath   Ivp Dye [Iodinated Contrast Media] Anaphylaxis   Natamycin Other (See Comments)    Throat Swelling   Other Anaphylaxis, Hives, Swelling and Other (See Comments)    Irregular heart beat Triple Antibiotic Ointment Nuts - Anaphylaxis  Berries - itching and swelling    Rose Hives   Shellfish Allergy Anaphylaxis   Thimerosol [Thimerosal (Thiomersal)] Anaphylaxis   Adhesive [Tape] Hives    Paper tape is ok   Avelox [Moxifloxacin Hcl In Nacl] Other (See Comments)    Irregular heart beat   Bacitracin Hives and Itching   Bacitracin-Polymyxin B     Other reaction(s): hives   Definity [Perflutren Lipid Microsphere] Other (See Comments)    Shakiness, numbness in extremities, headache, SVT, hallucinations    Doxycycline Hives and Swelling    Facial swelling   Ginger Hives and Swelling   Neomycin Hives   Onion Swelling    MOUTH SWELLING   Raspberry Itching and Swelling   Sunscreens     Other reaction(s): HIVES/THROAT SWELLING   Sunscreens [Aminobenzoate] Hives and Swelling    Avobenzene and Oxybenzene   Watermelon [Citrullus Vulgaris]     Tongue swelling    Benzalkonium Chloride Rash   Cephalosporins Hives and Rash   Penicillins  Other (See Comments)    Childhood allergy Has patient had a PCN reaction causing immediate rash, facial/tongue/throat swelling, SOB or lightheadedness with hypotension: Unknown Has patient had a PCN reaction causing severe rash involving mucus membranes or skin necrosis: Unknown Has patient had a PCN reaction that required hospitalization: No Has patient had a PCN reaction occurring within the last 10 years: No If all of the above answers are "NO", then may proceed with Cephalosporin use.      Current Outpatient Medications  Medication Sig Dispense Refill   acetaminophen (TYLENOL) 500 MG tablet Take 1,000 mg by mouth 2 (two) times daily as needed (migraine headaches).     diphenhydrAMINE (BENADRYL) 12.5 MG chewable tablet Chew 25 mg by mouth 2 (two) times daily as needed (allergic reaction).     diphenhydrAMINE (BENADRYL) 2 % cream Apply 1 application topically daily as needed for itching (allergic reactions).     EPINEPHrine 0.3 mg/0.3 mL IJ SOAJ injection Inject 0.3 mg into the muscle as needed for anaphylaxis. 2 each 0   hydrocortisone cream 0.5 % Apply 1 application. topically 2 (two) times daily as needed for itching (bug bites).     levothyroxine (SYNTHROID) 88 MCG tablet Take 88 mcg by mouth daily before breakfast.     metoprolol succinate (TOPROL-XL) 25 MG 24 hr tablet TAKE 1/2 TABLET (12.5 MG TOTAL) BY MOUTH 2 (TWO) TIMES DAILY. 90 tablet 2   nitroGLYCERIN (NITROSTAT)  0.4 MG SL tablet Place 1 tablet (0.4 mg total) under the tongue every 5 (five) minutes as needed for chest pain. 25 tablet 3   No current facility-administered medications for this visit.     Past Medical History:  Diagnosis Date   Diverticulitis    Eosinophilic esophagitis    Fibromyalgia    GERD (gastroesophageal reflux disease)    Hyperlipidemia    Hypothyroidism    IBS (irritable bowel syndrome)    Migraines    Mitral regurgitation 02/28/2014   Mild to moderate by echo 02/2014   Obesity     Osteoarthritis    Otitis externa    Palpitations    TIA (transient ischemic attack)     ROS:   All systems reviewed and negative except as noted in the HPI.   Past Surgical History:  Procedure Laterality Date   ABDOMINAL HYSTERECTOMY     BREAST BIOPSY Left 11/02/2022   MM LT BREAST BX W LOC DEV 1ST LESION IMAGE BX SPEC STEREO GUIDE 11/02/2022 [facility]   carpel tunnel     lbp gso     LOOP RECORDER INSERTION N/A 05/08/2017   Procedure: LOOP RECORDER INSERTION;  Surgeon: Marinus Maw, MD;  Location: [facility]VASIVE [facility];  Service: Cardiovascular;  Laterality: N/A;   TONSILLECTOMY AND ADENOIDECTOMY       Family History  Problem Relation Age of Onset   Allergic rhinitis Mother    Urticaria Mother    Allergic rhinitis Father    Asthma Father    Allergic rhinitis Sister    Angioedema Sister    Urticaria Sister    Allergic rhinitis Brother    Asthma Brother      Social History   Socioeconomic History   Marital status: Married    Spouse name: Not on file   Number of children: Not on file   Years of education: Not on file   Highest education level: Not on file  Occupational History   Not on file  Tobacco Use   Smoking status: Never   Smokeless tobacco: Never  Vaping Use   Vaping status: Never Used  Substance and Sexual Activity   Alcohol use: Not Currently    Alcohol/week: 1.0 standard drink of alcohol    Types: 1 Glasses of wine per week   Drug use: No   Sexual activity: Yes  Other Topics Concern   Not on file  Social History Narrative   Not on file   Social Drivers of Health   Financial Resource Strain: Not on file  Food Insecurity: Not on file  Transportation Needs: Not on file  Physical Activity: Not on file  Stress: Not on file  Social Connections: Not on file  Intimate Partner Violence: Not on file     BP 116/68   Pulse 76   Ht 5' 5.5" (1.664 m)   Wt 196 lb (88.9 kg)   SpO2 95%   BMI 32.12 kg/m   Physical Exam:  Well appearing  NAD HEENT: Unremarkable Neck:  No JVD, no thyromegally Lymphatics:  No adenopathy Back:  No CVA tenderness Lungs:  Clear with no wheezes HEART:  Regular rate rhythm, no murmurs, no rubs, no clicks Abd:  soft, positive bowel sounds, no organomegally, no rebound, no guarding Ext:  2 plus pulses, no edema, no cyanosis, no clubbing Skin:  No rashes no nodules Neuro:  CN II through XII intact, motor grossly intact  EKG - nsr  DEVICE  Normal device function.  See PaceArt for details.   Assess/Plan: SVT - unclear if her current symptoms are SVT or sinus tachy. We will have her wear a zio monitor and additional recs based on the results. Cryptogenic stroke - no explanation. We will follow.  Sharlot Gowda Abeer Iversen,MD

## 2023-06-21 ENCOUNTER — Encounter: Payer: Self-pay | Admitting: Internal Medicine

## 2023-06-21 ENCOUNTER — Other Ambulatory Visit: Payer: Self-pay

## 2023-06-21 ENCOUNTER — Ambulatory Visit: Payer: Medicare Other | Admitting: Internal Medicine

## 2023-06-21 VITALS — BP 106/62 | HR 91 | Temp 97.9°F | Resp 18 | Ht 65.5 in | Wt 197.0 lb

## 2023-06-21 DIAGNOSIS — Z880 Allergy status to penicillin: Secondary | ICD-10-CM

## 2023-06-21 DIAGNOSIS — K200 Eosinophilic esophagitis: Secondary | ICD-10-CM

## 2023-06-21 DIAGNOSIS — J3089 Other allergic rhinitis: Secondary | ICD-10-CM

## 2023-06-21 DIAGNOSIS — L500 Allergic urticaria: Secondary | ICD-10-CM | POA: Diagnosis not present

## 2023-06-21 DIAGNOSIS — T7800XD Anaphylactic reaction due to unspecified food, subsequent encounter: Secondary | ICD-10-CM | POA: Diagnosis not present

## 2023-06-21 DIAGNOSIS — Z881 Allergy status to other antibiotic agents status: Secondary | ICD-10-CM

## 2023-06-21 DIAGNOSIS — T7800XA Anaphylactic reaction due to unspecified food, initial encounter: Secondary | ICD-10-CM

## 2023-06-21 MED ORDER — EPINEPHRINE 0.3 MG/0.3ML IJ SOAJ: 0.3000 mg | INTRAMUSCULAR | 1 refills | Status: AC | PRN

## 2023-06-21 MED ORDER — CETIRIZINE HCL 10 MG PO TABS: 10.0000 mg | ORAL_TABLET | Freq: Two times a day (BID) | ORAL | 5 refills | Status: AC | PRN

## 2023-06-21 MED ORDER — FLUTICASONE PROPIONATE 50 MCG/ACT NA SUSP: 2.0000 | Freq: Every day | NASAL | 5 refills | Status: DC

## 2023-06-21 NOTE — Progress Notes (Signed)
 NEW PATIENT  Date of Service/Encounter:  06/21/23  Consult requested by: Sigmund Hazel, MD   Subjective:   Heather Carey (DOB: 1950/10/09) is a 73 y.o. female who presents to the clinic on 06/21/2023 with a chief complaint of Advice Only .    History obtained from: chart review and patient.   Hives/Swelling Drug Allergy Reports having history of frequent hives in childhood and sometimes breaks out randomly now also.  More frequently though, since 2015-2016, she has had outbreaks of facial swelling.  Randomly wakes up with it.  No other symptoms.  Has noticed some correlation with barometric pressure changes.  Tested for alpha gal and was negative.   Reports multiple drug allergies, most recently Cefdinir where she was given that for strep throat but one morning woke up with facial swelling and was told to stop it.  She isn't sure if this is the medication since she has chronic trouble with hives/swelling.  Has a hx of penicillin allergy, can't recall exact reaction but chart review lists nonspecific rash.   EoE? Reports history of EoE, followed with [facility].   Has had trouble with ulcers on EGD.   Trouble with choking and feeling of phlegm bubbles when she eats.   Not taking Omeprazole/Pepcid as recommended by GI.  She does note they are planning on performing EGD soon.  Tolerates baked chicken, carrots, broccoli, cabbage, biscuits, banana, apple, milk  Food Reactions:  2019 had throat swelling and hives with lobster  2006 had throat swelling/hives with mixed nuts  Scrambled eggs causes diarrhea, tolerated baked eggs.  Fresh fruits causes mouth itching, rarely tongue swelling. Does fine with fruits cooked/baked into things.   Rhinitis:  Started many years ago.  Symptoms include: nasal congestion, rhinorrhea, and post nasal drainage  Occurs year-round with seasonal flares Fall/Spring Potential triggers: not sure, pollen   Treatments tried:  Anti histamines PRN   Previous  allergy testing: yes around 2012; can't recall results  History of sinus surgery: no Nonallergic triggers: none    Reviewed:  AEC 2021-2023: 0-400   06/20/2023: seen by Cardiology for SVT/palpitations. Planning for zio patch.   05/30/2023: referred to Allergy by Deboraha Sprang PCP for multiple drug allergies. Has hx of rash with penicillin, eye swelling with cefdinir, chest tightness/heart arrythmia with azithromycin, doxycycline with hives/swelling   01/2015: negative ANA, normal tryptase, normal CU index, negative alpha gal, normal C4  01/2015: negative shellfish, peanut, treenuts, various fruits/vegetables/meats  01/07/2015: seen by Dr Nunzio Cobbs for food allergies, lip/tongue swelling and hives.  SPT to foods and environmental allergens were all negative.    2017: seen by GI Dr Tenna Child for dysphagia, history of Schatzki ring s/p dilation.    Past Medical History: Past Medical History:  Diagnosis Date   Diverticulitis    Eosinophilic esophagitis    Fibromyalgia    GERD (gastroesophageal reflux disease)    Hyperlipidemia    Hypothyroidism    IBS (irritable bowel syndrome)    Migraines    Mitral regurgitation 02/28/2014   Mild to moderate by echo 02/2014   Obesity    Osteoarthritis    Otitis externa    Palpitations    TIA (transient ischemic attack)     Past Surgical History: Past Surgical History:  Procedure Laterality Date   ABDOMINAL HYSTERECTOMY     BREAST BIOPSY Left 11/02/2022   MM LT BREAST BX W LOC DEV 1ST LESION IMAGE BX SPEC STEREO GUIDE 11/02/2022 [facility]   carpel tunnel  lbp gso     LOOP RECORDER INSERTION N/A 05/08/2017   Procedure: LOOP RECORDER INSERTION;  Surgeon: Marinus Maw, MD;  Location: [facility] INVASIVE CV LAB;  Service: Cardiovascular;  Laterality: N/A;   TONSILLECTOMY AND ADENOIDECTOMY      Family History: Family History  Problem Relation Age of Onset   Allergic rhinitis Mother    Urticaria Mother    Allergic rhinitis Father    Asthma  Father    Allergic rhinitis Sister    Angioedema Sister    Urticaria Sister    Allergic rhinitis Brother    Asthma Brother     Social History:  Flooring in bedroom: carpet Pets: none Tobacco use/exposure: none Job: retired   Medication List:  Allergies as of 06/21/2023       Reactions   Azithromycin Anaphylaxis   Codeine Shortness Of Breath   Ivp Dye [iodinated Contrast Media] Anaphylaxis   Natamycin Other (See Comments)   Throat Swelling   Other Anaphylaxis, Hives, Swelling, Other (See Comments)   Irregular heart beat Triple Antibiotic Ointment Nuts - Anaphylaxis Berries - itching and swelling    Rose Hives   Shellfish Allergy Anaphylaxis   Thimerosol [thimerosal (thiomersal)] Anaphylaxis   Adhesive [tape] Hives   Paper tape is ok   Avelox [moxifloxacin Hcl In Nacl] Other (See Comments)   Irregular heart beat   Bacitracin Hives, Itching   Bacitracin-polymyxin B    Other reaction(s): hives   Definity [perflutren Lipid Microsphere] Other (See Comments)   Shakiness, numbness in extremities, headache, SVT, hallucinations    Doxycycline Hives, Swelling   Facial swelling   Ginger Hives, Swelling   Neomycin Hives   Onion Swelling   MOUTH SWELLING   Raspberry Itching, Swelling   Sunscreens    Other reaction(s): HIVES/THROAT SWELLING   Sunscreens [aminobenzoate] Hives, Swelling   Avobenzene and Oxybenzene   Watermelon [citrullus Vulgaris]    Tongue swelling    Benzalkonium Chloride Rash   Cephalosporins Hives, Rash   Penicillins Other (See Comments)   Childhood allergy Has patient had a PCN reaction causing immediate rash, facial/tongue/throat swelling, SOB or lightheadedness with hypotension: Unknown Has patient had a PCN reaction causing severe rash involving mucus membranes or skin necrosis: Unknown Has patient had a PCN reaction that required hospitalization: No Has patient had a PCN reaction occurring within the last 10 years: No If all of the above answers  are "NO", then may proceed with Cephalosporin use.        Medication List        Accurate as of June 21, 2023 12:20 PM. If you have any questions, ask your nurse or doctor.          acetaminophen 500 MG tablet Commonly known as: TYLENOL Take 1,000 mg by mouth 2 (two) times daily as needed (migraine headaches).   diphenhydrAMINE 12.5 MG chewable tablet Commonly known as: BENADRYL Chew 25 mg by mouth 2 (two) times daily as needed (allergic reaction).   EPINEPHrine 0.3 mg/0.3 mL Soaj injection Commonly known as: EPI-PEN Inject 0.3 mg into the muscle as needed for anaphylaxis.   hydrocortisone cream 0.5 % Apply 1 application. topically 2 (two) times daily as needed for itching (bug bites).   levothyroxine 88 MCG tablet Commonly known as: SYNTHROID Take 88 mcg by mouth daily before breakfast.   metoprolol succinate 25 MG 24 hr tablet Commonly known as: TOPROL-XL TAKE 1/2 TABLET (12.5 MG TOTAL) BY MOUTH 2 (TWO) TIMES DAILY.   nitroGLYCERIN 0.4 MG SL  tablet Commonly known as: NITROSTAT Place 1 tablet (0.4 mg total) under the tongue every 5 (five) minutes as needed for chest pain.         REVIEW OF SYSTEMS: Pertinent positives and negatives discussed in HPI.   Objective:   Physical Exam: BP 106/62 (BP Location: Right Arm, Patient Position: Sitting, Cuff Size: Normal)   Pulse 91   Temp 97.9 F (36.6 C) (Temporal)   Resp 18   Ht 5' 5.5" (1.664 m)   Wt 197 lb (89.4 kg)   SpO2 99%   BMI 32.28 kg/m  Body mass index is 32.28 kg/m. GEN: alert, well developed HEENT: clear conjunctiva, nose with + mild inferior turbinate hypertrophy, pink nasal mucosa, slight clear rhinorrhea, + cobblestoning HEART: regular rate and rhythm, no murmur LUNGS: clear to auscultation bilaterally, no coughing, unlabored respiration ABDOMEN: soft, non distended  SKIN: no rashes or lesions  Assessment:   1. Allergic urticaria   2. Other allergic rhinitis   3. Eosinophilic  esophagitis   4. Allergy status to penicillin   5. 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   - Initial rxn: facial swelling with cefdinir, unclear reaction with penicillin, possibly rash.  - Recommend direct amoxicillin challenge as she is low risk based on history.  Would need to hold anti histamines 3 days prior.   - Once we sort the amoxicillin situation, we will discuss Cefdinir challenge.   Urticaria/Angioedema (Hives/Swelling): - At this time etiology of hives and swelling is unknown. Hives can be caused by a variety of different triggers including illness/infection, pressure, vibrations, extremes of temperature to name a few however majority of the time there is no identifiable trigger.  - Start Zyrtec 10mg  daily. - If hives/swelling recur, increase to Zyrtec 10mg  twice daily.   Other Allergic Rhinitis: - Use nasal saline rinses before nose sprays such as with Neilmed Sinus Rinse.  Use distilled water.   - Use Flonase 1-2 sprays each nostril daily. Aim upward and outward. - Use Zyrtec 10 mg daily.   Eosinophilic Esophagitis? - Will obtain records from [facility] regarding most recent notes, EGD and pathology.  - Agree with restarting PPI and Pepcid as recommended by GI.   Food Allergy:  - please strictly avoid shellfish, peanut, treenut,  - Initial rxn: throat swelling and hives  - for SKIN only reaction, okay to take Benadryl 25mg  capsules every 6 hours as needed - for SKIN + ANY additional symptoms, OR IF concern for LIFE THREATENING reaction = Epipen Autoinjector EpiPen 0.3 mg. - If using Epinephrine autoinjector, call 911 or go to the ER.   Food Intolerance - Diarrhea with eggs.  Continue eating baked eggs.   Oral Allergy Syndrome- Fresh  Fruits  - These symptoms are typically not life-threatening and are because of a cross reaction between a pollen you are allergic to, and to a protein in specific foods (such as fresh fruits, vegetables, and nuts). - If you can eat these things and tolerate the symptoms, it is fine to continue to do so.  If not, you may avoid these fresh fruits and vegetables.   - Heating these foods, buying them canned, and peeling these foods should allow them to be consumed without symptoms or with less symptoms. - Patients typically  report itching and/or mild swelling of the mouth and throat immediately following ingestion of certain uncooked fruits (including nuts) or raw vegetables.  - Only a very small number of affected individuals experience systemic allergic reactions, such as anaphylaxis which occurs with true food allergies.    Hold all anti-histamines (Xyzal, Allegra, Zyrtec, Claritin, Benadryl, Pepcid) 3 days prior to next testing visit.  She will call back to set up testing.   Follow up: 1) skin testing when available 1-68 2) direct amoxicillin challenge when available     Alesia Morin, MD [facility]

## 2023-06-21 NOTE — Patient Instructions (Addendum)
 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   - Initial rxn: facial swelling with cefdinir, unclear reaction with penicillin.  - Recommend direct amoxicillin challenge.  Would need to hold anti histamines 3 days prior.   - Once we sort the amoxicillin situation, we will discuss Cefdinir challenge.   Urticaria/Angioedema (Hives/Swelling): - At this time etiology of hives and swelling is unknown. Hives can be caused by a variety of different triggers including illness/infection, pressure, vibrations, extremes of temperature to name a few however majority of the time there is no identifiable trigger.  - Start Zyrtec 10mg  daily. - If hives/swelling recur, increase to Zyrtec 10mg  twice daily.   Other Allergic Rhinitis: - Use nasal saline rinses before nose sprays such as with Neilmed Sinus Rinse.  Use distilled water.   - Use Flonase 1-2 sprays each nostril daily. Aim upward and outward. - Use Zyrtec 10 mg daily.   Eosinophilic Esophagitis? - Will obtain records from [facility] regarding most recent notes, EGD and pathology.  - Agree with restarting PPI and Pepcid as recommended by GI.   Food Allergy:  - please strictly avoid shellfish, peanut, treenut,  - Initial rxn: throat swelling and hives  - for SKIN only reaction, okay to take Benadryl 25mg  capsules every 6 hours as needed - for SKIN + ANY additional symptoms, OR IF concern for LIFE THREATENING reaction = Epipen Autoinjector EpiPen 0.3 mg. - If using Epinephrine autoinjector, call 911 or go to the ER.   Food Intolerance - Diarrhea with eggs.  Continue eating baked eggs.   Oral Allergy Syndrome- Fresh Fruits  - These symptoms are typically not life-threatening and are because of a cross reaction between a pollen you are allergic to, and to a protein in specific foods (such as fresh fruits,  vegetables, and nuts). - If you can eat these things and tolerate the symptoms, it is fine to continue to do so.  If not, you may avoid these fresh fruits and vegetables.   - Heating these foods, buying them canned, and peeling these foods should allow them to be consumed without symptoms or with less symptoms. - Patients typically report itching and/or mild swelling of the mouth and throat immediately following ingestion of certain uncooked fruits (including nuts) or raw vegetables.  - Only a very small number of affected individuals experience systemic allergic reactions, such as anaphylaxis which occurs with true food allergies.    Hold all anti-histamines (Xyzal, Allegra, Zyrtec, Claritin, Benadryl, Pepcid) 3 days prior to next testing visit.   Follow up: 1) skin testing when available 1-68 2) direct amoxicillin challenge when available

## 2023-07-03 ENCOUNTER — Ambulatory Visit: Payer: Medicare Other | Admitting: Internal Medicine

## 2023-07-06 ENCOUNTER — Telehealth: Payer: Self-pay | Admitting: Internal Medicine

## 2023-07-06 DIAGNOSIS — R002 Palpitations: Secondary | ICD-10-CM | POA: Diagnosis not present

## 2023-07-06 NOTE — Telephone Encounter (Signed)
 Irthythm, calling with abnormal zio: SVT rate of 183bpm for 60 seconds 06/29/23 at 7:27pm. Located on page 14, strip #9.   Monitor already resulted and in chart, but will send to ordering physician to make aware of this event as well. Official report will be addended to include the above.

## 2023-07-06 NOTE — Telephone Encounter (Signed)
 Follow Up:        Heather Carey is calling with an abnormal results

## 2023-07-17 DIAGNOSIS — R002 Palpitations: Secondary | ICD-10-CM | POA: Diagnosis not present

## 2023-08-02 ENCOUNTER — Ambulatory Visit: Attending: Internal Medicine | Admitting: Internal Medicine

## 2023-08-02 VITALS — BP 118/72 | HR 80 | Ht 65.5 in | Wt 197.6 lb

## 2023-08-02 DIAGNOSIS — I341 Nonrheumatic mitral (valve) prolapse: Secondary | ICD-10-CM | POA: Diagnosis not present

## 2023-08-02 DIAGNOSIS — I340 Nonrheumatic mitral (valve) insufficiency: Secondary | ICD-10-CM | POA: Diagnosis not present

## 2023-08-02 DIAGNOSIS — I10 Essential (primary) hypertension: Secondary | ICD-10-CM | POA: Diagnosis not present

## 2023-08-02 DIAGNOSIS — I471 Supraventricular tachycardia, unspecified: Secondary | ICD-10-CM | POA: Diagnosis not present

## 2023-08-02 NOTE — Patient Instructions (Addendum)
 Medication Instructions:  Your physician recommends that you continue on your current medications as directed. Please refer to the Current Medication list given to you today.  *If you need a refill on your cardiac medications before your next appointment, please call your pharmacy*  Lab Work: None ordered.  You may go to any [facility] Location for your lab work:  [facility] - [address] ([facility]) - [address] [phone]  [place] - [address]   [place]  - [address]   [place] - [address] - [address] [employer]  - [address] [place] - [address] ([facility]   If you have labs (blood work) drawn today and your tests are completely normal, you will receive your results only by: [employer] (if you have MyChart)  If you have any lab test that is abnormal or we need to change your treatment, we will call you or send a MyChart message to review the results.  Testing/Procedures: Dates for SVT ablation : June 27 July 2, 16, 22  Follow-Up: At [facility], you and your health needs are our priority.  As part of our continuing mission to provide you with exceptional heart care, we have created designated Provider Care Teams.  These Care Teams include your primary Cardiologist (physician) and Advanced Practice Providers (APPs -  Physician Assistants and Nurse Practitioners) who all work together to provide you with the care you need, when you need it.  Your next appointment:   To be scheduled  The format for your next appointment:   In Person  Provider:   Manya Sells, MD{or one of the following Advanced Practice Providers on your designated Care Team:   Mertha Abrahams, [place] Bambi Lever "Jonelle Neri" Fox, [place] Neda Balk, NP  Note: Remote monitoring is used to monitor your Pacemaker/ ICD from home. This monitoring reduces the number  of office visits required to check your device to one time per year. It allows us  to keep an eye on the functioning of your device to ensure it is working properly.            Valet parking services will be available as well.

## 2023-08-02 NOTE — Progress Notes (Signed)
 HPI Mrs. Heather Carey returns today for ongoing evaluation and management of her SVT. She has a remote h/o cryptogenic stroke and is s/p ILR insertion. She has episodes of SVT which she can usually terminate with vagal maneuvers. She has had problems with sob with exertion. She denies dietary indiscretion. No edema. No syncope. Her SVT has been stable and controlled. I had her wear a Zio monitor which demonstrated episodes of SVT lasting minutes with rates of 160-200/min. Despite being on toprol  she has had more symptoms. The patient cannot take more toprol  due to hypotension.  Allergies  Allergen Reactions   Azithromycin Anaphylaxis   Codeine Shortness Of Breath   Ivp Dye [Iodinated Contrast Media] Anaphylaxis   Natamycin Other (See Comments)    Throat Swelling   Other Anaphylaxis, Hives, Swelling and Other (See Comments)    Irregular heart beat Triple Antibiotic Ointment Nuts - Anaphylaxis  Berries - itching and swelling    Rose Hives   Shellfish Allergy Anaphylaxis   Thimerosol [Thimerosal (Thiomersal)] Anaphylaxis   Adhesive [Tape] Hives    Paper tape is ok   Avelox [Moxifloxacin Hcl In Nacl] Other (See Comments)    Irregular heart beat   Bacitracin Hives and Itching   Bacitracin-Polymyxin B     Other reaction(s): hives   Definity  [Perflutren  Lipid Microsphere] Other (See Comments)    Shakiness, numbness in extremities, headache, SVT, hallucinations    Doxycycline Hives and Swelling    Facial swelling   Ginger Hives and Swelling   Neomycin Hives   Onion Swelling    MOUTH SWELLING   Raspberry Itching and Swelling   Sunscreens     Other reaction(s): HIVES/THROAT SWELLING   Sunscreens [Aminobenzoate] Hives and Swelling    Avobenzene and Oxybenzene   Watermelon [Citrullus Vulgaris]     Tongue swelling    Benzalkonium Chloride Rash   Cephalosporins Hives and Rash   Penicillins Other (See Comments)    Childhood allergy Has patient had a PCN reaction causing immediate  rash, facial/tongue/throat swelling, SOB or lightheadedness with hypotension: Unknown Has patient had a PCN reaction causing severe rash involving mucus membranes or skin necrosis: Unknown Has patient had a PCN reaction that required hospitalization: No Has patient had a PCN reaction occurring within the last 10 years: No If all of the above answers are "NO", then may proceed with Cephalosporin use.      Current Outpatient Medications  Medication Sig Dispense Refill   levothyroxine  (SYNTHROID ) 88 MCG tablet Take 88 mcg by mouth daily before breakfast.     metoprolol  succinate (TOPROL -XL) 25 MG 24 hr tablet TAKE 1/2 TABLET (12.5 MG TOTAL) BY MOUTH 2 (TWO) TIMES DAILY. 90 tablet 2   acetaminophen  (TYLENOL ) 500 MG tablet Take 1,000 mg by mouth 2 (two) times daily as needed (migraine headaches).     cetirizine  (ZYRTEC  ALLERGY) 10 MG tablet Take 1 tablet (10 mg total) by mouth 2 (two) times daily as needed for allergies (or hives). (Patient not taking: Reported on 08/02/2023) 60 tablet 5   diphenhydrAMINE  (BENADRYL ) 12.5 MG chewable tablet Chew 25 mg by mouth 2 (two) times daily as needed (allergic reaction). (Patient not taking: Reported on 08/02/2023)     EPINEPHrine  0.3 mg/0.3 mL IJ SOAJ injection Inject 0.3 mg into the muscle as needed for anaphylaxis. (Patient not taking: Reported on 08/02/2023) 2 each 1   fluticasone  (FLONASE ) 50 MCG/ACT nasal spray Place 2 sprays into both nostrils daily. (Patient not taking: Reported on 08/02/2023) 16  g 5   hydrocortisone  cream 0.5 % Apply 1 application. topically 2 (two) times daily as needed for itching (bug bites). (Patient not taking: Reported on 08/02/2023)     nitroGLYCERIN  (NITROSTAT ) 0.4 MG SL tablet Place 1 tablet (0.4 mg total) under the tongue every 5 (five) minutes as needed for chest pain. 25 tablet 3   No current facility-administered medications for this visit.     Past Medical History:  Diagnosis Date   Diverticulitis    Eosinophilic  esophagitis    Fibromyalgia    GERD (gastroesophageal reflux disease)    Hyperlipidemia    Hypothyroidism    IBS (irritable bowel syndrome)    Migraines    Mitral regurgitation 02/28/2014   Mild to moderate by echo 02/2014   Obesity    Osteoarthritis    Otitis externa    Palpitations    TIA (transient ischemic attack)     ROS:   All systems reviewed and negative except as noted in the HPI.   Past Surgical History:  Procedure Laterality Date   ABDOMINAL HYSTERECTOMY     BREAST BIOPSY Left 11/02/2022   MM LT BREAST BX W LOC DEV 1ST LESION IMAGE BX SPEC STEREO GUIDE 11/02/2022 [facility]   carpel tunnel     lbp gso     LOOP RECORDER INSERTION N/A 05/08/2017   Procedure: LOOP RECORDER INSERTION;  Surgeon: Tammie Fall, MD;  Location: [facility];  Service: Cardiovascular;  Laterality: N/A;   TONSILLECTOMY AND ADENOIDECTOMY       Family History  Problem Relation Age of Onset   Allergic rhinitis Mother    Urticaria Mother    Allergic rhinitis Father    Asthma Father    Allergic rhinitis Sister    Angioedema Sister    Urticaria Sister    Allergic rhinitis Brother    Asthma Brother      Social History   Socioeconomic History   Marital status: Married    Spouse name: Not on file   Number of children: Not on file   Years of education: Not on file   Highest education level: Not on file  Occupational History   Not on file  Tobacco Use   Smoking status: Never    Passive exposure: Never   Smokeless tobacco: Never  Vaping Use   Vaping status: Never Used  Substance and Sexual Activity   Alcohol use: Not Currently    Alcohol/week: 1.0 standard drink of alcohol    Types: 1 Glasses of wine per week   Drug use: No   Sexual activity: Yes  Other Topics Concern   Not on file  Social History Narrative   Not on file   Social Drivers of Health   Financial Resource Strain: Not on file  Food Insecurity: Not on file  Transportation Needs: Not on file   Physical Activity: Not on file  Stress: Not on file  Social Connections: Not on file  Intimate Partner Violence: Not on file     BP 118/72 (BP Location: Right Arm, Patient Position: Sitting, Cuff Size: Normal)   Pulse 80   Ht 5' 5.5" (1.664 m)   Wt 197 lb 9.6 oz (89.6 kg)   SpO2 98%   BMI 32.38 kg/m   Physical Exam:  Well appearing NAD HEENT: Unremarkable Neck:  No JVD, no thyromegally Lymphatics:  No adenopathy Back:  No CVA tenderness Lungs:  Clear with no wheezes HEART:  Regular rate rhythm, no murmurs, no rubs,  no clicks Abd:  soft, positive bowel sounds, no organomegally, no rebound, no guarding Ext:  2 plus pulses, no edema, no cyanosis, no clubbing Skin:  No rashes no nodules Neuro:  CN II through XII intact, motor grossly intact  EKG - nsr  Assess/Plan:  SVT - She has worn a zio monitor which demonstrates episodes of SVT. I have offered her EP study and catheter ablation and she will call if she wishes to proceed.  Cryptogenic stroke - no explanation. We will follow.   Pete Brand Amaiya Scruton,MD

## 2023-08-08 DIAGNOSIS — S20362A Insect bite (nonvenomous) of left front wall of thorax, initial encounter: Secondary | ICD-10-CM | POA: Diagnosis not present

## 2023-09-20 ENCOUNTER — Other Ambulatory Visit: Payer: Medicare Other

## 2023-09-22 ENCOUNTER — Other Ambulatory Visit: Payer: Self-pay | Admitting: Gastroenterology

## 2023-09-22 DIAGNOSIS — R198 Other specified symptoms and signs involving the digestive system and abdomen: Secondary | ICD-10-CM

## 2023-09-28 ENCOUNTER — Ambulatory Visit
Admission: RE | Admit: 2023-09-28 | Discharge: 2023-09-28 | Disposition: A | Discharge status: Home / Self Care | Source: Ambulatory Visit | Attending: Gastroenterology | Admitting: Gastroenterology

## 2023-09-28 DIAGNOSIS — R198 Other specified symptoms and signs involving the digestive system and abdomen: Secondary | ICD-10-CM

## 2023-10-09 DIAGNOSIS — E039 Hypothyroidism, unspecified: Secondary | ICD-10-CM | POA: Diagnosis not present

## 2023-10-09 DIAGNOSIS — E7800 Pure hypercholesterolemia, unspecified: Secondary | ICD-10-CM | POA: Diagnosis not present

## 2023-10-17 DIAGNOSIS — G43109 Migraine with aura, not intractable, without status migrainosus: Secondary | ICD-10-CM | POA: Diagnosis not present

## 2023-10-17 DIAGNOSIS — R519 Headache, unspecified: Secondary | ICD-10-CM | POA: Diagnosis not present

## 2023-10-17 DIAGNOSIS — H9313 Tinnitus, bilateral: Secondary | ICD-10-CM | POA: Diagnosis not present

## 2023-10-17 DIAGNOSIS — M542 Cervicalgia: Secondary | ICD-10-CM | POA: Diagnosis not present

## 2023-10-18 ENCOUNTER — Other Ambulatory Visit: Payer: Self-pay | Admitting: Family Medicine

## 2023-10-18 DIAGNOSIS — R519 Headache, unspecified: Secondary | ICD-10-CM

## 2023-10-20 ENCOUNTER — Ambulatory Visit
Admission: RE | Admit: 2023-10-20 | Discharge: 2023-10-20 | Disposition: A | Discharge status: Home / Self Care | Source: Ambulatory Visit | Attending: Family Medicine | Admitting: Family Medicine

## 2023-10-20 DIAGNOSIS — I6782 Cerebral ischemia: Secondary | ICD-10-CM | POA: Diagnosis not present

## 2023-10-20 DIAGNOSIS — R519 Headache, unspecified: Secondary | ICD-10-CM

## 2023-10-20 DIAGNOSIS — M542 Cervicalgia: Secondary | ICD-10-CM | POA: Diagnosis not present

## 2023-10-24 DIAGNOSIS — R1030 Lower abdominal pain, unspecified: Secondary | ICD-10-CM | POA: Diagnosis not present

## 2023-10-24 DIAGNOSIS — R198 Other specified symptoms and signs involving the digestive system and abdomen: Secondary | ICD-10-CM | POA: Diagnosis not present

## 2023-10-24 DIAGNOSIS — Z8719 Personal history of other diseases of the digestive system: Secondary | ICD-10-CM | POA: Diagnosis not present

## 2023-10-24 DIAGNOSIS — K200 Eosinophilic esophagitis: Secondary | ICD-10-CM | POA: Diagnosis not present

## 2023-10-24 DIAGNOSIS — Z8619 Personal history of other infectious and parasitic diseases: Secondary | ICD-10-CM | POA: Diagnosis not present

## 2023-11-09 DIAGNOSIS — R519 Headache, unspecified: Secondary | ICD-10-CM | POA: Diagnosis not present

## 2023-11-09 DIAGNOSIS — J029 Acute pharyngitis, unspecified: Secondary | ICD-10-CM | POA: Diagnosis not present

## 2023-11-09 DIAGNOSIS — R110 Nausea: Secondary | ICD-10-CM | POA: Diagnosis not present

## 2023-11-09 DIAGNOSIS — E7800 Pure hypercholesterolemia, unspecified: Secondary | ICD-10-CM | POA: Diagnosis not present

## 2023-11-09 DIAGNOSIS — E039 Hypothyroidism, unspecified: Secondary | ICD-10-CM | POA: Diagnosis not present

## 2023-11-09 DIAGNOSIS — G43109 Migraine with aura, not intractable, without status migrainosus: Secondary | ICD-10-CM | POA: Diagnosis not present

## 2023-11-09 DIAGNOSIS — Z23 Encounter for immunization: Secondary | ICD-10-CM | POA: Diagnosis not present

## 2023-11-14 DIAGNOSIS — K200 Eosinophilic esophagitis: Secondary | ICD-10-CM | POA: Diagnosis not present

## 2023-11-14 DIAGNOSIS — K2289 Other specified disease of esophagus: Secondary | ICD-10-CM | POA: Diagnosis not present

## 2023-11-14 DIAGNOSIS — K317 Polyp of stomach and duodenum: Secondary | ICD-10-CM | POA: Diagnosis not present

## 2023-11-14 DIAGNOSIS — K2210 Ulcer of esophagus without bleeding: Secondary | ICD-10-CM | POA: Diagnosis not present

## 2023-11-14 DIAGNOSIS — K449 Diaphragmatic hernia without obstruction or gangrene: Secondary | ICD-10-CM | POA: Diagnosis not present

## 2023-12-05 DIAGNOSIS — M62830 Muscle spasm of back: Secondary | ICD-10-CM | POA: Diagnosis not present

## 2023-12-05 DIAGNOSIS — R053 Chronic cough: Secondary | ICD-10-CM | POA: Diagnosis not present

## 2023-12-05 DIAGNOSIS — R0789 Other chest pain: Secondary | ICD-10-CM | POA: Diagnosis not present

## 2023-12-05 DIAGNOSIS — J411 Mucopurulent chronic bronchitis: Secondary | ICD-10-CM | POA: Diagnosis not present

## 2023-12-08 DIAGNOSIS — T50905A Adverse effect of unspecified drugs, medicaments and biological substances, initial encounter: Secondary | ICD-10-CM | POA: Diagnosis not present

## 2023-12-08 DIAGNOSIS — R053 Chronic cough: Secondary | ICD-10-CM | POA: Diagnosis not present

## 2023-12-10 DIAGNOSIS — E7800 Pure hypercholesterolemia, unspecified: Secondary | ICD-10-CM | POA: Diagnosis not present

## 2023-12-10 DIAGNOSIS — R053 Chronic cough: Secondary | ICD-10-CM | POA: Diagnosis not present

## 2023-12-10 DIAGNOSIS — R9389 Abnormal findings on diagnostic imaging of other specified body structures: Secondary | ICD-10-CM | POA: Diagnosis not present

## 2023-12-10 DIAGNOSIS — E039 Hypothyroidism, unspecified: Secondary | ICD-10-CM | POA: Diagnosis not present

## 2023-12-13 ENCOUNTER — Telehealth: Payer: Self-pay | Admitting: Internal Medicine

## 2023-12-13 ENCOUNTER — Other Ambulatory Visit: Payer: Self-pay | Admitting: Internal Medicine

## 2023-12-13 NOTE — Telephone Encounter (Signed)
 PT called to set up AMOX challenge, potentially had pneumonia last week and took two doses of doxycycline on 08/27 and woke up with swollen eyes. Her doctor advised that she should get tested for amox as previously discussed - advised PT needs to be off antibiotics at least 7 days prior + 3 days off antihistamines  Scheduled with Tobie on 09/23, and will schedule 1-68 allergy testing on checkout.  PT was advised she would get follow up info from clinical staff on getting AMOX for challenge and additional challenge details

## 2023-12-13 NOTE — Telephone Encounter (Signed)
 Dr Tobie when would you plan to send in Amoxicillin for challenge that is scheduled 9/23? Now or a week before?

## 2023-12-18 NOTE — Telephone Encounter (Signed)
 Spoke with Heather Carey. Since July she has had chest tightness and a wheezy cough coughing up yellow plugs every couple of hours. At that time she was tested for RSV/COVID/FLU all negative and had chest XR which showed Pneumonia and was treated with Doxycycline and ended up having a reaction to that- eye/lip swelling. Had a repeat chest XR and was told she had linear left lower scarring. I scheduled her for tomorrow with Dr Tobie. She was referred to Pulmonary but taking awhile to get in. She is concerned if these sx will interfere with her drug challenge on 9/23.   Just an FYI.

## 2023-12-19 ENCOUNTER — Encounter: Payer: Self-pay | Admitting: Internal Medicine

## 2023-12-19 ENCOUNTER — Ambulatory Visit: Admitting: Internal Medicine

## 2023-12-19 VITALS — BP 122/92 | HR 86 | Temp 98.5°F | Wt 199.0 lb

## 2023-12-19 DIAGNOSIS — R053 Chronic cough: Secondary | ICD-10-CM | POA: Diagnosis not present

## 2023-12-19 DIAGNOSIS — J3089 Other allergic rhinitis: Secondary | ICD-10-CM | POA: Diagnosis not present

## 2023-12-19 DIAGNOSIS — R0789 Other chest pain: Secondary | ICD-10-CM | POA: Diagnosis not present

## 2023-12-19 DIAGNOSIS — K200 Eosinophilic esophagitis: Secondary | ICD-10-CM | POA: Diagnosis not present

## 2023-12-19 DIAGNOSIS — R062 Wheezing: Secondary | ICD-10-CM

## 2023-12-19 MED ORDER — QVAR REDIHALER 40 MCG/ACT IN AERB: 2.0000 | INHALATION_SPRAY | Freq: Two times a day (BID) | RESPIRATORY_TRACT | 1 refills | Status: DC

## 2023-12-19 NOTE — Patient Instructions (Addendum)
 Chronic cough/Chest Tightness -Start Qvar  40mcg 2 puffs twice daily.  Brush, rinse mouth and gargle after use.    Penicillin Allergy Cefdinir Allergy  - Recommend direct amoxicillin challenge.  Would need to hold anti histamines 3 days prior.   - Once we sort the amoxicillin situation, we will discuss Cefdinir challenge.   Urticaria/Angioedema (Hives/Swelling): - At this time etiology of hives and swelling is unknown. Hives can be caused by a variety of different triggers including illness/infection, pressure, vibrations, extremes of temperature to name a few however majority of the time there is no identifiable trigger.  - Use Zyrtec  10mg  daily. - If hives/swelling recur, increase to Zyrtec  10mg  twice daily.   Other Allergic Rhinitis: - Use nasal saline rinses before nose sprays such as with Neilmed Sinus Rinse.  Use distilled water.   - Use Flonase  1-2 sprays each nostril daily. Aim upward and outward. - Use Zyrtec  10 mg daily.   Eosinophilic Esophagitis - Continue follow up with GI.    Food Allergy:  - please strictly avoid shellfish, peanut , treenut,  - for SKIN only reaction, okay to take Benadryl  25mg  capsules every 6 hours as needed - for SKIN + ANY additional symptoms, OR IF concern for LIFE THREATENING reaction = Epipen  Autoinjector EpiPen  0.3 mg. - If using Epinephrine  autoinjector, call 911 or go to the ER.   Food Intolerance - Diarrhea with eggs.  Continue eating baked eggs.   Oral Allergy Syndrome- Fresh Fruits  - These symptoms are typically not life-threatening and are because of a cross reaction between a pollen you are allergic to, and to a protein in specific foods (such as fresh fruits, vegetables, and nuts). - If you can eat these things and tolerate the symptoms, it is fine to continue to do so.  If not, you may avoid these fresh fruits and vegetables.   - Heating these foods, buying them canned, and peeling these foods should allow them to be consumed without  symptoms or with less symptoms. - Patients typically report itching and/or mild swelling of the mouth and throat immediately following ingestion of certain uncooked fruits (including nuts) or raw vegetables.  - Only a very small number of affected individuals experience systemic allergic reactions, such as anaphylaxis which occurs with true food allergies.

## 2023-12-19 NOTE — Progress Notes (Signed)
 FOLLOW UP Date of Service/Encounter:  12/19/23   Subjective:  Heather Carey (DOB: [birth year]) is a 73 y.o. female who [facility] for follow up for an acute visit of cough.   History obtained from: chart review and patient. Last seen by me on 06/21/2023 for urticaria/angioedema (up to BID Zyrtec ), allergic rhinitis (Flonase /Zyrtec ), EoE (f/u with GI and use PPI as Rx).  Also with drug allergies thought to be idiopathic urticaria/angioedema rather than IgE mediated drug allergies.  Also with food intolerance to eggs and PFAS- fresh fruits. Discussed setting up for direct amoxicilling challenge.    Reports on and off chest tightness and cough since Summer.  End of July, got sick with an URI and had viral testing that was negative.  August also noted some small yellow plugs in cough.  Hears rattling/wheezing when coughing.  2 weeks ago, went to urgent care and they thought she had pneumonia (CXR with linear scarring).  Started on doxycycline and noted to have eye swelling/throat swelling so she only took 2 doses of this.  Repeat CXR was the same, no new development.  Never had fevers.  Some hoarseness also.   Does not wish to trial beta agonist inhaler due to hx of SVT.   Also notes some post nasal drip, not using Flonase /Zyrtec . Seen by GI for EoE with hx of ulceration on EGD.  Notes also having GERD.   Past Medical History: Past Medical History:  Diagnosis Date   Diverticulitis    Eosinophilic esophagitis    Fibromyalgia    GERD (gastroesophageal reflux disease)    Hyperlipidemia    Hypothyroidism    IBS (irritable bowel syndrome)    Migraines    Mitral regurgitation 02/28/2014   Mild to moderate by echo 02/2014   Obesity    Osteoarthritis    Otitis externa    Palpitations    TIA (transient ischemic attack)     Objective:  BP (!) 122/92 (BP Location: Left Arm, Patient Position: Sitting, Cuff Size: Normal)   Pulse 86   Temp 98.5 F (36.9  C) (Temporal)   Wt 199 lb (90.3 kg)   SpO2 97%   BMI 32.61 kg/m  Body mass index is 32.61 kg/m. Physical Exam: GEN: alert, well developed HEENT: clear conjunctiva, nose with mild inferior turbinate hypertrophy, pink nasal mucosa, clear rhinorrhea, + cobblestoning HEART: regular rate and rhythm, no murmur LUNGS: clear to auscultation bilaterally, no coughing, unlabored respiration SKIN: no rashes or lesions  Spirometry:  Tracings reviewed. Her effort: Good reproducible efforts. FVC: 2.26L, 75% predicted  FEV1: 1.99L, 85% predicted FEV1/FVC ratio: 88% Interpretation: Spirometry consistent with normal pattern.  Please see scanned spirometry results for details.  Assessment:   1. Other allergic rhinitis   2. Chest tightness   3. Chronic cough   4. Wheezing   5. Eosinophilic esophagitis     Plan/Recommendations:  Cough/Chest Tightness/Wheezing - Normal spirometry today. Possibly cough variant asthma? Start Qvar  40mcg 2 puffs twice daily.  Brush, rinse mouth and gargle after use.   - Will keep GERD/LPR in differential for chronic cough but that would not explain the chest tightness.   - Discussed use of Flonase /Zyrtec  for PND.  - Unable to do Albuterol  due to SVT.  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   - Initial rxn: facial swelling with cefdinir, unclear  reaction with penicillin.  - Recommend direct amoxicillin challenge.  Would need to hold anti histamines 3 days prior.   - Once we sort the amoxicillin situation, we will discuss Cefdinir challenge.   Urticaria/Angioedema (Hives/Swelling): - At this time etiology of hives and swelling is unknown. Hives can be caused by a variety of different triggers including illness/infection, pressure, vibrations, extremes of temperature to name a few however majority of the time there is no  identifiable trigger.  - Start Zyrtec  10mg  daily. - If hives/swelling recur, increase to Zyrtec  10mg  twice daily.   Other Allergic Rhinitis: - Use nasal saline rinses before nose sprays such as with Neilmed Sinus Rinse.  Use distilled water.   - Use Flonase  1-2 sprays each nostril daily. Aim upward and outward. - Use Zyrtec  10 mg daily.   Eosinophilic Esophagitis? - Will obtain records from [facility] regarding most recent notes, EGD and pathology. EGD in 2008 with lower esophageal ring s/p dilation.  - Agree with restarting PPI and Pepcid  as recommended by GI.   Food Allergy:  - please strictly avoid shellfish, peanut , treenut,  - Initial rxn: throat swelling and hives  - for SKIN only reaction, okay to take Benadryl  25mg  capsules every 6 hours as needed - for SKIN + ANY additional symptoms, OR IF concern for LIFE THREATENING reaction = Epipen  Autoinjector EpiPen  0.3 mg. - If using Epinephrine  autoinjector, call 911 or go to the ER.   Food Intolerance - Diarrhea with eggs.  Continue eating baked eggs.   Oral Allergy Syndrome- Fresh Fruits  - These symptoms are typically not life-threatening and are because of a cross reaction between a pollen you are allergic to, and to a protein in specific foods (such as fresh fruits, vegetables, and nuts). - If you can eat these things and tolerate the symptoms, it is fine to continue to do so.  If not, you may avoid these fresh fruits and vegetables.   - Heating these foods, buying them canned, and peeling these foods should allow them to be consumed without symptoms or with less symptoms. - Patients typically report itching and/or mild swelling of the mouth and throat immediately following ingestion of certain uncooked fruits (including nuts) or raw vegetables.  - Only a very small number of affected individuals experience systemic allergic reactions, such as anaphylaxis which occurs with true food allergies.       Return in about 3 months  (around 03/19/2024).  Arleta Blanch, MD [facility] 

## 2023-12-22 ENCOUNTER — Encounter: Payer: Self-pay | Admitting: Neurology

## 2023-12-22 ENCOUNTER — Ambulatory Visit: Admitting: Neurology

## 2023-12-22 NOTE — Patient Instructions (Addendum)
 Preventative: New class of medications included Ajovy, Emgality, Qulipta, Vyepti Acute management: Nurtec and Ubrelvy  Migraine Headache A migraine headache is an intense pulsing or throbbing pain on one or both sides of the head. Migraine headaches may also cause other symptoms, such as nausea, vomiting, and sensitivity to light and noise. A migraine headache can last from 4 hours to 3 days. Talk with your health care provider about what things may bring on (trigger) your migraine headaches. What are the causes? The exact cause is not known. However, a migraine may be caused when nerves in the brain get irritated and release chemicals that cause blood vessels to become inflamed. This inflammation causes pain. Migraines may be triggered or caused by: Smoking. Medicines, such as: Nitroglycerin , which is used to treat chest pain. Birth control pills. Estrogen. Certain blood pressure medicines. Foods or drinks that contain nitrates, glutamate, aspartame, MSG, or tyramine. Certain foods or drinks, such as aged cheeses, chocolate, alcohol, or caffeine. Doing physical activity that is very hard. Other triggers may include: Menstruation. Pregnancy. Hunger. Stress. Getting too much or too little sleep. Weather changes. Tiredness (fatigue). What increases the risk? The following factors may make you more likely to have migraine headaches: Being between the ages of 6-35 years old. Being female. Having a family history of migraine headaches. Being Caucasian. Having a mental health condition, such as depression or anxiety. Being obese. What are the signs or symptoms? The main symptom of this condition is pulsing or throbbing pain. This pain may: Happen in any area of the head, such as on one or both sides. Make it hard to do daily activities. Get worse with physical activity. Get worse around bright lights, loud noises, or smells. Other symptoms may  include: Nausea. Vomiting. Dizziness. Before a migraine headache starts, you may get warning signs (an aura). An aura may include: Seeing flashing lights or having blind spots. Seeing bright spots, halos, or zigzag lines. Having tunnel vision or blurred vision. Having numbness or a tingling feeling. Having trouble talking. Having muscle weakness. After a migraine ends, you may have symptoms. These may include: Feeling tired. Trouble concentrating. How is this diagnosed? A migraine headache can be diagnosed based on: Your symptoms. A physical exam. Tests, such as: A CT scan or an MRI of the head. These tests can help rule out other causes of headaches. Taking fluid from the spine (lumbar puncture) to examine it (cerebrospinal fluid analysis, or CSF analysis). How is this treated? This condition may be treated with medicines that: Relieve pain and nausea. Prevent migraines. Treatment may also include: Acupuncture. Lifestyle changes like avoiding foods that trigger migraine headaches. Learning ways to control your body (biofeedback). Talk therapy to help you know and deal with negative thoughts (cognitive behavioral therapy). Follow these instructions at home: Medicines Take over-the-counter and prescription medicines only as told by your provider. Ask your provider if the medicine prescribed to you: Requires you to avoid driving or using machinery. Can cause constipation. You may need to take these actions to prevent or treat constipation: Drink enough fluid to keep your pee (urine) pale yellow. Take over-the-counter or prescription medicines. Eat foods that are high in fiber, such as beans, whole grains, and fresh fruits and vegetables. Limit foods that are high in fat and processed sugars, such as fried or sweet foods. Lifestyle  Do not drink alcohol. Do not use any products that contain nicotine or tobacco. These products include cigarettes, chewing tobacco, and vaping  devices, such as e-cigarettes.  If you need help quitting, ask your provider. Get 7-9 hours of sleep each night, or the amount recommended by your provider. Find ways to manage stress, such as meditation, deep breathing, or yoga. Try to exercise regularly. This can help lessen how bad and how often your migraines occur. General instructions Keep a journal to find out what triggers your migraines, so you can avoid those things. For example, write down: What you eat and drink. How much sleep you get. Any change to your diet or medicines. If you have a migraine headache: Avoid things that make your symptoms worse, such as bright lights. Lie down in a dark, quiet room. Do not drive or use machinery. Ask your provider what activities are safe for you while you have symptoms. Keep all follow-up visits. Your provider will monitor your symptoms and recommend any further treatment. Where to find more information Coalition for Headache and Migraine Patients (CHAMP): headachemigraine.org American Migraine Foundation: americanmigrainefoundation.org National Headache Foundation: headaches.org Contact a health care provider if: You have symptoms that are different or worse than your usual migraine headache symptoms. You have more than 15 days of headaches in one month. Get help right away if: Your migraine headache becomes severe or lasts more than 72 hours. You have a fever or stiff neck. You have vision loss. Your muscles feel weak or like you cannot control them. You lose your balance often or have trouble walking. You faint. You have a seizure. This information is not intended to replace advice given to you by your health care provider. Make sure you discuss any questions you have with your health care provider. Document Revised: 11/22/2021 Document Reviewed: 11/22/2021 Elsevier Patient Education  2024 [employer].

## 2023-12-22 NOTE — Progress Notes (Unsigned)
 [facility]    Provider:  Dr Ines Requesting Provider: Claudene Lacks, MD Primary Care Provider:  Cleotilde Planas, MD  CC:  ***  HPI:  Heather Carey is a 73 y.o. female here as requested by Claudene Lacks, MD for headaches. has Chest pain; Hypertension; Hyperlipidemia; MVP (mitral valve prolapse); SOB (shortness of breath); Snoring; Mitral regurgitation; Allergic reaction; Food allergy; Urticaria with associated angioedema; Angioedema; Syncope; Sensory disturbance; Hypothyroidism; Malnutrition of moderate degree; SVT (supraventricular tachycardia) (HCC); and Cryptogenic stroke (HCC) on their problem list. In 2012 had right-side acute weakness and numbness/TIA.   She ahs had migraines forever. She had worsening headaches enf of June and July. Then improved in August. For her migraines she get aura flashing lights and in the past was due to baromeric pressure but recent headaches different. Perfumes can trigger. Nausea, room spiinning, pulsating/pounding/throbbing as part of long-standing routine migraines, disagnosed in the 0th grade, mother ha severe headaches and all her children, lots of allergies in the family may be contributory, the ones this summer were different may have been affected by the barometric pressure, on average she has 15 headache days a month and most are moderate to severe 8 migrainous a month. Can be Extreme/severe monthly, stress or travelling can trigger.   Reviewed notes, labs and imaging from outside physicians, which showed ***  Tried and Failed: From a thorough review of records and patient report, Medications tried that can be used in migraine/headache management greater than 3 months include: Lifestyle modification, headache diaries, better sleep hygiene, exercise, management of migraine triggers, OTC and prescribed analgesics/nsaids such as ibuprofen, excedrin, alleve and others, metoprolol , zofran , compazine , triptans contraindicated due to stroke,  topiramate, amitriptyline/nortriptyline, paxil with side effects significantly  MRI brain 10/20/2023: CLINICAL DATA:  Provided history: Aching headache. Additional history provided by the scanning technologist: The patient reports headaches and neck pain following a bug bite 4-6 weeks ago.   EXAM: MRI HEAD WITHOUT CONTRAST   TECHNIQUE: Multiplanar, multiecho pulse sequences of the brain and surrounding structures were obtained without intravenous contrast.   COMPARISON:  Head CT 04/21/2022.  Brain MRI 07/30/2021.   FINDINGS: Brain:   No age-advanced or lobar predominant cerebral atrophy.   Multifocal T2 FLAIR hyperintense signal abnormality within the cerebral white matter, nonspecific but compatible with mild chronic small vessel ischemic disease.   Partially empty sella turcica, unchanged.   No cortical encephalomalacia is identified.   There is no acute infarct.   No evidence of an intracranial mass.   No chronic intracranial blood products.   No extra-axial fluid collection.   No midline shift.   Vascular: Maintained flow voids within the proximal large arterial vessels.   Skull and upper cervical spine: No focal worrisome marrow lesion.   Sinuses/Orbits: No mass or acute finding within the imaged orbits. No significant paranasal sinus disease.   IMPRESSION: 1. No evidence of an acute intracranial abnormality. 2. Mild chronic small vessel ischemic changes within the cerebral white matter, similar to the prior MRI of 07/30/2021.  Review of Systems: Patient complains of symptoms per HPI as well as the following symptoms ***. Pertinent negatives and positives per HPI. All others negative.   Social History   Socioeconomic History   Marital status: Married    Spouse name: Not on file   Number of children: Not on file   Years of education: Not on file   Highest education level: Not on file  Occupational History   Not on file  Tobacco Use  Smoking  status: Never    Passive exposure: Never   Smokeless tobacco: Never  Vaping Use   Vaping status: Never Used  Substance and Sexual Activity   Alcohol use: Not Currently    Alcohol/week: 1.0 standard drink of alcohol    Types: 1 Glasses of wine per week   Drug use: No   Sexual activity: Yes  Other Topics Concern   Not on file  Social History Narrative   Not on file   Social Drivers of Health   Financial Resource Strain: Not on file  Food Insecurity: Not on file  Transportation Needs: Not on file  Physical Activity: Not on file  Stress: Not on file  Social Connections: Not on file  Intimate Partner Violence: Not on file    Family History  Problem Relation Age of Onset   Allergic rhinitis Mother    Urticaria Mother    Allergic rhinitis Father    Asthma Father    Allergic rhinitis Sister    Angioedema Sister    Urticaria Sister    Allergic rhinitis Brother    Asthma Brother     Past Medical History:  Diagnosis Date   Diverticulitis    Eosinophilic esophagitis    Fibromyalgia    GERD (gastroesophageal reflux disease)    Hyperlipidemia    Hypothyroidism    IBS (irritable bowel syndrome)    Migraines    Mitral regurgitation 02/28/2014   Mild to moderate by echo 02/2014   Obesity    Osteoarthritis    Otitis externa    Palpitations    TIA (transient ischemic attack)     Patient Active Problem List   Diagnosis Date Noted   SVT (supraventricular tachycardia) (HCC) 02/18/2021   Cryptogenic stroke (HCC) 02/18/2021   Malnutrition of moderate degree 03/09/2016   Syncope 03/08/2016   Sensory disturbance 03/08/2016   Hypothyroidism 03/08/2016   Urticaria with associated angioedema 04/27/2015   Angioedema 04/27/2015   Allergic reaction 01/07/2015   Food allergy 01/07/2015   Mitral regurgitation 02/28/2014   MVP (mitral valve prolapse) 02/24/2014   SOB (shortness of breath) 02/24/2014   Snoring 02/24/2014   Hypertension    Hyperlipidemia    Chest pain  02/10/2014    Past Surgical History:  Procedure Laterality Date   ABDOMINAL HYSTERECTOMY     BREAST BIOPSY Left 11/02/2022   MM LT BREAST BX W LOC DEV 1ST LESION IMAGE BX SPEC STEREO GUIDE 11/02/2022 [facility]   carpel tunnel     lbp gso     LOOP RECORDER INSERTION N/A 05/08/2017   Procedure: LOOP RECORDER INSERTION;  Surgeon: Waddell Danelle ORN, MD;  Location: [facility];  Service: Cardiovascular;  Laterality: N/A;   TONSILLECTOMY AND ADENOIDECTOMY      Current Outpatient Medications  Medication Sig Dispense Refill   acetaminophen  (TYLENOL ) 500 MG tablet Take 1,000 mg by mouth 2 (two) times daily as needed (migraine headaches).     beclomethasone (QVAR  REDIHALER) 40 MCG/ACT inhaler Inhale 2 puffs into the lungs 2 (two) times daily. 1 each 1   cetirizine  (ZYRTEC  ALLERGY) 10 MG tablet Take 1 tablet (10 mg total) by mouth 2 (two) times daily as needed for allergies (or hives). (Patient not taking: Reported on 12/19/2023) 60 tablet 5   diphenhydrAMINE  (BENADRYL ) 12.5 MG chewable tablet Chew 25 mg by mouth 2 (two) times daily as needed (allergic reaction).     EPINEPHrine  0.3 mg/0.3 mL IJ SOAJ injection Inject 0.3 mg into the muscle as needed  for anaphylaxis. 2 each 1   fluticasone  (FLONASE ) 50 MCG/ACT nasal spray Place 2 sprays into both nostrils daily. (Patient not taking: Reported on 12/19/2023) 16 g 5   hydrocortisone  cream 0.5 % Apply 1 application. topically 2 (two) times daily as needed for itching (bug bites).     levothyroxine  (SYNTHROID ) 88 MCG tablet Take 88 mcg by mouth daily before breakfast.     metoprolol  succinate (TOPROL -XL) 25 MG 24 hr tablet TAKE 1/2 TABLET (12.5 MG TOTAL) BY MOUTH 2 (TWO) TIMES DAILY. 90 tablet 2   nitroGLYCERIN  (NITROSTAT ) 0.4 MG SL tablet Place 1 tablet (0.4 mg total) under the tongue every 5 (five) minutes as needed for chest pain. (Patient not taking: Reported on 12/19/2023) 25 tablet 3   triamcinolone cream (KENALOG) 0.1 % Apply 1 Application  topically as needed.     No current facility-administered medications for this visit.    Allergies as of 12/22/2023 - Review Complete 12/19/2023  Allergen Reaction Noted   Azithromycin Anaphylaxis 01/07/2015   Codeine Shortness Of Breath 10/15/2013   Ivp dye [iodinated contrast media] Anaphylaxis 02/16/2011   Natamycin Other (See Comments) 06/06/2016   Other Anaphylaxis, Hives, Swelling, and Other (See Comments) 02/16/2011   Rose Hives 02/11/2014   Shellfish allergy Anaphylaxis 02/16/2011   Thimerosol [thimerosal (thiomersal)] Anaphylaxis 10/15/2013   Adhesive [tape] Hives 06/16/2016   Avelox [moxifloxacin hcl in nacl] Other (See Comments) 10/15/2013   Bacitracin Hives and Itching 02/16/2011   Bacitracin-polymyxin b  10/07/2021   Definity  [perflutren  lipid microsphere] Other (See Comments) 07/30/2021   Doxycycline Hives and Swelling 03/30/2020   Ginger Hives and Swelling 02/10/2014   Neomycin Hives 06/06/2016   Onion Swelling 12/07/2014   Raspberry Itching and Swelling 07/13/2021   Sunscreens  10/07/2021   Sunscreens [aminobenzoate] Hives and Swelling 06/06/2016   Watermelon [citrullus vulgaris]  12/07/2014   Benzalkonium chloride Rash 11/02/2011   Cephalosporins Hives and Rash 10/15/2013   Penicillins Other (See Comments) 02/16/2011    Vitals: There were no vitals taken for this visit. Last Weight:  Wt Readings from Last 1 Encounters:  12/19/23 199 lb (90.3 kg)   Last Height:   Ht Readings from Last 1 Encounters:  08/02/23 5' 5.5 (1.664 m)     Physical exam: Exam: Gen: NAD, conversant, well nourised, obese, well groomed                     CV: RRR, no MRG. No Carotid Bruits. No peripheral edema, warm, nontender Eyes: Conjunctivae clear without exudates or hemorrhage  Neuro: Detailed Neurologic Exam  Speech:    Speech is normal; fluent and spontaneous with normal comprehension.  Cognition:    The patient is oriented to person, place, and time;     recent and  remote memory intact;     language fluent;     normal attention, concentration,     fund of knowledge Cranial Nerves:    The pupils are equal, round, and reactive to light. The fundi are normal and spontaneous venous pulsations are present. Visual fields are full to finger confrontation. Extraocular movements are intact. Trigeminal sensation is intact and the muscles of mastication are normal. The face is symmetric. The palate elevates in the midline. Hearing intact. Voice is normal. Shoulder shrug is normal. The tongue has normal motion without fasciculations.   Coordination:    Normal finger to nose and heel to shin. Normal rapid alternating movements.   Gait:    Heel-toe and tandem gait are normal.  Motor Observation:    No asymmetry, no atrophy, and no involuntary movements noted. Tone:    Normal muscle tone.    Posture:    Posture is normal. normal erect    Strength:    Strength is V/V in the upper and lower limbs.      Sensation: intact to LT     Reflex Exam:  DTR's:    Deep tendon reflexes in the upper and lower extremities are normal bilaterally.   Toes:    The toes are downgoing bilaterally.   Clonus:    Clonus is absent.    Assessment/Plan:    No orders of the defined types were placed in this encounter.  No orders of the defined types were placed in this encounter.   Cc: Claudene Lacks, MD,  Cleotilde Planas, MD  Onetha Epp, MD  [facility] [address] [place], [place] [address]  Phone [phone] Fax [phone]

## 2023-12-24 DIAGNOSIS — R051 Acute cough: Secondary | ICD-10-CM | POA: Diagnosis not present

## 2023-12-24 DIAGNOSIS — J988 Other specified respiratory disorders: Secondary | ICD-10-CM | POA: Diagnosis not present

## 2023-12-24 DIAGNOSIS — J029 Acute pharyngitis, unspecified: Secondary | ICD-10-CM | POA: Diagnosis not present

## 2023-12-24 DIAGNOSIS — Z03818 Encounter for observation for suspected exposure to other biological agents ruled out: Secondary | ICD-10-CM | POA: Diagnosis not present

## 2023-12-29 ENCOUNTER — Other Ambulatory Visit: Payer: Self-pay | Admitting: Internal Medicine

## 2023-12-29 MED ORDER — AMOXICILLIN 250 MG/5ML PO SUSR: 500.0000 mg | Freq: Every day | ORAL | 0 refills | Status: AC

## 2023-12-29 NOTE — Progress Notes (Signed)
 Amoxil  sent for challenge.

## 2024-01-02 ENCOUNTER — Ambulatory Visit (INDEPENDENT_AMBULATORY_CARE_PROVIDER_SITE_OTHER): Admitting: Internal Medicine

## 2024-01-02 ENCOUNTER — Encounter: Payer: Self-pay | Admitting: Internal Medicine

## 2024-01-02 VITALS — BP 120/84 | HR 81 | Temp 97.8°F | Resp 16

## 2024-01-02 DIAGNOSIS — L278 Dermatitis due to other substances taken internally: Secondary | ICD-10-CM | POA: Diagnosis not present

## 2024-01-02 DIAGNOSIS — Z880 Allergy status to penicillin: Secondary | ICD-10-CM

## 2024-01-02 NOTE — Progress Notes (Signed)
   FOLLOW UP Date of Service/Encounter:  01/02/24   Subjective:  Heather Carey (DOB: 07/25/1950) is a 73 y.o. female who [facility] for follow up for amoxicillin  challenge.   History obtained from: chart review and patient.   Can't recall reaction history with penicillin.  Chart states nonspecific rash.  Discussed low risk, plan for amoxicillin  challenge at initial visit.  She is here today for the challenge.   Past Medical History: Past Medical History:  Diagnosis Date   Diverticulitis    Eosinophilic esophagitis    Fibromyalgia    GERD (gastroesophageal reflux disease)    Hyperlipidemia    Hypothyroidism    IBS (irritable bowel syndrome)    Migraines    Mitral regurgitation 02/28/2014   Mild to moderate by echo 02/2014   Obesity    Osteoarthritis    Otitis externa    Palpitations    TIA (transient ischemic attack)     Objective:  BP 128/84 (BP Location: Left Arm, Patient Position: Sitting, Cuff Size: Large)   Pulse 88   Temp 98.2 F (36.8 C) (Temporal)   Resp 16   SpO2 99%  There is no height or weight on file to calculate BMI. Physical Exam: GEN: alert, well developed HEENT: clear conjunctiva, nose without rhinorrhea.  HEART: regular rate and rhythm, no murmur LUNGS: clear to auscultation bilaterally, no coughing, unlabored respiration SKIN: no rashes or lesions   About 20 minutes after the first dose, reported feeling cold, dizzy and shaky. She notes a history of POTS.  No other symptoms of dyspnea/wheezing/hives/itching/vomiting/diarrhea/nausea.  Discussed I do not think she is having an allergic reaction but wonder if she is anxious/recurrence of POTS.  We will stop the challenge and observe for reaction.  If she requires this antibiotic (penicillin) in the future, can consider referral to an academic center for challenge or desensitization.    Observed for 2 hours after the dose without any worsening or new symptoms.   Still reports feeling cold.  Vitals stable.    Post Physical Exam: Vitals: 120/84, HR 81, Temp 97.8, SaO2 98%  GEN: alert, well developed HEENT: clear conjunctiva, nose without rhinorrhea.  HEART: regular rate and rhythm, no murmur LUNGS: clear to auscultation bilaterally, no coughing, unlabored respiration SKIN: no rashes or lesions   Assessment:   1. Allergy status to penicillin   2. Dermatitis due to other substances taken internally     Plan/Recommendations:   Failed Amoxicillin  Challenge today.  Continue avoidance of penicillin class of medications.   If you develop any symptoms concerning for anaphylaxis such as  Hives/swelling and trouble breathing/wheezing/nausea/vomiting/dizziness/faint/pale/trouble swallowing, must go to the ER for evaluation.     Arleta Blanch, MD [facility]

## 2024-01-02 NOTE — Patient Instructions (Addendum)
 Failed Amoxicillin  Challenge today.  Continue avoidance of penicillin class of medications.   If you develop any symptoms concerning for anaphylaxis such as  Hives/swelling with trouble breathing/wheezing/nausea/vomiting/dizziness/faint/pale/trouble swallowing, must go to the ER for evaluation.

## 2024-01-03 ENCOUNTER — Ambulatory Visit

## 2024-01-03 VITALS — BP 118/66 | HR 90 | Ht 65.5 in | Wt 199.0 lb

## 2024-01-03 DIAGNOSIS — R053 Chronic cough: Secondary | ICD-10-CM | POA: Diagnosis not present

## 2024-01-03 DIAGNOSIS — J4540 Moderate persistent asthma, uncomplicated: Secondary | ICD-10-CM | POA: Diagnosis not present

## 2024-01-03 DIAGNOSIS — J984 Other disorders of lung: Secondary | ICD-10-CM | POA: Diagnosis not present

## 2024-01-03 DIAGNOSIS — Z9109 Other allergy status, other than to drugs and biological substances: Secondary | ICD-10-CM | POA: Insufficient documentation

## 2024-01-03 DIAGNOSIS — J309 Allergic rhinitis, unspecified: Secondary | ICD-10-CM

## 2024-01-03 MED ORDER — QVAR REDIHALER 40 MCG/ACT IN AERB: 2.0000 | INHALATION_SPRAY | Freq: Two times a day (BID) | RESPIRATORY_TRACT | 5 refills | Status: AC

## 2024-01-03 NOTE — Assessment & Plan Note (Addendum)
 Likely chronic. Repeat cxray in few weeks If worsening or new areas of scarring or symptoms worsen, may need to consider CT chest. Orders:   DG Chest 2 View; Future   Pulmonary function test; Future

## 2024-01-03 NOTE — Assessment & Plan Note (Addendum)
 Ongoing for over 8 weeks now. Xray didn't show ILD. Symptoms concerning for RADS/asthma likely triggered by viral infection;also likely has allergic rhinitis/PND Given chronic allergy history, agree with allergic work up Reviewed outside spirometry as above Will schedule full PFT with BD resting prior to follow up Given atleast moderate symptoms, advised her to continue qvar  and to rinse mouth after use. Advised her to start taking cetrizine daily No clinical evidence of CHF. Non smoker. Low concern for cancer- will repeat C-xray in 6-8 weeks since last one Orders:   DG Chest 2 View; Future   Pulmonary function test; Future   beclomethasone (QVAR  REDIHALER) 40 MCG/ACT inhaler; Inhale 2 puffs into the lungs 2 (two) times daily.

## 2024-01-03 NOTE — Assessment & Plan Note (Addendum)
 Start taking cetrizine or allegra or xyzal Advised her to take saline nasal spray if she can't take flonase 

## 2024-01-03 NOTE — Assessment & Plan Note (Addendum)
 Discussed in detail about the pathophysiology, symptoms, treatment of asthma and reactive airways disease Continue qvar  93mcg/act inhaler 2 puff BID daily Pt likely can't tolerate LABA/LAMA/SABA due to symptomatic SVT/POTS hx.  Pt wondering if she needs to take qvar  daily. Advised her to continue regular use for now. In the future we can attempt to replace qvar  with airsupra if her symptoms remains well controlled (mild intermittent asthma ) and if she can tolerate it.  Orders:   DG Chest 2 View; Future   Pulmonary function test; Future   beclomethasone (QVAR  REDIHALER) 40 MCG/ACT inhaler; Inhale 2 puffs into the lungs 2 (two) times daily.

## 2024-01-03 NOTE — Patient Instructions (Signed)
 Take Qvar  twice a day Please Rinse mouth/brush teeth after using inhalers  Complete c-xray in 3-4 weeks Pulmonary function test prior to next visit in about 3 months

## 2024-01-03 NOTE — Progress Notes (Signed)
 Subjective:   PATIENT ID: Heather Carey GENDER: female DOB: 09-29-1950, MRN: [id number]   HPI Heather Carey is a 73 Y O Female who is referred here by Dr Luwana Sor for chronic cough ongoing since July 2025.  Heather Carey has a hx of several environmental allergies and has strong family history of allergies and asthma. Heather Carey reports 3 episodes of COVID bronchitis in the last 3 years, one of which was severe enough but didn't require inpatient admission at that time.  Heather Carey reports a bronchitis episode in July. Heather Carey tested negative for virus. Heather Carey has several allergies hence didn't take antibiotics initially. Due to severity of symptoms including wheezing, chest tightness, Heather Carey was prescribed prednisone  but Heather Carey developed A/E to it and couldn't complete the course. Also developed allergic reaction to doxycycline. Chest xray ordered at that time showed left lower lobe scarring. Heather Carey prior CT chest from few years ago mentions  Linear consolidation within the lingula and right lower lobe most consistent with subsegmental atelectasis, I was unable to review the images myself.  Heather Carey recently saw allergist and underwent PFT which was within normal limits. However Heather Carey FVC was on low normal range. Heather Carey was started on qvar  and cetrizine. Heather Carey reports significant benefit with qvar . Doesn't take cetrizine regularly. Also can't take flonase  due to one ingredient Heather Carey is expecting full panel allergy testing in coming months  Heather Carey reports shortness of breath - on/off daily, worse with heat, exercise and illness  Heather Carey also has hx of SVT, POTS/syncope related to it. Take metoprolol  long term Also has hx of TIA, OSA - couldn't tolerate CPAP machine. 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 Lives with Heather Carey husband Pt is a non smoker  Heather Carey mother who  Review of system is positive for shortness of breath on activity and rest, non productive cough, difficulty swallowing, headaches, joint  stiffness    Past Medical History:  Diagnosis Date   Diverticulitis    Eosinophilic esophagitis    Fibromyalgia    GERD (gastroesophageal reflux disease)    Hyperlipidemia    Hypothyroidism    IBS (irritable bowel syndrome)    Migraines    Mitral regurgitation 02/28/2014   Mild to moderate by echo 02/2014   Obesity    Osteoarthritis    Otitis externa    Palpitations    TIA (transient ischemic attack)      Family History  Problem Relation Age of Onset   Allergic rhinitis Mother    Urticaria Mother    Allergic rhinitis Father    Asthma Father    Allergic rhinitis Sister    Angioedema Sister    Urticaria Sister    Allergic rhinitis Brother    Asthma Brother      Social History   Socioeconomic History   Marital status: Married    Spouse name: Not on file   Number of children: Not on file   Years of education: Not on file   Highest education level: Not on file  Occupational History   Not on file  Tobacco Use   Smoking status: Never    Passive exposure: Past   Smokeless tobacco: Never  Vaping Use   Vaping status: Never Used  Substance and Sexual Activity   Alcohol use: Not Currently    Alcohol/week: 1.0 standard drink of alcohol    Types: 1 Glasses of wine per week   Drug use: No   Sexual activity: Yes  Other  Topics Concern   Not on file  Social History Narrative   Not on file   Social Drivers of Health   Financial Resource Strain: Not on file  Food Insecurity: Not on file  Transportation Needs: Not on file  Physical Activity: Not on file  Stress: Not on file  Social Connections: Not on file  Intimate Partner Violence: Not on file     Allergies  Allergen Reactions   Azithromycin Anaphylaxis   Codeine Shortness Of Breath   Ivp Dye [Iodinated Contrast Media] Anaphylaxis   Natamycin Other (See Comments)    Throat Swelling   Other Anaphylaxis, Hives, Swelling and Other (See Comments)    Irregular heart beat Triple Antibiotic Ointment Nuts -  Anaphylaxis  Berries - itching and swelling    Rose Hives   Shellfish Allergy Anaphylaxis   Thimerosol [Thimerosal (Thiomersal)] Anaphylaxis   Adhesive [Tape] Hives    Paper tape is ok   Avelox [Moxifloxacin Hcl In Nacl] Other (See Comments)    Irregular heart beat   Bacitracin Hives and Itching   Bacitracin-Polymyxin B     Other reaction(s): hives   Definity  [Perflutren  Lipid Microsphere] Other (See Comments)    Shakiness, numbness in extremities, headache, SVT, hallucinations    Doxycycline Hives and Swelling    Facial swelling   Ginger Hives and Swelling   Neomycin Hives   Onion Swelling    MOUTH SWELLING   Raspberry Itching and Swelling   Sunscreens     Other reaction(s): HIVES/THROAT SWELLING   Sunscreens [Aminobenzoate] Hives and Swelling    Avobenzene and Oxybenzene   Watermelon [Citrullus Vulgaris]     Tongue swelling    Benzalkonium Chloride Rash   Cephalosporins Hives and Rash   Penicillins Other (See Comments)    Childhood allergy Has patient had a PCN reaction causing immediate rash, facial/tongue/throat swelling, SOB or lightheadedness with hypotension: Unknown Has patient had a PCN reaction causing severe rash involving mucus membranes or skin necrosis: Unknown Has patient had a PCN reaction that required hospitalization: No Has patient had a PCN reaction occurring within the last 10 years: No If all of the above answers are NO, then may proceed with Cephalosporin use.      Outpatient Medications Prior to Visit  Medication Sig Dispense Refill   acetaminophen  (TYLENOL ) 500 MG tablet Take 1,000 mg by mouth 2 (two) times daily as needed (migraine headaches).     cetirizine  (ZYRTEC  ALLERGY) 10 MG tablet Take 1 tablet (10 mg total) by mouth 2 (two) times daily as needed for allergies (or hives). 60 tablet 5   diphenhydrAMINE  (BENADRYL ) 12.5 MG chewable tablet Chew 25 mg by mouth 2 (two) times daily as needed (allergic reaction).     EPINEPHrine  0.3 mg/0.3 mL IJ  SOAJ injection Inject 0.3 mg into the muscle as needed for anaphylaxis. 2 each 1   hydrocortisone  cream 0.5 % Apply 1 application. topically 2 (two) times daily as needed for itching (bug bites).     levothyroxine  (SYNTHROID ) 88 MCG tablet Take 88 mcg by mouth daily before breakfast.     metoprolol  succinate (TOPROL -XL) 25 MG 24 hr tablet TAKE 1/2 TABLET (12.5 MG TOTAL) BY MOUTH 2 (TWO) TIMES DAILY. 90 tablet 2   nitroGLYCERIN  (NITROSTAT ) 0.4 MG SL tablet Place 1 tablet (0.4 mg total) under the tongue every 5 (five) minutes as needed for chest pain. 25 tablet 3   pantoprazole  (PROTONIX ) 40 MG tablet Take 40 mg by mouth 2 (two) times daily.  triamcinolone cream (KENALOG) 0.1 % Apply 1 Application topically as needed.     beclomethasone (QVAR  REDIHALER) 40 MCG/ACT inhaler Inhale 2 puffs into the lungs 2 (two) times daily. 1 each 1   fluticasone  (FLONASE ) 50 MCG/ACT nasal spray Place 2 sprays into both nostrils daily. (Patient not taking: Reported on 01/02/2024) 16 g 5   No facility-administered medications prior to visit.    ROS Reviewed all systems and reported negative except as above     Objective:   Vitals:   01/03/24 0939  BP: 118/66  Pulse: 90  SpO2: 96%  Weight: 199 lb (90.3 kg)  Height: 5' 5.5 (1.664 m)    Physical Exam Constitutional:      General: Heather Carey is not in acute distress.    Appearance: Normal appearance.  HENT:     Mouth/Throat:     Mouth: Mucous membranes are moist.     Pharynx: Oropharynx is clear.  Cardiovascular:     Rate and Rhythm: Normal rate.  Pulmonary:     Breath sounds: Normal breath sounds.  Musculoskeletal:     Right lower leg: No edema.     Left lower leg: No edema.  Skin:    General: Skin is warm.  Neurological:     Mental Status: Heather Carey is oriented to person, place, and time.  Psychiatric:        Mood and Affect: Mood normal.        Behavior: Behavior normal.        CBC    Component Value Date/Time   WBC 9.4 03/05/2023 1656    RBC 4.07 03/05/2023 1656   HGB 12.6 03/05/2023 1656   HGB 12.8 07/13/2021 1157   HCT 37.2 03/05/2023 1656   HCT 37.9 07/13/2021 1157   PLT 215 03/05/2023 1656   PLT 207 07/13/2021 1157   MCV 91.4 03/05/2023 1656   MCV 92 07/13/2021 1157   MCH 31.0 03/05/2023 1656   MCHC 33.9 03/05/2023 1656   RDW 13.3 03/05/2023 1656   RDW 12.7 07/13/2021 1157   LYMPHSABS 1.6 07/30/2021 1200   MONOABS 0.4 07/30/2021 1200   EOSABS 0.2 07/30/2021 1200   BASOSABS 0.1 07/30/2021 1200     Chest imaging:  PFT:     No data to display         OUTSIDE SPIROMETRY FROM outside review 12/19/2023 FEV1/FVC- 0.88 FEV1 1.99L, 86.15% FVC 2.26 L, 75.33%  Xray 11/2023 reviewed Thoracic spondylosis, liner scarring in left lung base  CT chest reports 09/2020  Linear consolidation within the lingula and right lower lobe most consistent with subsegmental atelectasis.     Assessment & Plan:   Assessment & Plan Chronic cough Ongoing for over 8 weeks now. Xray didn't show ILD. Symptoms concerning for RADS/asthma likely triggered by viral infection;also likely has allergic rhinitis/PND Given chronic allergy history, agree with allergic work up Reviewed outside spirometry as above Will schedule full PFT with BD resting prior to follow up Given atleast moderate symptoms, advised Heather Carey to continue qvar  and to rinse mouth after use. Advised Heather Carey to start taking cetrizine daily No clinical evidence of CHF. Non smoker. Low concern for cancer- will repeat C-xray in 6-8 weeks since last one Orders:   DG Chest 2 View; Future   Pulmonary function test; Future   beclomethasone (QVAR  REDIHALER) 40 MCG/ACT inhaler; Inhale 2 puffs into the lungs 2 (two) times daily.  Moderate persistent asthma without complication Discussed in detail about the pathophysiology, symptoms, treatment of asthma and reactive airways  disease Continue qvar  23mcg/act inhaler 2 puff BID daily Pt likely can't tolerate LABA/LAMA/SABA due to  symptomatic SVT/POTS hx.  Pt wondering if Heather Carey needs to take qvar  daily. Advised Heather Carey to continue regular use for now. In the future we can attempt to replace qvar  with airsupra if Heather Carey symptoms remains well controlled (mild intermittent asthma ) and if Heather Carey can tolerate it.  Orders:   DG Chest 2 View; Future   Pulmonary function test; Future   beclomethasone (QVAR  REDIHALER) 40 MCG/ACT inhaler; Inhale 2 puffs into the lungs 2 (two) times daily.  Allergic rhinitis, unspecified seasonality, unspecified trigger Start taking cetrizine or allegra or xyzal Advised Heather Carey to take saline nasal spray if Heather Carey can't take flonase     Environmental allergies Established with Allergy clinic    Scarring of lung Likely chronic. Repeat cxray in few weeks If worsening or new areas of scarring or symptoms worsen, may need to consider CT chest. Orders:   DG Chest 2 View; Future   Pulmonary function test; Future    Thank you for the opportunity to take part in the care of Heather Carey  I personally spent a total of 45 minutes in the care of the patient today including getting/reviewing separately obtained history, performing a medically appropriate exam/evaluation, counseling and educating, placing orders, documenting clinical information in the EHR, and independently interpreting results.   Hason Ofarrell Pleas, MD [facility] Office: [phone]

## 2024-01-03 NOTE — Assessment & Plan Note (Addendum)
 Established with Allergy clinic

## 2024-01-08 ENCOUNTER — Other Ambulatory Visit: Payer: Self-pay | Admitting: Internal Medicine

## 2024-01-08 MED ORDER — METOPROLOL SUCCINATE ER 25 MG PO TB24: ORAL_TABLET | ORAL | 2 refills | Status: AC

## 2024-01-09 DIAGNOSIS — E7800 Pure hypercholesterolemia, unspecified: Secondary | ICD-10-CM | POA: Diagnosis not present

## 2024-01-09 DIAGNOSIS — E039 Hypothyroidism, unspecified: Secondary | ICD-10-CM | POA: Diagnosis not present

## 2024-01-12 ENCOUNTER — Telehealth: Payer: Self-pay | Admitting: Internal Medicine

## 2024-01-12 NOTE — Telephone Encounter (Signed)
 Patient wanted to schedule skin testing (environmental/food 1-68)   Patient has been scheduled.

## 2024-01-12 NOTE — Telephone Encounter (Signed)
 Pt request a call back about allergy testing.

## 2024-01-15 DIAGNOSIS — J4540 Moderate persistent asthma, uncomplicated: Secondary | ICD-10-CM | POA: Diagnosis not present

## 2024-01-15 DIAGNOSIS — Z23 Encounter for immunization: Secondary | ICD-10-CM | POA: Diagnosis not present

## 2024-01-15 DIAGNOSIS — K219 Gastro-esophageal reflux disease without esophagitis: Secondary | ICD-10-CM | POA: Diagnosis not present

## 2024-01-15 DIAGNOSIS — E559 Vitamin D deficiency, unspecified: Secondary | ICD-10-CM | POA: Diagnosis not present

## 2024-01-15 DIAGNOSIS — I471 Supraventricular tachycardia, unspecified: Secondary | ICD-10-CM | POA: Diagnosis not present

## 2024-01-15 DIAGNOSIS — I10 Essential (primary) hypertension: Secondary | ICD-10-CM | POA: Diagnosis not present

## 2024-01-15 DIAGNOSIS — E039 Hypothyroidism, unspecified: Secondary | ICD-10-CM | POA: Diagnosis not present

## 2024-01-15 DIAGNOSIS — Z0000 Encounter for general adult medical examination without abnormal findings: Secondary | ICD-10-CM | POA: Diagnosis not present

## 2024-01-15 DIAGNOSIS — I700 Atherosclerosis of aorta: Secondary | ICD-10-CM | POA: Diagnosis not present

## 2024-01-15 DIAGNOSIS — Z8673 Personal history of transient ischemic attack (TIA), and cerebral infarction without residual deficits: Secondary | ICD-10-CM | POA: Diagnosis not present

## 2024-01-15 DIAGNOSIS — E538 Deficiency of other specified B group vitamins: Secondary | ICD-10-CM | POA: Diagnosis not present

## 2024-01-31 ENCOUNTER — Ambulatory Visit (INDEPENDENT_AMBULATORY_CARE_PROVIDER_SITE_OTHER)

## 2024-01-31 DIAGNOSIS — J984 Other disorders of lung: Secondary | ICD-10-CM | POA: Diagnosis not present

## 2024-01-31 DIAGNOSIS — R053 Chronic cough: Secondary | ICD-10-CM

## 2024-01-31 DIAGNOSIS — J4540 Moderate persistent asthma, uncomplicated: Secondary | ICD-10-CM

## 2024-02-13 ENCOUNTER — Ambulatory Visit: Payer: Self-pay

## 2024-02-14 ENCOUNTER — Ambulatory Visit: Admitting: Internal Medicine

## 2024-02-14 DIAGNOSIS — L500 Allergic urticaria: Secondary | ICD-10-CM | POA: Diagnosis not present

## 2024-02-14 DIAGNOSIS — J3089 Other allergic rhinitis: Secondary | ICD-10-CM | POA: Diagnosis not present

## 2024-02-14 NOTE — Patient Instructions (Addendum)
 Chronic cough/Chest Tightness - Follow up with Pulm.  -Continue Qvar  40mcg 2 puffs twice daily.  Brush, rinse mouth and gargle after use.    Penicillin Allergy Cefdinir Allergy  - Continue avoidance.   Urticaria/Angioedema (Hives/Swelling): - SPT 02/2024: negative for foods and aeroallergens  - At this time etiology of hives and swelling is unknown. Hives can be caused by a variety of different triggers including illness/infection, pressure, vibrations, extremes of temperature to name a few however majority of the time there is no identifiable trigger.  - Use Zyrtec  10mg  daily. - If hives/swelling recur, increase to Zyrtec  10mg  twice daily.   Chronic Rhinitis: - SPT 02/2024: negative  - Use nasal saline rinses before nose sprays such as with Neilmed Sinus Rinse.  Use distilled water.   - Use Flonase  1-2 sprays each nostril daily. Aim upward and outward. - Use Zyrtec  10 mg daily.   Eosinophilic Esophagitis - Continue follow up with GI.    Food Allergy:  - please strictly avoid shellfish, peanut , treenut,  - SPT 02/2024: negative for shellfish/peanut /treenut.  - for SKIN only reaction, okay to take Benadryl  25mg  capsules every 6 hours as needed - for SKIN + ANY additional symptoms, OR IF concern for LIFE THREATENING reaction = Epipen  Autoinjector EpiPen  0.3 mg. - If using Epinephrine  autoinjector, call 911 or go to the ER.   Food Intolerance - Diarrhea with eggs.  Continue eating baked eggs.   Oral Allergy Syndrome- Fresh Fruits  - These symptoms are typically not life-threatening and are because of a cross reaction between a pollen you are allergic to, and to a protein in specific foods (such as fresh fruits, vegetables, and nuts). - If you can eat these things and tolerate the symptoms, it is fine to continue to do so.  If not, you may avoid these fresh fruits and vegetables.   - Heating these foods, buying them canned, and peeling these foods should allow them to be consumed  without symptoms or with less symptoms. - Patients typically report itching and/or mild swelling of the mouth and throat immediately following ingestion of certain uncooked fruits (including nuts) or raw vegetables.  - Only a very small number of affected individuals experience systemic allergic reactions, such as anaphylaxis which occurs with true food allergies.

## 2024-02-14 NOTE — Progress Notes (Signed)
 FOLLOW UP Date of Service/Encounter:  02/14/24   Subjective:  Heather Carey (DOB: Jul 19, 1950) is a 73 y.o. female who [facility] for follow up for skin testing.   History obtained from: chart review and patient.  Anti histamines held.   Past Medical History: Past Medical History:  Diagnosis Date   Diverticulitis    Eosinophilic esophagitis    Fibromyalgia    GERD (gastroesophageal reflux disease)    Hyperlipidemia    Hypothyroidism    IBS (irritable bowel syndrome)    Migraines    Mitral regurgitation 02/28/2014   Mild to moderate by echo 02/2014   Obesity    Osteoarthritis    Otitis externa    Palpitations    TIA (transient ischemic attack)     Objective:  There were no vitals taken for this visit. There is no height or weight on file to calculate BMI. Physical Exam: GEN: alert, well developed HEENT: clear conjunctiva, MMM LUNGS: unlabored respiration Skin Testing:  Skin prick testing was placed, which includes aeroallergens/foods, histamine control, and saline control.  Verbal consent was obtained prior to placing test.  Patient tolerated procedure well.  Allergy testing results were read and interpreted by myself, documented by clinical staff. Adequate positive and negative control.  Positive results to:  Results discussed with patient/family.  Airborne Adult Perc - 02/14/24 0951     Time Antigen Placed 9048    Allergen Manufacturer Jestine    Location Back    Number of Test 55    2. Control-Histamine 3+    3. Bahia Negative    4. Bermuda Negative    5. Johnson Negative    6. [place]  Blue Negative    7. Meadow Fescue Negative    8. Perennial Rye Negative    9. Timothy Negative    10. Ragweed Mix Negative    11. Cocklebur Negative    12. Plantain,  English Negative    13. Baccharis Negative    14. Dog Fennel Negative    15. Russian Thistle Negative    16. Lamb's Quarters Negative    17. Sheep Sorrell  Negative    18. Rough Pigweed Negative    19. Marsh Elder, Rough Negative    20. Mugwort, Common Negative    21. Box, Elder Negative    22. Cedar, red Negative    23. Sweet Gum Negative    24. Pecan Pollen Negative    25. Pine Mix Negative    26. Walnut, Black Pollen Negative    27. Red Mulberry Negative    28. Ash Mix Negative    29. Birch Mix Negative    30. Beech American Negative    31. Cottonwood, Eastern Negative    32. Hickory, White Negative    33. Maple Mix Negative    34. Oak, Eastern Mix Negative    35. Sycamore Eastern Negative    36. Alternaria Alternata Negative    37. Cladosporium Herbarum Negative    38. Aspergillus Mix Negative    39. Penicillium Mix Negative    40. Bipolaris Sorokiniana (Helminthosporium) Negative    41. Drechslera Spicifera (Curvularia) Negative    42. Mucor Plumbeus Negative    43. Fusarium Moniliforme Negative    44. Aureobasidium Pullulans (pullulara) Negative    45. Rhizopus Oryzae Negative    46. Botrytis Cinera Negative    47. Epicoccum Nigrum Negative    48. Phoma Betae Negative    49. Dust  Mite Mix Negative    50. Cat Hair 10,000 BAU/ml Negative    51.  Dog Epithelia Negative    52. Mixed Feathers Negative    53. Horse Epithelia Negative    54. Burma Nestle Negative          13 Food Perc - 02/14/24 0951       Test Information   Time Antigen Placed 9048    Allergen Manufacturer Jestine    Location Back    Number of allergen test 13      Food   1. Peanut  Negative    2. Soybean Negative    3. Wheat Negative    4. Sesame Negative    5. Milk, Cow Negative    6. Casein Negative    7. Egg White, Chicken Negative    8. Shellfish Mix Negative    9. Fish Mix Negative    10. Cashew Negative    11. Walnut Food Negative    12. Almond Negative    13. Hazelnut Negative           Assessment:   1. Other allergic rhinitis   2. Allergic urticaria     Plan/Recommendations:   Other Allergic Rhinitis: - Due to  turbinate hypertrophy, seasonal flare ups, recurrent hives and unresponsive to over the counter meds, will perform skin testing to identify aeroallergen triggers.   - SPT 02/2024: negative  - Use nasal saline rinses before nose sprays such as with Neilmed Sinus Rinse.  Use distilled water.   - Use Flonase  1-2 sprays each nostril daily. Aim upward and outward. - Use Zyrtec  10 mg daily.   Urticaria/Angioedema (Hives/Swelling): - SPT 02/2024: negative for foods and aeroallergens  - At this time etiology of hives and swelling is unknown. Hives can be caused by a variety of different triggers including illness/infection, pressure, vibrations, extremes of temperature to name a few however majority of the time there is no identifiable trigger.  - Use Zyrtec  10mg  daily. - If hives/swelling recur, increase to Zyrtec  10mg  twice daily.   Food Allergy:  - please strictly avoid shellfish, peanut , treenut,  Discussed avoidance due to her symptoms with the foods, although not convinced these are true food allergies in setting of negative testing.  - SPT 02/2024: negative for shellfish/peanut aldo.  - Initial rxn: throat swelling and hives  - for SKIN only reaction, okay to take Benadryl  25mg  capsules every 6 hours as needed - for SKIN + ANY additional symptoms, OR IF concern for LIFE THREATENING reaction = Epipen  Autoinjector EpiPen  0.3 mg. - If using Epinephrine  autoinjector, call 911 or go to the ER.   Chronic cough/Chest Tightness - Follow up with Pulm.  -Continue Qvar  40mcg 2 puffs twice daily.  Brush, rinse mouth and gargle after use.    Penicillin Allergy Cefdinir Allergy  - Continue avoidance. Attempted challenge but was unsuccessful; although her symptoms maybe related to POTS rather than a reaction.    Eosinophilic Esophagitis? - Will obtain records from [facility] regarding most recent notes, EGD and pathology. EGD in 2008 with lower esophageal ring s/p dilation. Have not received it.   -  Agree with restarting PPI and Pepcid  as recommended by GI.   Oral Allergy Syndrome- Fresh Fruits  - These symptoms are typically not life-threatening and are because of a cross reaction between a pollen you are allergic to, and to a protein in specific foods (such as fresh fruits, vegetables, and nuts). - If you can eat these things  and tolerate the symptoms, it is fine to continue to do so.  If not, you may avoid these fresh fruits and vegetables.   - Heating these foods, buying them canned, and peeling these foods should allow them to be consumed without symptoms or with less symptoms. - Patients typically report itching and/or mild swelling of the mouth and throat immediately following ingestion of certain uncooked fruits (including nuts) or raw vegetables.  - Only a very small number of affected individuals experience systemic allergic reactions, such as anaphylaxis which occurs with true food allergies.      Return in about 6 months (around 08/13/2024).  Arleta Blanch, MD [facility] 

## 2024-02-22 ENCOUNTER — Other Ambulatory Visit: Payer: Self-pay

## 2024-02-22 ENCOUNTER — Emergency Department (HOSPITAL_BASED_OUTPATIENT_CLINIC_OR_DEPARTMENT_OTHER)

## 2024-02-22 ENCOUNTER — Encounter (HOSPITAL_BASED_OUTPATIENT_CLINIC_OR_DEPARTMENT_OTHER): Payer: Self-pay

## 2024-02-22 ENCOUNTER — Emergency Department (HOSPITAL_BASED_OUTPATIENT_CLINIC_OR_DEPARTMENT_OTHER)
Admission: EM | Admit: 2024-02-22 | Discharge: 2024-02-22 | Disposition: A | Discharge status: Home / Self Care | Attending: Emergency Medicine | Admitting: Emergency Medicine

## 2024-02-22 ENCOUNTER — Emergency Department (HOSPITAL_BASED_OUTPATIENT_CLINIC_OR_DEPARTMENT_OTHER): Admitting: Radiology

## 2024-02-22 DIAGNOSIS — M25512 Pain in left shoulder: Secondary | ICD-10-CM | POA: Diagnosis not present

## 2024-02-22 DIAGNOSIS — M25572 Pain in left ankle and joints of left foot: Secondary | ICD-10-CM | POA: Diagnosis not present

## 2024-02-22 DIAGNOSIS — R519 Headache, unspecified: Secondary | ICD-10-CM | POA: Insufficient documentation

## 2024-02-22 DIAGNOSIS — W01198A Fall on same level from slipping, tripping and stumbling with subsequent striking against other object, initial encounter: Secondary | ICD-10-CM | POA: Diagnosis not present

## 2024-02-22 DIAGNOSIS — M542 Cervicalgia: Secondary | ICD-10-CM | POA: Diagnosis not present

## 2024-02-22 DIAGNOSIS — M25552 Pain in left hip: Secondary | ICD-10-CM | POA: Diagnosis not present

## 2024-02-22 DIAGNOSIS — W19XXXA Unspecified fall, initial encounter: Secondary | ICD-10-CM

## 2024-02-22 LAB — CBC WITH DIFFERENTIAL/PLATELET
Abs Immature Granulocytes: 0.03 K/uL (ref 0.00–0.07)
Basophils Absolute: 0 K/uL (ref 0.0–0.1)
Basophils Relative: 1 %
Eosinophils Absolute: 0.3 K/uL (ref 0.0–0.5)
Eosinophils Relative: 5 %
HCT: 37.1 % (ref 36.0–46.0)
Hemoglobin: 12.6 g/dL (ref 12.0–15.0)
Immature Granulocytes: 0 %
Lymphocytes Relative: 32 %
Lymphs Abs: 2.3 K/uL (ref 0.7–4.0)
MCH: 31 pg (ref 26.0–34.0)
MCHC: 34 g/dL (ref 30.0–36.0)
MCV: 91.4 fL (ref 80.0–100.0)
Monocytes Absolute: 0.6 K/uL (ref 0.1–1.0)
Monocytes Relative: 8 %
Neutro Abs: 4.1 K/uL (ref 1.7–7.7)
Neutrophils Relative %: 54 %
Platelets: 220 K/uL (ref 150–400)
RBC: 4.06 MIL/uL (ref 3.87–5.11)
RDW: 12.7 % (ref 11.5–15.5)
WBC: 7.3 K/uL (ref 4.0–10.5)
nRBC: 0 % (ref 0.0–0.2)

## 2024-02-22 LAB — BASIC METABOLIC PANEL WITH GFR
Anion gap: 9 (ref 5–15)
BUN: 14 mg/dL (ref 8–23)
CO2: 26 mmol/L (ref 22–32)
Calcium: 9.5 mg/dL (ref 8.9–10.3)
Chloride: 104 mmol/L (ref 98–111)
Creatinine, Ser: 0.94 mg/dL (ref 0.44–1.00)
GFR, Estimated: >60 mL/min (ref >60)
Glucose, Bld: 98 mg/dL (ref 70–99)
Potassium: 3.9 mmol/L (ref 3.5–5.1)
Sodium: 139 mmol/L (ref 135–145)

## 2024-02-22 MED ORDER — ACETAMINOPHEN 500 MG PO TABS
1000.0000 mg | ORAL_TABLET | Freq: Once | ORAL | Status: AC
  Administered 2024-02-22: 1000 mg via ORAL
  Filled 2024-02-22: qty 2

## 2024-02-22 NOTE — ED Triage Notes (Addendum)
 Patient was getting clothes out of the dryer when she stood up, got dizzy, but proceded to carry the clothes to the bedroom when she fell on her left side striking her head, shoulder, leg, and ankle. -LOC and not anticoagulated.

## 2024-02-22 NOTE — Discharge Instructions (Signed)
 Your images look okay.  Likely you will hurt worse tomorrow.  That is normal.  Please follow-up with your family doctor in clinic.  Please return for sudden worsening headache confusion or vomiting. Max OTC medicine dosing listed below Take 4 over the counter ibuprofen tablets 3 times a day or 2 over-the-counter naproxen tablets twice a day for pain. Also take tylenol  1000mg (2 extra strength) four times a day.

## 2024-02-22 NOTE — ED Notes (Signed)
 Reviewed discharge instructions and home care with pt. Pt verbalized understanding and had no further questions. Pt exited ED without complications.

## 2024-02-22 NOTE — ED Notes (Signed)
Patient transported to X-ray & CT °

## 2024-02-22 NOTE — ED Provider Notes (Signed)
 [place] EMERGENCY DEPARTMENT AT [facility] Provider Note   CSN: [id number] Arrival date & time: 02/22/24  1147     Patient presents with: Fall, Head Injury, Ankle Pain, and Arm Pain   Heather Carey is a 73 y.o. female.   73 yo F with a chief complaint of a fall.  Patient says she was putting things into the dryer and when she stood up she felt a bit lightheaded and she tripped over some shoes and fell and struck the back of her head.  She is complaining mostly of headache left-sided neck pain.  Also states that she has pain to her left ankle left shoulder and left hip.  Has been able to ambulate since.  No chest pain or difficulty breathing.  No blood thinner use.   Fall  Head Injury Ankle Pain Arm Pain       Prior to Admission medications   Medication Sig Start Date End Date Taking? Authorizing Provider  acetaminophen  (TYLENOL ) 500 MG tablet Take 1,000 mg by mouth 2 (two) times daily as needed (migraine headaches).    [provider]  beclomethasone (QVAR  REDIHALER) 40 MCG/ACT inhaler Inhale 2 puffs into the lungs 2 (two) times daily. 01/03/24   Baral, Dipti, MD  cetirizine  (ZYRTEC  ALLERGY) 10 MG tablet Take 1 tablet (10 mg total) by mouth 2 (two) times daily as needed for allergies (or hives). 06/21/23   Tobie Arleta SQUIBB, MD  diphenhydrAMINE  (BENADRYL ) 12.5 MG chewable tablet Chew 25 mg by mouth 2 (two) times daily as needed (allergic reaction).    [provider]  EPINEPHrine  0.3 mg/0.3 mL IJ SOAJ injection Inject 0.3 mg into the muscle as needed for anaphylaxis. 06/21/23   Tobie Arleta SQUIBB, MD  hydrocortisone  cream 0.5 % Apply 1 application. topically 2 (two) times daily as needed for itching (bug bites).    [provider]  levothyroxine  (SYNTHROID ) 88 MCG tablet Take 88 mcg by mouth daily before breakfast.    [provider]  metoprolol  succinate (TOPROL -XL) 25 MG 24 hr tablet TAKE 1/2 TABLET (12.5 MG TOTAL) BY MOUTH 2 (TWO) TIMES  DAILY. 01/08/24   Waddell Danelle ORN, MD  nitroGLYCERIN  (NITROSTAT ) 0.4 MG SL tablet Place 1 tablet (0.4 mg total) under the tongue every 5 (five) minutes as needed for chest pain. 12/14/21 01/03/24  Leverne Charlies Helling, PA-C  pantoprazole  (PROTONIX ) 40 MG tablet Take 40 mg by mouth 2 (two) times daily. 12/11/23   [provider]  triamcinolone cream (KENALOG) 0.1 % Apply 1 Application topically as needed. 08/08/23   [provider]    Allergies: Azithromycin, Codeine, Ivp dye [iodinated contrast media], Natamycin, Other, Rose, Shellfish allergy, Thimerosol [thimerosal (thiomersal)], Adhesive [tape], Avelox [moxifloxacin hcl in nacl], Bacitracin, Bacitracin-polymyxin b, Definity  [perflutren  lipid microsphere], Doxycycline, Ginger, Neomycin, Onion, Raspberry, Sunscreens, Sunscreens [aminobenzoate (paba)], Watermelon [citrullus vulgaris], Benzalkonium chloride, Cephalosporins, and Penicillins    Review of Systems  Updated Vital Signs BP 135/86 (BP Location: Right Arm)   Pulse 85   Temp 97.9 F (36.6 C) (Oral)   Resp 17   SpO2 100%   Physical Exam Vitals and nursing note reviewed.  Constitutional:      General: She is not in acute distress.    Appearance: She is well-developed. She is not diaphoretic.  HENT:     Head: Normocephalic and atraumatic.  Eyes:     Pupils: Pupils are equal, round, and reactive to light.  Cardiovascular:     Rate and Rhythm: Normal rate and  regular rhythm.     Heart sounds: No murmur heard.    No friction rub. No gallop.  Pulmonary:     Effort: Pulmonary effort is normal.     Breath sounds: No wheezing or rales.  Abdominal:     General: There is no distension.     Palpations: Abdomen is soft.     Tenderness: There is no abdominal tenderness.  Musculoskeletal:        General: No tenderness.     Cervical back: Normal range of motion and neck supple.     Comments: Pain mostly along the left trapezius muscle belly.  No obvious midline spinal  tenderness step-offs or deformities.  Able to rotate her head 45 degrees in the direction without pain.  Able to internally and externally rotate the left hip.  No obvious pain at the medial or lateral malleolus on the left.  Pulse motor and sensation intact.  Full range of motion of left shoulder without obvious discomfort.  Palpated from head to toe without any other obvious noted areas of bony tenderness.  Skin:    General: Skin is warm and dry.  Neurological:     Mental Status: She is alert and oriented to person, place, and time.  Psychiatric:        Behavior: Behavior normal.     (all labs ordered are listed, but only abnormal results are displayed) Labs Reviewed  CBC WITH DIFFERENTIAL/PLATELET  BASIC METABOLIC PANEL WITH GFR    EKG: None  Radiology: CT Cervical Spine Wo Contrast Result Date: 02/22/2024 EXAM: CT CERVICAL SPINE WITHOUT CONTRAST 02/22/2024 12:49:01 PM TECHNIQUE: CT of the cervical spine was performed without the administration of intravenous contrast. Multiplanar reformatted images are provided for review. Automated exposure control, iterative reconstruction, and/or weight based adjustment of the mA/kV was utilized to reduce the radiation dose to as low as reasonably achievable. COMPARISON: 04/21/2022 CLINICAL HISTORY: fall FINDINGS: CERVICAL SPINE: BONES AND ALIGNMENT: No acute fracture or traumatic malalignment. Straightening of the normal cervical lordosis. DEGENERATIVE CHANGES: Degenerative endplate osteophytes most pronounced at C3-C4. There is mild disc space narrowing at C5-C6 and mild to moderate disc space narrowing at C6-C7. Degenerative changes at C1-C2. Facet arthrosis and uncovertebral hypertrophy at multiple levels. There is no high grade osseous spinal canal stenosis. Foraminal stenosis most pronounced at C5-C6 and C6-C7. SOFT TISSUES: No prevertebral soft tissue swelling. Atherosclerosis of the carotid bifurcations. IMPRESSION: 1. No acute abnormality of  the cervical spine related to the fall. 2. Multilevel degenerative changes as above. Electronically signed by: Donnice Mania MD 02/22/2024 01:08 PM EST RP Workstation: [id number]   CT Head Wo Contrast Result Date: 02/22/2024 EXAM: CT HEAD WITHOUT CONTRAST 02/22/2024 12:49:01 PM TECHNIQUE: CT of the head was performed without the administration of intravenous contrast. Automated exposure control, iterative reconstruction, and/or weight based adjustment of the mA/kV was utilized to reduce the radiation dose to as low as reasonably achievable. COMPARISON: 04/21/2022 CLINICAL HISTORY: fall FINDINGS: BRAIN AND VENTRICLES: No acute hemorrhage. No evidence of acute infarct. No hydrocephalus. No extra-axial collection. No mass effect or midline shift. ORBITS: No acute abnormality. SINUSES: No acute abnormality. SOFT TISSUES AND SKULL: No acute soft tissue abnormality. No skull fracture. IMPRESSION: 1. No acute intracranial abnormality. Electronically signed by: Donnice Mania MD 02/22/2024 01:03 PM EST RP Workstation: [id number]     Procedures   Medications Ordered in the ED - No data to display  Medical Decision Making Amount and/or Complexity of Data Reviewed Labs: ordered. Radiology: ordered. ECG/medicine tests: ordered.  Risk OTC drugs.   74 yo F with a chief complaints of a fall.  Patient does give some concern to near syncope.  Will obtain blood work.  EKG.  Blood work without anemia, no significant electrolyte abnormalities.  CT of the head and C-spine without obvious acute intracranial intraspinal pathology.  Plain film of the left shoulder independently interpreted by me without fracture.  Plain film of the left hip independently interpreted by me without fracture.  Plain film the left ankle independently interpreted by me without fracture. I discussed the results with the patient.  Will discharge home.  2:42 PM:  I have discussed the  diagnosis/risks/treatment options with the patient.  Evaluation and diagnostic testing in the emergency department does not suggest an emergent condition requiring admission or immediate intervention beyond what has been performed at this time.  They will follow up with PCP. We also discussed returning to the ED immediately if new or worsening sx occur. We discussed the sx which are most concerning (e.g., sudden worsening pain, fever, inability to tolerate by mouth) that necessitate immediate return. Medications administered to the patient during their visit and any new prescriptions provided to the patient are listed below.  Medications given during this visit Medications  acetaminophen  (TYLENOL ) tablet 1,000 mg (1,000 mg Oral Given 02/22/24 1410)     The patient appears reasonably screen and/or stabilized for discharge and I doubt any other medical condition or other [facility] requiring further screening, evaluation, or treatment in the ED at this time prior to discharge.       Final diagnoses:  None    ED Discharge Orders     None          Emil Share, DO 02/22/24 1442

## 2024-03-22 ENCOUNTER — Other Ambulatory Visit: Payer: Self-pay

## 2024-03-22 ENCOUNTER — Ambulatory Visit: Admitting: Internal Medicine

## 2024-03-22 VITALS — BP 116/84 | HR 83 | Temp 98.3°F | Ht 64.75 in | Wt 200.4 lb

## 2024-03-22 DIAGNOSIS — J310 Chronic rhinitis: Secondary | ICD-10-CM

## 2024-03-22 DIAGNOSIS — K9049 Malabsorption due to intolerance, not elsewhere classified: Secondary | ICD-10-CM

## 2024-03-22 DIAGNOSIS — L508 Other urticaria: Secondary | ICD-10-CM | POA: Diagnosis not present

## 2024-03-22 DIAGNOSIS — R09A2 Foreign body sensation, throat: Secondary | ICD-10-CM

## 2024-03-22 NOTE — Patient Instructions (Addendum)
 Chronic Rhinitis    - SPT 02/2024: negative  - Use nasal saline rinses before nose sprays such as with Neilmed Sinus Rinse.  Use distilled water.   - Use Nasacort or Nasonex or Rhinocort 2 sprays each nostril daily. Aim upward and outward. - Use Zyrtec  10 mg daily.  - Will refer to ENT.     Urticaria/Angioedema (Hives/Swelling): - SPT 02/2024: negative for commonly allergenic foods and aeroallergens  - At this time etiology of hives and swelling is unknown. Hives can be caused by a variety of different triggers including illness/infection, pressure, vibrations, extremes of temperature to name a few however majority of the time there is no identifiable trigger.  - Use Zyrtec  10mg  daily. - If hives/swelling recur, increase to Zyrtec  10mg  twice daily.    Food Allergy :  - please strictly avoid shellfish, peanut , treenut,  Discussed avoidance due to her symptoms with the foods, although not convinced these are true food allergies in setting of negative testing.  - for SKIN only reaction, okay to take Benadryl  25mg  capsules every 6 hours as needed - for SKIN + ANY additional symptoms, OR IF concern for LIFE THREATENING reaction = Epipen  Autoinjector EpiPen  0.3 mg. - If using Epinephrine  autoinjector, call 911 or go to the ER.    Penicillin Allergy  Cefdinir Allergy   - Continue avoidance.   Chronic cough/Chest Tightness - Follow up with Pulm.  -Continue Qvar  40mcg 2 puffs twice daily.  Brush, rinse mouth and gargle after use.      Eosinophilic Esophagitis? - Follow up with [facility]. On PPI and Pepcid .

## 2024-03-22 NOTE — Progress Notes (Addendum)
 FOLLOW UP Date of Service/Encounter:  03/22/2024   Subjective:  Heather Carey (DOB: 1950/06/21) is a 73 y.o. female who [facility] for follow up for chronic rhinitis, chronic hives, food intolerances, hx of drug allergies    History obtained from: chart review and patient. Last seen for testing 02/14/2024 and at the time, aeroallergens and foods were negative.  Discussed use of Flonase /Zyrtec .  Notes have sore throat, ear pressure/pain, congestion, post nasal drainage and went to urgent care. Started her on clindamycin and she did not take it as she noted side effects of esophageal ulcers. Reports still with symptoms but denies any high fevers or sinus pressure.  Has not started the Flonase  due to hx of reactions to Benzalkonium Chloride.  Has not used Zyrtec  either.   Notes random episodes of hives and also feeling of throat swelling. Not taking Zyrtec . She is worried it is a food allergy ; have done commonly allergenic food testing in the past.  Avoiding all nuts and shellfish. Interested in retesting for alpha gal.  Does not eat red meats much except bacon here and there.   Has never seen ENT prior.  Also notes having a POTS episode recently; thought it was an allergic reaction but then started with tachycardia.    Past Medical History: Past Medical History:  Diagnosis Date   Diverticulitis    Eosinophilic esophagitis    Fibromyalgia    GERD (gastroesophageal reflux disease)    Hyperlipidemia    Hypothyroidism    IBS (irritable bowel syndrome)    Migraines    Mitral regurgitation 02/28/2014   Mild to moderate by echo 02/2014   Obesity    Osteoarthritis    Otitis externa    Palpitations    TIA (transient ischemic attack)     Objective:  BP 116/84 (BP Location: Right Arm, Patient Position: Sitting, Cuff Size: Normal)   Pulse 83   Temp 98.3 F (36.8 C) (Temporal)   Ht 5' 4.75 (1.645 m)   Wt 200 lb 6.4 oz (90.9 kg)   SpO2 98%    BMI 33.61 kg/m  Body mass index is 33.61 kg/m. Physical Exam: GEN: alert, well developed HEENT: clear conjunctiva, nose without inferior turbinate hypertrophy, pink nasal mucosa, no rhinorrhea, + cobblestoning, TM without erythema, bulging HEART: regular rate and rhythm, no murmur LUNGS: clear to auscultation bilaterally, no coughing, unlabored respiration SKIN: no rashes or lesions  Assessment:   1. Chronic rhinitis   2. Chronic urticaria   3. Food intolerance     Plan/Recommendations:  Chronic Rhinitis    - Uncontrolled  - SPT 02/2024: negative  - Use nasal saline rinses before nose sprays such as with Neilmed Sinus Rinse.  Use distilled water.   - Use Nasacort or Nasonex or Rhinocort 2 sprays each nostril daily. Aim upward and outward. See which one does not have Benzalkonium Chloride.  - Use Zyrtec  10 mg daily.  - Will refer to ENT to further evaluation for chronic rhinitis, ear issues and globus sensation.    Urticaria/Angioedema (Hives/Swelling): - Uncontrolled. - SPT 02/2024: negative for commonly allergenic foods and aeroallergens  - will recheck alpha gal  - At this time etiology of hives and swelling is unknown. Hives can be caused by a variety of different triggers including illness/infection, pressure, vibrations, extremes of temperature to name a few however majority of the time there is no identifiable trigger.  - Use Zyrtec  10mg  daily. - If hives/swelling recur, increase  to Zyrtec  10mg  twice daily.    Food Allergy :  - please strictly avoid shellfish, peanut , treenut,  Discussed avoidance due to her symptoms with the foods, although not convinced these are true food allergies in setting of negative testing.  - SPT 02/2024: negative for shellfish/peanut aldo.  - Initial rxn: throat swelling and hives  - for SKIN only reaction, okay to take Benadryl  25mg  capsules every 6 hours as needed - for SKIN + ANY additional symptoms, OR IF concern for LIFE THREATENING  reaction = Epipen  Autoinjector EpiPen  0.3 mg. - If using Epinephrine  autoinjector, call 911 or go to the ER.    Penicillin Allergy  Cefdinir Allergy   - Continue avoidance. Attempted challenge but was unsuccessful; although her symptoms maybe related to POTS rather than a reaction.    Chronic cough/Chest Tightness - Follow up with Pulm.  -Continue Qvar  40mcg 2 puffs twice daily.  Brush, rinse mouth and gargle after use.      Eosinophilic Esophagitis? - Follow up with [facility]. On PPI and Pepcid .      Return in about 6 months (around 09/20/2024).  Arleta Blanch, MD [facility] 

## 2024-03-25 ENCOUNTER — Ambulatory Visit: Payer: Self-pay | Admitting: Internal Medicine

## 2024-03-25 LAB — ALPHA-GAL PANEL
Allergen Lamb IgE: <0.1 kU/L
Beef IgE: <0.1 kU/L
IgE (Immunoglobulin E), Serum: 12 [IU]/mL (ref 6–495)
O215-IgE Alpha-Gal: <0.1 kU/L
Pork IgE: <0.1 kU/L

## 2024-03-25 NOTE — Addendum Note (Signed)
 Addended by: Jalil Lorusso on: 03/25/2024 08:47 AM   Modules accepted: Orders

## 2024-03-27 ENCOUNTER — Telehealth: Payer: Self-pay | Admitting: Internal Medicine

## 2024-03-27 NOTE — Telephone Encounter (Signed)
 Heather Carey has been referred to [facility] and they will reach out to the pt to schedule.  I will follow up in a week.

## 2024-03-29 NOTE — Telephone Encounter (Signed)
 [name] has been scheduled with ENT for 04/24/24 at 9:30 am with Dr. Anice

## 2024-04-08 ENCOUNTER — Ambulatory Visit

## 2024-04-08 ENCOUNTER — Encounter

## 2024-04-23 ENCOUNTER — Encounter

## 2024-04-23 ENCOUNTER — Ambulatory Visit

## 2024-04-24 ENCOUNTER — Ambulatory Visit (INDEPENDENT_AMBULATORY_CARE_PROVIDER_SITE_OTHER)

## 2024-04-24 ENCOUNTER — Encounter (INDEPENDENT_AMBULATORY_CARE_PROVIDER_SITE_OTHER): Payer: Self-pay

## 2024-04-24 VITALS — BP 104/71 | HR 72 | Wt 200.0 lb

## 2024-04-24 DIAGNOSIS — R1310 Dysphagia, unspecified: Secondary | ICD-10-CM | POA: Diagnosis not present

## 2024-04-24 DIAGNOSIS — J029 Acute pharyngitis, unspecified: Secondary | ICD-10-CM

## 2024-04-24 DIAGNOSIS — H903 Sensorineural hearing loss, bilateral: Secondary | ICD-10-CM | POA: Diagnosis not present

## 2024-04-24 DIAGNOSIS — Z974 Presence of external hearing-aid: Secondary | ICD-10-CM | POA: Diagnosis not present

## 2024-04-24 DIAGNOSIS — K219 Gastro-esophageal reflux disease without esophagitis: Secondary | ICD-10-CM

## 2024-04-24 DIAGNOSIS — R0981 Nasal congestion: Secondary | ICD-10-CM

## 2024-04-24 DIAGNOSIS — J342 Deviated nasal septum: Secondary | ICD-10-CM

## 2024-04-24 MED ORDER — FLONASE SENSIMIST 27.5 MCG/SPRAY NA SUSP: 2.0000 | Freq: Every day | NASAL | 12 refills | Status: AC

## 2024-04-24 NOTE — Patient Instructions (Addendum)
" °  VISIT SUMMARY: During today's visit, we discussed your persistent sore throat, difficulty swallowing, and nasal congestion. 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 We also addressed your symptoms related to laryngopharyngeal reflux, nasal obstruction, and hearing loss.  YOUR PLAN: -LARYNGOPHARYNGEAL REFLUX: Laryngopharyngeal reflux is a condition where stomach acid flows back into the throat, causing irritation and swelling. You are advised to take omeprazole daily for two months to manage your symptoms. If your symptoms persist, you may increase the dose to twice daily. We will follow up in two months to assess your response. Please continue your follow-up with gastroenterology for your esophageal ulcers and eosinophilic esophagitis.  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 You are recommended to try Flonase  Sensimist nasal spray, two sprays daily. Please confirm the ingredients with your pharmacist due to your allergy  concerns.  -SENSORINEURAL HEARING LOSS, BILATERAL: Sensorineural hearing loss is a type of hearing loss resulting from damage to the inner ear or the nerve pathways from the inner ear to the brain. You are advised to continue routine follow-ups with your audiologist and report any changes in your hearing. We have requested your most recent audiogram for review.  INSTRUCTIONS: Please follow up in two months to assess your response to the omeprazole treatment. Continue your follow-up with gastroenterology for your esophageal ulcers and eosinophilic esophagitis. Confirm the ingredients of Flonase  Sensimist with your pharmacist before use. Continue routine follow-ups with your audiologist and report any changes in your  hearing.                      Contains text generated by [employer].                                 Contains text generated by [employer]bridge.   "

## 2024-04-24 NOTE — Progress Notes (Signed)
 Dear Dr. Tobie, Here is my assessment for our mutual patient, Heather Carey. Thank you for allowing me the opportunity to care for your patient. Please do not hesitate to contact me should you have any other questions. Sincerely, Dr. Penne Croak  Otolaryngology Clinic Note Referring provider: Dr. Tobie HPI:  Discussed the use of AI scribe software for clinical note transcription with the patient, who gave verbal consent to proceed.  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  Dysphagia and Esophageal Symptoms: - Longstanding dysphagia with frequent episodes of choking, especially after eating in restaurants - Characterized by formation of large phlegm bubbles in the throat and repeated attempts to clear airway - Symptoms improved with highly restricted diet and avoidance of triggering foods, though occasional episodes persist - Remains on a limited diet - Documented esophageal ulcers; two endoscopies have shown worsening findings - Scheduled for follow-up with gastroenterology  Pharyngeal and Laryngeal Symptoms: - Intermittent sore throat and chronic hoarseness - Symptoms exacerbated during periods of Qvar  (beclomethasone) inhaler use - Partial improvement after discontinuation of Qvar , though mild throat discomfort persists - Diligent oral hygiene after inhaler use, but recurrent sore throat has occurred - History of possible thrush related to inhaled steroids - Persistent throat swelling and globus sensation - Inconsistent use of omeprazole due to concerns about side effects - Ongoing symptoms of laryngopharyngeal reflux  Nasal Obstruction and Sinus Symptoms: - Chronic nasal congestion due to deviated septum - Recurrent left-sided sinus infections, particularly  following viral illnesses (three episodes of COVID-19 since 2021; influenza once or twice since 2021, most recently during the week of Christmas) - Unable to tolerate standard intranasal steroids due to allergy  to benzalkonium chloride, which causes facial and hand dermatitis - Has not used Flonase  or similar products; seeking alternatives for nasal symptom management - No prior nasal endoscopy  Otologic Symptoms: - Remote history of chronic otitis media, including a prolonged episode approximately 20 years ago that resolved after tympanic membrane rupture - Rare ear infections currently; recent episode around time of flu or urgent care visit, now resolved - Bilateral sensorineural hearing loss managed with hearing aids for the past five years - Difficulty with hearing aid fit due to narrow ear canals  Sleep-Related and Nocturnal Symptoms: - Episodic nocturnal swelling of throat and face, with associated dyspnea and panic attacks, occurring approximately once a month or every few months - Twice tested negative for alpha-gal syndrome - Obstructive sleep apnea confirmed by sleep study about 1-1.5 years ago - Unable to tolerate CPAP or nasal mask due to facial swelling and discomfort - Currently manages symptoms by sleeping with three pillows - Experiences nocturnal awakenings with tachycardia - History of supraventricular tachycardia - Not currently using any therapy for sleep apnea    Independent Review of Additional Tests or Records:  Reviewed external note from referring PCP, Patel,describing relevant history incorporated into todays evaluation.   PMH/Meds/All/SocHx/FamHx/ROS:   Past Medical History:  Diagnosis Date   Diverticulitis    Eosinophilic esophagitis    Fibromyalgia    GERD (gastroesophageal reflux disease)    Hyperlipidemia    Hypothyroidism    IBS (irritable bowel syndrome)    Migraines    Mitral regurgitation 02/28/2014   Mild to moderate by echo 02/2014    Obesity    Osteoarthritis    Otitis  externa    Palpitations    TIA (transient ischemic attack)      Past Surgical History:  Procedure Laterality Date   ABDOMINAL HYSTERECTOMY     BREAST BIOPSY Left 11/02/2022   MM LT BREAST BX W LOC DEV 1ST LESION IMAGE BX SPEC STEREO GUIDE 11/02/2022 [facility]   carpel tunnel     lbp gso     LOOP RECORDER INSERTION N/A 05/08/2017   Procedure: LOOP RECORDER INSERTION;  Surgeon: Waddell Danelle ORN, MD;  Location: [facility];  Service: Cardiovascular;  Laterality: N/A;   TONSILLECTOMY AND ADENOIDECTOMY      Family History  Problem Relation Age of Onset   Allergic rhinitis Mother    Urticaria Mother    Allergic rhinitis Father    Asthma Father    Allergic rhinitis Sister    Angioedema Sister    Urticaria Sister    Allergic rhinitis Brother    Asthma Brother      Social Connections: Not on file     Current Medications[1]   Physical Exam:   BP 104/71 (BP Location: Left Arm, Patient Position: Sitting, Cuff Size: Normal)   Pulse 72   Wt 200 lb (90.7 kg)   SpO2 97%   BMI 33.54 kg/m   The patient was awake, alert, and appropriate. The external ears were inspected, and otoscopy was performed to evaluate the external auditory canals and tympanic membranes. The nasal cavity and septum were examined for mucosal changes, obstruction, or discharge. The oral cavity and oropharynx were inspected for mucosal lesions, infection, or tonsillar hypertrophy. The neck was palpated for lymphadenopathy, thyroid  abnormalities, or other masses. Cranial nerve function was grossly intact.  Pertinent Findings: General: Well developed, well nourished. No acute distress. Voice without hoarseness Head/Face: Normocephalic. No sinus tenderness. Facial nerve intact and equal bilaterally. No facial lacerations. Eyes: PERRL, no scleral icterus or conjunctival hemorrhage. EOMI. Ears: No gross deformity. Normal external canal. Tympanic membrane with normal  landmarks bilaterally Hearing: Normal speech reception.  Mouth/Oropharynx: Lips without any lesions. Dentition . No mucosal lesions within the oropharynx. No tonsillar enlargement, exudate, or lesions. Pharyngeal walls symmetrical. Uvula midline. Tongue midline without lesions. Larynx: See TFL if applicable Nasopharynx: See TFL if applicable Neck: Trachea midline. No masses. No thyromegaly or nodules palpated. No crepitus. Lymphatic: No lymphadenopathy in the neck. Respiratory: No stridor or distress. Room air. Cardiovascular: Regular rate and rhythm. Extremities: No edema or cyanosis. Warm and well-perfused. Skin: No scars or lesions on face or neck. Neurologic: CN II-XII grossly intact. Moving all extremities without gross abnormality. Other:  Physical Exam HEENT: Atraumatic, normocephalic. Nose with deviated septum exhibiting significant curvature, septum touching lateral wall creating a super narrow passage. Throat shows erythema and swelling, indicative of silent reflux.   Seprately Identifiable Procedures:  I personally ordered, reviewed and interpreted the following with the patient today  Given the patient's symptoms and incomplete visualization of critical sinonasal areas with anterior rhinoscopy, a separately performed diagnostic nasal endoscopy procedure is indicated for a complete rhinologic evaluation per American Rhinologic Society recommendations ([email or web address]://[email or web address]-[email or web address]/position-statements)  I personally ordered, reviewed and interpreted the following with the patient today  Procedure Note Diagnostic Nasal Endoscopy CPT CODE -- 68768 - Mod 25  Prior to initiating any procedures, risks/benefits/alternatives were explained to the patient and verbal consent obtained.  Pre-procedure diagnosis: Concern for obstruction vs infection Post-procedure diagnosis: same Indication: See pre-procedure diagnosis and physical exam above Complications: None apparent EBL: 0  mL Anesthesia: Lidocaine  4% and topical decongestant  was topically sprayed in each nasal cavity  Description of Procedure:  Patient was identified. A flexible fiberoptic endoscope was utilized to evaluate the sinonasal cavities, mucosa, sinus ostia and turbinates and septum.  Overall, signs of mucosal inflammation are noted.  Also noted are severe septal spur.  No mucopurulence, polyps, or masses noted.   Right Middle meatus: edema Right SE Recess: clear Left MM: edema Left SE Recess: clear Photodocumentation was obtained.  Procedure Note Pre-procedure diagnosis:  dysphagia  Post-procedure diagnosis: Same Procedure: Transnasal Fiberoptic Laryngoscopy, CPT 31575 - Mod 25 Indication: Dysphagia  Complications: None apparent EBL: 0 mL  The procedure was undertaken to further evaluate the patient's complaint of chocking, with mirror exam inadequate for appropriate examination due to gag reflex and poor patient tolerance  Procedure:  Patient was identified as correct patient. Verbal consent was obtained. The nose was sprayed with oxymetazoline and 4% lidocaine . The The flexible laryngoscope was passed through the nose to view the nasal cavity, pharynx (oropharynx, hypopharynx) and larynx.  The larynx was examined at rest and during multiple phonatory tasks. Documentation was obtained and reviewed with patient. The scope was removed. The patient tolerated the procedure well.  Findings: The nasal cavity and nasopharynx did not reveal any masses or lesions, mucosa appeared to be without obvious lesions. The tongue base, pharyngeal walls, piriform sinuses, vallecula, epiglottis and postcricoid region are normal in appearance EXCEPT: arytenoid pachydermia. The visualized portion of the subglottis and proximal trachea is widely patent. The vocal folds are mobile bilaterally. There are no lesions on the free edge of the vocal folds nor elsewhere in the larynx worrisome for malignancy.    Electronically  signed by: Penne Croak, DO 04/27/2024 12:48 PM   Impression & Plans:  Heather Carey is a 74 y.o. female  1. Sore throat   2. DNS (deviated nasal septum)   3. Nasal congestion   4. Laryngopharyngeal reflux (LPR)   5. Sensorineural hearing loss (SNHL) of both ears   6. Uses hearing aid    - Findings and diagnoses discussed in detail with the patient. - Risks, benefits, and alternatives were reviewed. Through shared decision making, the patient elects to proceed with below. Assessment & Plan Laryngopharyngeal reflux Chronic laryngopharyngeal reflux with persistent throat edema, hoarseness, and intermittent odynophagia. Laryngoscopy showed edema consistent with reflux. Non-adherence to omeprazole due to adverse effect concerns. - Recommended omeprazole daily for two months. - Instructed to initiate omeprazole once daily, with option to increase to twice daily if symptoms persist. - Discussed follow-up in two months to assess response. - Advised continued gastroenterology follow-up for esophageal ulcers and eosinophilic esophagitis.  - will defer workup to GI  Deviated nasal septum with chronic nasal congestion Severe septal deviation with chronic nasal congestion and recurrent rhinosinusitis. Hypersensitivity to intranasal steroids with benzalkonium chloride. Nasal endoscopy confirmed marked septal deviation. - Recommended trial of Flonase  Sensimist nasal spray, two sprays daily. - Instructed to confirm ingredients with pharmacist due to allergy  concerns.  Sensorineural hearing loss, bilateral Bilateral sensorineural hearing loss managed with hearing aids. Narrow external auditory canals may complicate otitis externa and hearing aid fit. - Requested most recent audiogram from audiologist for review. - Advised continued routine audiology follow-up and to report any changes in hearing.  - Orders placed: No orders of the defined types were placed in this encounter.  - Medications  prescribed/continued/adjusted:  Meds ordered this encounter  Medications   fluticasone  (FLONASE  SENSIMIST) 27.5 MCG/SPRAY nasal spray    Sig: Place 2 sprays into  the nose daily.    Dispense:  10 g    Refill:  12   - Education materials provided to the patient. - Follow up: Discuss GI symptoms and workup, consider swallow study if not improved. Assess flonase  efficacy, review audio. Patient instructed to return sooner or go to the ED if new/worsening symptoms develop.   Thank you for allowing me the opportunity to care for your patient. Please do not hesitate to contact me should you have any other questions.  Sincerely, Penne Croak, DO Otolaryngologist (ENT) [facility] Phone: [phone] Fax: [phone]  04/27/2024, 12:48 PM        [1]  Current Outpatient Medications:    acetaminophen  (TYLENOL ) 500 MG tablet, Take 1,000 mg by mouth 2 (two) times daily as needed (migraine headaches)., Disp: , Rfl:    beclomethasone (QVAR  REDIHALER) 40 MCG/ACT inhaler, Inhale 2 puffs into the lungs 2 (two) times daily., Disp: 1 each, Rfl: 5   cetirizine  (ZYRTEC  ALLERGY ) 10 MG tablet, Take 1 tablet (10 mg total) by mouth 2 (two) times daily as needed for allergies (or hives)., Disp: 60 tablet, Rfl: 5   diphenhydrAMINE  (BENADRYL  ALLERGY ) 25 mg capsule, Take 25 mg by mouth every 6 (six) hours as needed for itching., Disp: , Rfl:    EPINEPHrine  0.3 mg/0.3 mL IJ SOAJ injection, Inject 0.3 mg into the muscle as needed for anaphylaxis., Disp: 2 each, Rfl: 1   fluticasone  (FLONASE  SENSIMIST) 27.5 MCG/SPRAY nasal spray, Place 2 sprays into the nose daily., Disp: 10 g, Rfl: 12   hydrocortisone  cream 0.5 %, Apply 1 application. topically 2 (two) times daily as needed for itching (bug bites)., Disp: , Rfl:    metoprolol  succinate (TOPROL -XL) 25 MG 24 hr tablet, TAKE 1/2 TABLET (12.5 MG TOTAL) BY MOUTH 2 (TWO) TIMES DAILY., Disp: 90 tablet, Rfl: 2   nitroGLYCERIN  (NITROSTAT ) 0.4 MG SL  tablet, Place 1 tablet (0.4 mg total) under the tongue every 5 (five) minutes as needed for chest pain., Disp: 25 tablet, Rfl: 3   pantoprazole  (PROTONIX ) 40 MG tablet, Take 40 mg by mouth 2 (two) times daily., Disp: , Rfl:    triamcinolone cream (KENALOG) 0.1 %, Apply 1 Application topically as needed., Disp: , Rfl:

## 2024-06-05 ENCOUNTER — Ambulatory Visit (INDEPENDENT_AMBULATORY_CARE_PROVIDER_SITE_OTHER)

## 2024-08-14 ENCOUNTER — Ambulatory Visit: Admitting: Internal Medicine

## 2024-09-17 ENCOUNTER — Ambulatory Visit: Admitting: Allergy and Immunology
